# Patient Record
Sex: Female | Born: 1955
Health system: Southern US, Community
[De-identification: ages and names within clinical notes are randomized; demographics above are authoritative.]

## PROBLEM LIST (undated history)

## (undated) DIAGNOSIS — G4700 Insomnia, unspecified: Secondary | ICD-10-CM

## (undated) DIAGNOSIS — I10 Essential (primary) hypertension: Secondary | ICD-10-CM

## (undated) DIAGNOSIS — K219 Gastro-esophageal reflux disease without esophagitis: Secondary | ICD-10-CM

## (undated) DIAGNOSIS — R51 Headache: Secondary | ICD-10-CM

## (undated) DIAGNOSIS — T8859XA Other complications of anesthesia, initial encounter: Secondary | ICD-10-CM

## (undated) DIAGNOSIS — T4145XA Adverse effect of unspecified anesthetic, initial encounter: Secondary | ICD-10-CM

## (undated) DIAGNOSIS — E039 Hypothyroidism, unspecified: Secondary | ICD-10-CM

## (undated) DIAGNOSIS — M1990 Unspecified osteoarthritis, unspecified site: Secondary | ICD-10-CM

## (undated) DIAGNOSIS — F32A Depression, unspecified: Secondary | ICD-10-CM

## (undated) DIAGNOSIS — R112 Nausea with vomiting, unspecified: Secondary | ICD-10-CM

## (undated) DIAGNOSIS — R350 Frequency of micturition: Secondary | ICD-10-CM

## (undated) DIAGNOSIS — F329 Major depressive disorder, single episode, unspecified: Secondary | ICD-10-CM

## (undated) DIAGNOSIS — I341 Nonrheumatic mitral (valve) prolapse: Secondary | ICD-10-CM

## (undated) DIAGNOSIS — Z9889 Other specified postprocedural states: Secondary | ICD-10-CM

## (undated) HISTORY — PX: OTHER SURGICAL HISTORY: SHX169

## (undated) HISTORY — DX: Gastro-esophageal reflux disease without esophagitis: K21.9

## (undated) HISTORY — DX: Frequency of micturition: R35.0

## (undated) HISTORY — DX: Headache: R51

## (undated) HISTORY — PX: TUBAL LIGATION: SHX77

## (undated) HISTORY — DX: Insomnia, unspecified: G47.00

## (undated) HISTORY — PX: POLYPECTOMY: SHX149

## (undated) HISTORY — DX: Nonrheumatic mitral (valve) prolapse: I34.1

---

## 1999-10-17 HISTORY — PX: BREAST BIOPSY: SHX20

## 1999-10-17 HISTORY — PX: BREAST SURGERY: SHX581

## 2000-10-16 DIAGNOSIS — R112 Nausea with vomiting, unspecified: Secondary | ICD-10-CM

## 2000-10-16 DIAGNOSIS — Z9889 Other specified postprocedural states: Secondary | ICD-10-CM

## 2000-10-16 HISTORY — DX: Other specified postprocedural states: Z98.890

## 2000-10-16 HISTORY — DX: Other specified postprocedural states: R11.2

## 2001-10-16 DIAGNOSIS — I341 Nonrheumatic mitral (valve) prolapse: Secondary | ICD-10-CM

## 2001-10-16 HISTORY — DX: Nonrheumatic mitral (valve) prolapse: I34.1

## 2005-08-01 ENCOUNTER — Ambulatory Visit: Payer: Self-pay | Admitting: General Practice

## 2005-08-10 ENCOUNTER — Ambulatory Visit: Payer: Self-pay | Admitting: General Practice

## 2005-10-16 HISTORY — PX: KNEE ARTHROPLASTY: SHX992

## 2006-01-05 ENCOUNTER — Inpatient Hospital Stay: Payer: Self-pay | Admitting: General Practice

## 2006-10-16 HISTORY — PX: JOINT REPLACEMENT: SHX530

## 2006-10-16 HISTORY — PX: KNEE ARTHROPLASTY: SHX992

## 2007-04-09 ENCOUNTER — Ambulatory Visit: Payer: Self-pay | Admitting: General Practice

## 2007-04-22 ENCOUNTER — Inpatient Hospital Stay: Payer: Self-pay | Admitting: General Practice

## 2009-03-22 ENCOUNTER — Ambulatory Visit: Payer: Self-pay | Admitting: Unknown Physician Specialty

## 2012-09-23 ENCOUNTER — Encounter: Payer: Self-pay | Admitting: Adult Health

## 2012-09-23 ENCOUNTER — Telehealth: Payer: Self-pay | Admitting: *Deleted

## 2012-09-23 ENCOUNTER — Telehealth: Payer: Self-pay | Admitting: Adult Health

## 2012-09-23 ENCOUNTER — Ambulatory Visit (INDEPENDENT_AMBULATORY_CARE_PROVIDER_SITE_OTHER): Payer: BC Managed Care – PPO | Admitting: Adult Health

## 2012-09-23 VITALS — BP 132/91 | HR 64 | Temp 97.8°F | Ht 64.0 in | Wt 212.0 lb

## 2012-09-23 DIAGNOSIS — Z23 Encounter for immunization: Secondary | ICD-10-CM

## 2012-09-23 DIAGNOSIS — R071 Chest pain on breathing: Secondary | ICD-10-CM

## 2012-09-23 DIAGNOSIS — R52 Pain, unspecified: Secondary | ICD-10-CM

## 2012-09-23 DIAGNOSIS — G4700 Insomnia, unspecified: Secondary | ICD-10-CM | POA: Insufficient documentation

## 2012-09-23 DIAGNOSIS — K219 Gastro-esophageal reflux disease without esophagitis: Secondary | ICD-10-CM

## 2012-09-23 DIAGNOSIS — R1011 Right upper quadrant pain: Secondary | ICD-10-CM

## 2012-09-23 DIAGNOSIS — M546 Pain in thoracic spine: Secondary | ICD-10-CM | POA: Insufficient documentation

## 2012-09-23 LAB — LIPID PANEL
Cholesterol: 200 mg/dL (ref 0–200)
Total CHOL/HDL Ratio: 4
Triglycerides: 101 mg/dL (ref 0.0–149.0)

## 2012-09-23 LAB — COMPREHENSIVE METABOLIC PANEL
ALT: 14 U/L (ref 0–35)
CO2: 28 mEq/L (ref 19–32)
Calcium: 9.2 mg/dL (ref 8.4–10.5)
Chloride: 101 mEq/L (ref 96–112)
Creatinine, Ser: 0.9 mg/dL (ref 0.4–1.2)
GFR: 66.9 mL/min (ref >60.00)
Glucose, Bld: 89 mg/dL (ref 70–99)

## 2012-09-23 LAB — CBC WITH DIFFERENTIAL/PLATELET
Basophils Absolute: 0 10*3/uL (ref 0.0–0.1)
Eosinophils Absolute: 0.2 10*3/uL (ref 0.0–0.7)
Lymphocytes Relative: 20.7 % (ref 12.0–46.0)
MCHC: 33.2 g/dL (ref 30.0–36.0)
MCV: 89.5 fl (ref 78.0–100.0)
Monocytes Absolute: 0.4 10*3/uL (ref 0.1–1.0)
Neutrophils Relative %: 70.7 % (ref 43.0–77.0)
Platelets: 251 10*3/uL (ref 150.0–400.0)
RDW: 13.2 % (ref 11.5–14.6)

## 2012-09-23 MED ORDER — TETANUS-DIPHTH-ACELL PERTUSSIS 5-2.5-18.5 LF-MCG/0.5 IM SUSP
0.5000 mL | Freq: Once | INTRAMUSCULAR | Status: AC
  Administered 2012-09-23: 0.5 mL via INTRAMUSCULAR

## 2012-09-23 MED ORDER — ZOLPIDEM TARTRATE 5 MG PO TABS: 5.0000 mg | ORAL_TABLET | Freq: Every evening | ORAL | Status: DC | PRN

## 2012-09-23 NOTE — Progress Notes (Signed)
  Subjective:    Patient ID: Denise Glass, female    DOB: [birth year], 56 y.o.   MRN: [id number]  HPI  Patient is a very pleasant 56 y/o female who presents today to establish care. She does not take any medications. She voices a few symptoms (chest pain with inspiration, back pain also with inspiration, abdominal pain, insomnia) which she wishes to address during this visit. Patient is followed by Dr. Luella Cook for her yearly pelvic/breast exam.   No medications taken. Occassionally takes Vit D    Review of Systems  Constitutional: Negative for fever, chills, activity change, appetite change and fatigue.  HENT: Negative.   Eyes: Negative.   Respiratory: Negative for cough and shortness of breath.        Chest pain with inspiration  Cardiovascular:       Chest pain with inspiration radiating to back  Gastrointestinal: Positive for abdominal pain. Negative for nausea, diarrhea and constipation.  Genitourinary: Negative for dysuria and pelvic pain.  Musculoskeletal: Positive for back pain.  Skin: Negative for rash.  Neurological: Negative for dizziness, syncope, light-headedness and headaches.  Psychiatric/Behavioral: Negative.     BP 132/91  Pulse 64  Temp 97.8 F (36.6 C) (Oral)  Ht 5\' 4"  (1.626 m)  Wt 212 lb (96.163 kg)  BMI 36.39 kg/m2  SpO2 99%     Objective:   Physical Exam  Constitutional: She is oriented to person, place, and time. She appears well-developed and well-nourished. No distress.  Cardiovascular: Regular rhythm and normal heart sounds.        Bradycardia  Pulmonary/Chest: Breath sounds normal.  Abdominal: There is tenderness. There is no rebound.  Lymphadenopathy:    She has no cervical adenopathy.  Neurological: She is alert and oriented to person, place, and time.  Skin: Skin is warm and dry. No rash noted.  Psychiatric: She has a normal mood and affect. Her behavior is normal. Judgment and thought content normal.            Assessment & Plan:

## 2012-09-23 NOTE — Assessment & Plan Note (Signed)
History of GERD. Symptoms have improved since losing 65 lbs. Not currently taking any medications. Consider H2 blocker as some of her symptoms could be from GERD.

## 2012-09-23 NOTE — Assessment & Plan Note (Signed)
Suspect musculoskeletal in nature. She has tenderness on palpation to area below scapula in thoracic spine area. 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

## 2012-09-23 NOTE — Assessment & Plan Note (Signed)
Uncertain why she is having chest pain on inspiration. Possibly musculoskeletal although the patient does not remember any activity to precipitate this. She does care for her grandson 2 days per week and she may have some costochondritis which is producing these symptoms. EKG ordered to r/o cardiac abnormality. Also ordered chest xray.

## 2012-09-23 NOTE — Patient Instructions (Addendum)
Congratulations on your weight loss! Keep up the good work.   Please have labs drawn before you leave the office today.  We will schedule your chest xray and the abdominal ultrasound at [facility].  I have [facility] [place]. Take this medication as needed for sleep. Take it right before you are ready for sleep.  Results can be viewed through MyChart. Instructions on how to activate are listed below.  Follow up in 2-3 weeks or sooner if needed.

## 2012-09-23 NOTE — Telephone Encounter (Signed)
Already spoke to [place] regarding not adding CEA to patient's labs.

## 2012-09-23 NOTE — Assessment & Plan Note (Addendum)
Difficulty staying asleep. Patient has tried [place] in the past with good results. We will try this again. Prescription faxed to pharmacy.

## 2012-09-23 NOTE — Telephone Encounter (Signed)
Pt is needing refill on Ambien [facility] [place].

## 2012-09-23 NOTE — Assessment & Plan Note (Addendum)
Diffuse abdominal pain. Epigastric with some radiation to right upper quadrant. Ultrasound ordered.

## 2012-09-23 NOTE — Telephone Encounter (Signed)
Pt wants to know if she can get a CEA added to her labs

## 2012-09-25 ENCOUNTER — Ambulatory Visit: Payer: Self-pay

## 2012-09-26 ENCOUNTER — Telehealth: Payer: Self-pay | Admitting: Adult Health

## 2012-09-26 NOTE — Telephone Encounter (Signed)
Pt was calling and wanting to know if we have recived teh results from her Chest x-ray and CT scan. I looked and did not see them but I wanted to make sure you didn't see any results either.

## 2012-09-27 ENCOUNTER — Ambulatory Visit (INDEPENDENT_AMBULATORY_CARE_PROVIDER_SITE_OTHER)
Admission: RE | Admit: 2012-09-27 | Discharge: 2012-09-27 | Disposition: A | Discharge status: Home / Self Care | Payer: BC Managed Care – PPO | Source: Ambulatory Visit | Attending: Adult Health | Admitting: Adult Health

## 2012-09-27 ENCOUNTER — Other Ambulatory Visit: Payer: Self-pay | Admitting: Adult Health

## 2012-09-27 DIAGNOSIS — R911 Solitary pulmonary nodule: Secondary | ICD-10-CM

## 2012-10-02 NOTE — Telephone Encounter (Signed)
Copies of report mailed to patient

## 2012-10-11 ENCOUNTER — Encounter: Payer: Self-pay | Admitting: Adult Health

## 2013-06-02 ENCOUNTER — Other Ambulatory Visit: Payer: Self-pay | Admitting: *Deleted

## 2013-06-02 DIAGNOSIS — G4700 Insomnia, unspecified: Secondary | ICD-10-CM

## 2013-06-02 MED ORDER — ZOLPIDEM TARTRATE 5 MG PO TABS: 5.0000 mg | ORAL_TABLET | Freq: Every evening | ORAL | Status: DC | PRN

## 2013-06-02 NOTE — Telephone Encounter (Signed)
Rx faxed to pharmacy  

## 2014-01-22 ENCOUNTER — Other Ambulatory Visit: Payer: Self-pay | Admitting: Adult Health

## 2014-01-22 NOTE — Telephone Encounter (Signed)
Ok to fill 

## 2014-01-23 ENCOUNTER — Telehealth: Payer: Self-pay | Admitting: Adult Health

## 2014-01-23 NOTE — Telephone Encounter (Signed)
Faxed Rx to pharmacy, notified pt

## 2014-01-23 NOTE — Telephone Encounter (Signed)
Ambien refill sent for 1 month. She needs an appointment. She hasn't been to the office since 09/2012.

## 2014-04-06 ENCOUNTER — Ambulatory Visit (INDEPENDENT_AMBULATORY_CARE_PROVIDER_SITE_OTHER): Payer: BC Managed Care – PPO | Admitting: Adult Health

## 2014-04-06 ENCOUNTER — Encounter: Payer: Self-pay | Admitting: Adult Health

## 2014-04-06 VITALS — BP 129/91 | HR 75 | Temp 98.1°F | Resp 14 | Wt 234.5 lb

## 2014-04-06 DIAGNOSIS — Z0000 Encounter for general adult medical examination without abnormal findings: Secondary | ICD-10-CM

## 2014-04-06 DIAGNOSIS — G479 Sleep disorder, unspecified: Secondary | ICD-10-CM | POA: Insufficient documentation

## 2014-04-06 DIAGNOSIS — R5381 Other malaise: Secondary | ICD-10-CM

## 2014-04-06 DIAGNOSIS — R5383 Other fatigue: Secondary | ICD-10-CM | POA: Insufficient documentation

## 2014-04-06 LAB — CBC
HEMATOCRIT: 42.7 % (ref 36.0–46.0)
HEMOGLOBIN: 14.5 g/dL (ref 12.0–15.0)
MCHC: 33.9 g/dL (ref 30.0–36.0)
MCV: 88.3 fl (ref 78.0–100.0)
PLATELETS: 291 10*3/uL (ref 150.0–400.0)
RBC: 4.84 Mil/uL (ref 3.87–5.11)
RDW: 13.9 % (ref 11.5–15.5)
WBC: 7.2 10*3/uL (ref 4.0–10.5)

## 2014-04-06 LAB — LIPID PANEL
CHOL/HDL RATIO: 3
Cholesterol: 209 mg/dL — ABNORMAL HIGH (ref 0–200)
HDL: 61.1 mg/dL (ref >39.00)
LDL Cholesterol: 124 mg/dL — ABNORMAL HIGH (ref 0–99)
NONHDL: 147.9
TRIGLYCERIDES: 120 mg/dL (ref 0.0–149.0)
VLDL: 24 mg/dL (ref 0.0–40.0)

## 2014-04-06 LAB — COMPREHENSIVE METABOLIC PANEL
ALT: 15 U/L (ref 0–35)
AST: 21 U/L (ref 0–37)
Albumin: 4.2 g/dL (ref 3.5–5.2)
Alkaline Phosphatase: 95 U/L (ref 39–117)
BILIRUBIN TOTAL: 0.6 mg/dL (ref 0.2–1.2)
BUN: 9 mg/dL (ref 6–23)
CO2: 29 mEq/L (ref 19–32)
CREATININE: 1 mg/dL (ref 0.4–1.2)
Calcium: 10.1 mg/dL (ref 8.4–10.5)
Chloride: 103 mEq/L (ref 96–112)
GFR: 62.6 mL/min (ref >60.00)
Glucose, Bld: 94 mg/dL (ref 70–99)
Potassium: 3.9 mEq/L (ref 3.5–5.1)
Sodium: 140 mEq/L (ref 135–145)
Total Protein: 7.3 g/dL (ref 6.0–8.3)

## 2014-04-06 LAB — TSH: TSH: 2.99 u[IU]/mL (ref 0.35–4.50)

## 2014-04-06 MED ORDER — ZOLPIDEM TARTRATE 5 MG PO TABS: 5.0000 mg | ORAL_TABLET | Freq: Every evening | ORAL | Status: DC | PRN

## 2014-04-06 NOTE — Patient Instructions (Signed)
   Please have your labs drawn prior to leaving the office.  The results will be available through MyChart for your convenience. Please remember to activate this. The activation code is located at the end of this form.  I have provided you with a prescription refill on Ambien.  Have a wonderful time in [place].

## 2014-04-06 NOTE — Progress Notes (Signed)
Subjective:    Patient ID: Denise Glass, female    DOB: 1956/08/08, 58 y.o.   MRN: [id number]  HPI  Pleasant 58 yo caucasian female with history of mitral valve prolapse presents today for feeling fatigue and check-up before a trip to [place] on Labor Day. She is going for 10 days and has been there previously.  Indicates about a week and half ago was walking across the street to a neighbors house when she became fatigued. She has been exercising and was concerned. It took about 2 days to get over the fatigue. Continues to feel a small amount of fatigue. Started taking Vitamin B complex supplement which has helped. Denies chest pain or shortness of breath.    Past Medical History  Diagnosis Date  . Mitral valve prolapse 2003    Followed by Dr. Clayborn Bigness (prn)     Past Surgical History  Procedure Laterality Date  . Breast surgery  2001    biopsy  . Joint replacement  2008    left and right knee replaced  . Breast biopsy  2001    Calcification - bx done by Dr. Raylene Everts  . Knee arthroplasty  2007    Right Knee  . Knee arthroplasty  2008    Left Knee     Family History  Problem Relation Age of Onset  . Heart disease Father   . COPD Father   . Cancer Father   . COPD Mother   . Cancer Cousin      History   Social History  . Marital Status: Single    Spouse Name: N/A    Number of Children: N/A  . Years of Education: N/A   Occupational History  . Not on file.   Social History Main Topics  . Smoking status: Never Smoker   . Smokeless tobacco: Not on file  . Alcohol Use: No  . Drug Use: No  . Sexual Activity: Not on file   Other Topics Concern  . Not on file   Social History Narrative   Patient is a 58 y/o widow (2011) who lives independently at home. She has a Retail buyer several properties. She has one son, a daughter-in-law and a grandson that live locally and they share a very good relationship.     Review of Systems    Constitutional: Positive for fatigue.  HENT: Negative.   Eyes: Negative.   Respiratory: Negative.  Negative for chest tightness and shortness of breath.   Cardiovascular: Negative.  Negative for chest pain.  Gastrointestinal: Negative.   Endocrine: Negative.   Genitourinary: Negative.   Musculoskeletal: Negative.   Allergic/Immunologic: Negative.   Neurological: Negative.   Hematological: Negative.   Psychiatric/Behavioral: Negative.        Objective:  BP 129/91  Pulse 75  Temp(Src) 98.1 F (36.7 C) (Oral)  Resp 14  Wt 234 lb 8 oz (106.369 kg)  SpO2 97%   Physical Exam  Constitutional: She is oriented to person, place, and time. No distress.  Overweight, pleasant female  HENT:  Head: Normocephalic and atraumatic.  Right Ear: External ear normal.  Left Ear: External ear normal.  Nose: Nose normal.  Mouth/Throat: Oropharynx is clear and moist.  Eyes: Conjunctivae and EOM are normal. Pupils are equal, round, and reactive to light.  Neck: Normal range of motion. Neck supple.  Cardiovascular: Normal rate, regular rhythm, normal heart sounds and intact distal pulses.   Pulmonary/Chest: Effort normal and breath sounds normal.  Abdominal: Soft. Bowel sounds are normal.  Musculoskeletal: Normal range of motion.  Neurological: She is alert and oriented to person, place, and time. She has normal reflexes.  Skin: Skin is warm and dry.  Psychiatric: She has a normal mood and affect. Her behavior is normal. Judgment and thought content normal.      Assessment & Plan:   1. Routine general medical examination at a health care facility Normal physical exam. Screenings addressed with pt. Labs ordered. She will have mammogram done through [facility]. Report to be sent to my office.  2. Other fatigue Check labs. She has started taking vitamins and reports feeling better. Follow - CBC - Comp Met (CMET) - Lipid Profile - TSH  3. Sleep disturbance [employer] as needed. Does not take  every night. Refill ambien #30, Refill 1

## 2014-04-06 NOTE — Progress Notes (Signed)
Pre visit review using our clinic review tool, if applicable. No additional management support is needed unless otherwise documented below in the visit note. 

## 2014-04-07 ENCOUNTER — Encounter: Payer: Self-pay | Admitting: *Deleted

## 2014-04-28 LAB — HM MAMMOGRAPHY

## 2014-06-01 ENCOUNTER — Ambulatory Visit (INDEPENDENT_AMBULATORY_CARE_PROVIDER_SITE_OTHER): Payer: BC Managed Care – PPO | Admitting: Internal Medicine

## 2014-06-01 ENCOUNTER — Encounter: Payer: Self-pay | Admitting: Internal Medicine

## 2014-06-01 ENCOUNTER — Other Ambulatory Visit (HOSPITAL_COMMUNITY)
Admission: RE | Admit: 2014-06-01 | Discharge: 2014-06-01 | Disposition: A | Discharge status: Home / Self Care | Payer: BC Managed Care – PPO | Source: Ambulatory Visit | Attending: Internal Medicine | Admitting: Internal Medicine

## 2014-06-01 VITALS — BP 138/100 | HR 77 | Temp 98.0°F | Resp 16 | Ht 62.0 in | Wt 229.0 lb

## 2014-06-01 DIAGNOSIS — Z01419 Encounter for gynecological examination (general) (routine) without abnormal findings: Secondary | ICD-10-CM | POA: Insufficient documentation

## 2014-06-01 DIAGNOSIS — N952 Postmenopausal atrophic vaginitis: Secondary | ICD-10-CM

## 2014-06-01 DIAGNOSIS — R300 Dysuria: Secondary | ICD-10-CM

## 2014-06-01 DIAGNOSIS — Z124 Encounter for screening for malignant neoplasm of cervix: Secondary | ICD-10-CM

## 2014-06-01 LAB — POCT URINALYSIS DIPSTICK
Bilirubin, UA: NEGATIVE
GLUCOSE UA: NEGATIVE
Ketones, UA: NEGATIVE
Leukocytes, UA: NEGATIVE
NITRITE UA: NEGATIVE
PROTEIN UA: NEGATIVE
Spec Grav, UA: >=1.03
Urobilinogen, UA: 0.2
pH, UA: 5

## 2014-06-01 MED ORDER — ESTROGENS, CONJUGATED 0.625 MG/GM VA CREA: 1.0000 | TOPICAL_CREAM | Freq: Every day | VAGINAL | Status: DC

## 2014-06-01 MED ORDER — CIPROFLOXACIN HCL 250 MG PO TABS: 250.0000 mg | ORAL_TABLET | Freq: Two times a day (BID) | ORAL | Status: DC

## 2014-06-01 MED ORDER — OXYBUTYNIN CHLORIDE ER 10 MG PO TB24: 10.0000 mg | ORAL_TABLET | Freq: Every day | ORAL | Status: DC

## 2014-06-01 NOTE — Patient Instructions (Addendum)
Your symptoms do not appear to be due to an infection but may be due to either atrophic vaginitis or due to overactive bladder   We will send your urine off for analysis because of the "blood" and will notify you of the details  I am prescribing vaginal estrogen to try for a few weeks  You can also try the generic overactive bladder medication (oxybutynin) , but it has side effects and may aggravate the vaginal symptoms  Atrophic Vaginitis Atrophic vaginitis is a problem of low levels of estrogen in women. This problem can happen at any age. It is most common in women who have gone through menopause ("the change").  HOW WILL I KNOW IF I HAVE THIS PROBLEM? You may have:  Trouble with peeing (urinating), such as:  Going to the bathroom often.  A hard time holding your pee until you reach a bathroom.  Leaking pee.  Having pain when you pee.  Itching or a burning feeling.  Vaginal bleeding and spotting.  Pain during sex.  Dryness of the vagina.  A yellow, bad-smelling fluid (discharge) coming from the vagina. HOW WILL MY DOCTOR CHECK FOR THIS PROBLEM?  During your exam, your doctor will likely find the problem.  If there is a vaginal fluid, it may be checked for infection. HOW WILL THIS PROBLEM BE TREATED? Keep the vulvar skin as clean as possible. Moisturizers and lubricants can help with some of the symptoms. Estrogen replacement can help. There are 2 ways to take estrogen:  Systemic estrogen gets estrogen to your whole body. It takes many weeks or months before the symptoms get better.  You take an estrogen pill.  You use a skin patch. This is a patch that you put on your skin.  If you still have your uterus, your doctor may ask you to take a hormone. Talk to your doctor about the right medicine for you.  Estrogen cream.  This puts estrogen only at the part of your body where you apply it. The cream is put into the vagina or put on the vulvar skin. For some women,  estrogen cream works faster than pills or the patch. CAN ALL WOMEN WITH THIS PROBLEM USE ESTROGEN? No. Women with certain types of cancer, liver problems, or problems with blood clots should not take estrogen. Your doctor can help you decide the best treatment for your symptoms. Document Released: 03/20/2008 Document Revised: 10/07/2013 Document Reviewed: 03/20/2008 [facility] Patient Information 2015 [place], [place]. This information is not intended to replace advice given to you by your health care provider. Make sure you discuss any questions you have with your health care provider.

## 2014-06-01 NOTE — Progress Notes (Addendum)
Patient ID: Denise Glass, female   DOB: 07/08/1956, 58 y.o.   MRN: [id number]   Patient Active Problem List   Diagnosis Date Noted  . Postmenopausal atrophic vaginitis 06/02/2014  . Routine general medical examination at a health care facility 04/06/2014  . Other fatigue 04/06/2014  . Sleep disturbance 04/06/2014  . Chest pain on breathing 09/23/2012  . GERD (gastroesophageal reflux disease) 09/23/2012  . Insomnia 09/23/2012  . Back pain, thoracic 09/23/2012  . Abdominal pain, acute, right upper quadrant 09/23/2012  . Need for Tdap vaccination 09/23/2012    Subjective:  CC:   Chief Complaint  Patient presents with  . Urinary Tract Infection    pressure in bladder. urgency    HPI:   Denise Glass is a 58 y.o. female who presents for  Bladder pressure  For the past 3 weeks.  Notes that she empties  bladder, then has urgency just 30 minutes later. Feels that her urethra is recurrently irritated. She is Post menopausal with No prior use of vaginal estrogen.  Notes dyspareunia due to dryness.,    Past Medical History  Diagnosis Date  . Mitral valve prolapse 2003    Followed by Dr. Clayborn Bigness (prn)    Past Surgical History  Procedure Laterality Date  . Breast surgery  2001    biopsy  . Joint replacement  2008    left and right knee replaced  . Breast biopsy  2001    Calcification - bx done by Dr. Raylene Everts  . Knee arthroplasty  2007    Right Knee  . Knee arthroplasty  2008    Left Knee       The following portions of the patient's history were reviewed and updated as appropriate: Allergies, current medications, and problem list.    Review of Systems:   Patient denies headache, fevers, malaise, unintentional weight loss, skin rash, eye pain, sinus congestion and sinus pain, sore throat, dysphagia,  hemoptysis , cough, dyspnea, wheezing, chest pain, palpitations, orthopnea, edema, abdominal pain, nausea, melena, diarrhea, constipation, flank pain, dysuria,  hematuria, urinary  Frequency, nocturia, numbness, tingling, seizures,  Focal weakness, Loss of consciousness,  Tremor, insomnia, depression, anxiety, and suicidal ideation.     History   Social History  . Marital Status: Single    Spouse Name: N/A    Number of Children: N/A  . Years of Education: N/A   Occupational History  . Not on file.   Social History Main Topics  . Smoking status: Never Smoker   . Smokeless tobacco: Not on file  . Alcohol Use: No  . Drug Use: No  . Sexual Activity: Not on file   Other Topics Concern  . Not on file   Social History Narrative   Patient is a 58 y/o widow (2011) who lives independently at home. She has a Retail buyer several properties. She has one son, a daughter-in-law and a grandson that live locally and they share a very good relationship.    Objective:  Filed Vitals:   06/01/14 1826  BP: 138/100  Pulse: 77  Temp: 98 F (36.7 C)  Resp: 16     General appearance: alert, cooperative and appears stated age Ears: normal TM's and external ear canals both ears Throat: lips, mucosa, and tongue normal; teeth and gums normal Neck: no adenopathy, no carotid bruit, supple, symmetrical, trachea midline and thyroid not enlarged, symmetric, no tenderness/mass/nodules Back: symmetric, no curvature. ROM normal. No CVA tenderness. Lungs: clear to  auscultation bilaterally Heart: regular rate and rhythm, S1, S2 normal, no murmur, click, rub or gallop Abdomen: soft, non-tender; bowel sounds normal; no masses,  no organomegaly Pulses: 2+ and symmetric GYM: vaginal walls pale, no discharge. PAP smear done  Skin: Skin color, texture, turgor normal. No rashes or lesions Lymph nodes: Cervical, supraclavicular, and axillary nodes normal.  Assessment and Plan:  Postmenopausal atrophic vaginitis Suggested by history , exam and normal UA.  Trial of estrace.    Updated Medication List Outpatient Encounter Prescriptions as of  06/01/2014  Medication Sig  . zolpidem (AMBIEN) 5 MG tablet Take 1 tablet (5 mg total) by mouth at bedtime as needed for sleep.  . B Complex-C (SUPER B COMPLEX PO) Take 1 tablet by mouth daily.  . ciprofloxacin (CIPRO) 250 MG tablet Take 1 tablet (250 mg total) by mouth 2 (two) times daily.  [place] conjugated estrogens (PREMARIN) vaginal cream Place 1 Applicatorful vaginally daily. For two weeks,  Then reduce use to twice weekly  . oxybutynin (DITROPAN-XL) 10 MG 24 hr tablet Take 1 tablet (10 mg total) by mouth at bedtime.     Orders Placed This Encounter  Procedures  . Urine Culture  . Urinalysis, Routine w reflex microscopic  . POCT Urinalysis Dipstick    No Follow-up on file.

## 2014-06-01 NOTE — Progress Notes (Signed)
Pre-visit discussion using our clinic review tool. No additional management support is needed unless otherwise documented below in the visit note.  

## 2014-06-02 ENCOUNTER — Encounter: Payer: Self-pay | Admitting: Internal Medicine

## 2014-06-02 DIAGNOSIS — N952 Postmenopausal atrophic vaginitis: Secondary | ICD-10-CM | POA: Insufficient documentation

## 2014-06-02 NOTE — Assessment & Plan Note (Signed)
Suggested by history , exam and normal UA.  Trial of estrace.

## 2014-06-04 LAB — CYTOLOGY - PAP

## 2014-06-08 ENCOUNTER — Encounter: Payer: Self-pay | Admitting: *Deleted

## 2014-09-03 ENCOUNTER — Other Ambulatory Visit: Payer: Self-pay | Admitting: *Deleted

## 2014-09-03 MED ORDER — OXYBUTYNIN CHLORIDE ER 10 MG PO TB24: 10.0000 mg | ORAL_TABLET | Freq: Every day | ORAL | Status: DC

## 2014-10-14 ENCOUNTER — Other Ambulatory Visit: Payer: Self-pay | Admitting: Internal Medicine

## 2014-10-14 NOTE — Telephone Encounter (Addendum)
Last OV 8.17.15.  Please advise refill

## 2014-10-14 NOTE — Telephone Encounter (Signed)
Please set her up with [facility] going forward.

## 2014-10-16 DIAGNOSIS — R519 Headache, unspecified: Secondary | ICD-10-CM

## 2014-10-16 HISTORY — DX: Headache, unspecified: R51.9

## 2015-01-17 ENCOUNTER — Emergency Department: Admit: 2015-01-17 | Discharge status: Against Medical Advice | Payer: Self-pay | Admitting: Internal Medicine

## 2015-01-17 LAB — COMPREHENSIVE METABOLIC PANEL
ANION GAP: 7 (ref 7–16)
AST: 20 U/L
Albumin: 4.1 g/dL
Alkaline Phosphatase: 108 U/L
BUN: 13 mg/dL
Bilirubin,Total: 0.5 mg/dL
CALCIUM: 10.2 mg/dL
CO2: 29 mmol/L
Chloride: 104 mmol/L
Creatinine: 0.86 mg/dL
GLUCOSE: 107 mg/dL — AB
Potassium: 4.5 mmol/L
SGPT (ALT): 13 U/L — ABNORMAL LOW
SODIUM: 140 mmol/L
Total Protein: 7.5 g/dL

## 2015-01-17 LAB — CBC
HCT: 41.9 % (ref 35.0–47.0)
HGB: 14.2 g/dL (ref 12.0–16.0)
MCH: 29.9 pg (ref 26.0–34.0)
MCHC: 33.8 g/dL (ref 32.0–36.0)
MCV: 89 fL (ref 80–100)
Platelet: 265 10*3/uL (ref 150–440)
RBC: 4.74 10*6/uL (ref 3.80–5.20)
RDW: 13.5 % (ref 11.5–14.5)
WBC: 7.5 10*3/uL (ref 3.6–11.0)

## 2015-01-17 LAB — TROPONIN I

## 2015-02-01 ENCOUNTER — Encounter: Payer: Self-pay | Admitting: Nurse Practitioner

## 2015-02-01 ENCOUNTER — Ambulatory Visit (INDEPENDENT_AMBULATORY_CARE_PROVIDER_SITE_OTHER): Payer: BLUE CROSS/BLUE SHIELD | Admitting: Nurse Practitioner

## 2015-02-01 DIAGNOSIS — R9431 Abnormal electrocardiogram [ECG] [EKG]: Secondary | ICD-10-CM

## 2015-02-01 DIAGNOSIS — R0602 Shortness of breath: Secondary | ICD-10-CM | POA: Insufficient documentation

## 2015-02-01 LAB — BRAIN NATRIURETIC PEPTIDE: Pro B Natriuretic peptide (BNP): 22 pg/mL (ref 0.0–100.0)

## 2015-02-01 NOTE — Patient Instructions (Signed)
We will call you about your referral to Dr. Clayborn Bigness   Please seek emergency care if having symptoms of chest pain and SOB that do not relieve.

## 2015-02-01 NOTE — Assessment & Plan Note (Signed)
[place] 01/17/15-  Bradycardia 42 bpm  Possible left atria enlargement.   Pt left AMA from ER on 01/17/15

## 2015-02-01 NOTE — Progress Notes (Signed)
Subjective:    Patient ID: Denise Glass, female    DOB: [birth year], 59 y.o.   MRN: [id number]  HPI  Denise Glass is a 59 yo female with a CC of ER follow up.   1) Went to ER on 01/17/15  EKG shows bradycardia 42 bpm and possible left atrial enlargement    Labs- CMET 107 glu, ALT 13 low, normal troponin, normal CBC w/ diff   Chest x-ray normal  Feels like someone is blowing up a balloon and is SOB when it happens. Was at [facility] and EMS was called. 1-1.5 min and then goes away, 2 episodes that day in church. Usually happens at night at 11 pm she reports.  Today she reports:   Taking the Nexium. This has been going on since March. Fatigue for 1 month. Feels she is breathing shallow.   Review of Systems  Constitutional: Positive for fatigue. Negative for fever, chills and diaphoresis.  HENT: Positive for sinus pressure. Negative for trouble swallowing.        Resolves with advil  Respiratory: Positive for chest tightness and shortness of breath. Negative for cough and wheezing.   Cardiovascular: Negative for chest pain, palpitations and leg swelling.  Gastrointestinal: Negative for nausea, vomiting, diarrhea and constipation.  Skin: Negative for rash.  Neurological: Negative for dizziness, weakness, numbness and headaches.  Psychiatric/Behavioral: The patient is nervous/anxious.    Past Medical History  Diagnosis Date  . Mitral valve prolapse 2003    Followed by Dr. Clayborn Bigness (prn)    History   Social History  . Marital Status: Widowed    Spouse Name: N/A  . Number of Children: N/A  . Years of Education: N/A   Occupational History  . Not on file.   Social History Main Topics  . Smoking status: Never Smoker   . Smokeless tobacco: Not on file  . Alcohol Use: No  . Drug Use: No  . Sexual Activity: Not on file   Other Topics Concern  . Not on file   Social History Narrative   Patient is a 59 y/o widow (2011) who lives independently at home. She has a Physicist, medical several properties. She has one son, a daughter-in-law and a grandson that live locally and they share a very good relationship.    Past Surgical History  Procedure Laterality Date  . Breast surgery  2001    biopsy  . Joint replacement  2008    left and right knee replaced  . Breast biopsy  2001    Calcification - bx done by Dr. Raylene Everts  . Knee arthroplasty  2007    Right Knee  . Knee arthroplasty  2008    Left Knee    Family History  Problem Relation Age of Onset  . Heart disease Father   . COPD Father   . Cancer Father   . COPD Mother   . Cancer Cousin     Allergies  Allergen Reactions  . Sulfa Antibiotics     Current Outpatient Prescriptions on File Prior to Visit  Medication Sig Dispense Refill  . ciprofloxacin (CIPRO) 250 MG tablet Take 1 tablet (250 mg total) by mouth 2 (two) times daily. 6 tablet 0  . zolpidem (AMBIEN) 5 MG tablet TAKE 1 TABLET (5 MG TOTAL) BY MOUTH AT BEDTIME AS NEEDED FOR SLEEP 30 tablet 3   No current facility-administered medications on file prior to visit.        Objective:  Physical Exam  Constitutional: She is oriented to person, place, and time. She appears well-developed and well-nourished. No distress.  BP 118/80 mmHg  Pulse 94  Temp(Src) 97.6 F (36.4 C) (Oral)  Resp 14  Ht 5\' 2"  (1.575 m)  Wt 239 lb 1.9 oz (108.464 kg)  BMI 43.72 kg/m2  SpO2 97%   HENT:  Head: Normocephalic and atraumatic.  Right Ear: External ear normal.  Left Ear: External ear normal.  Cardiovascular: Normal rate, regular rhythm and normal heart sounds.  Exam reveals no gallop and no friction rub.   No murmur heard. Pulmonary/Chest: Effort normal and breath sounds normal. No respiratory distress. She has no wheezes. She has no rales. She exhibits no tenderness.  Neurological: She is alert and oriented to person, place, and time.  Skin: Skin is warm and dry. No rash noted. She is not diaphoretic.  Psychiatric: Her behavior is normal.  Judgment and thought content normal.  Pt is very tearful       Assessment & Plan:

## 2015-02-01 NOTE — Progress Notes (Signed)
Pre visit review using our clinic review tool, if applicable. No additional management support is needed unless otherwise documented below in the visit note. 

## 2015-02-01 NOTE — Assessment & Plan Note (Addendum)
Pt feels she is shallow breathing. O2 after walking around office went from 96% at start to 94% at finish. She came back up to 97% at rest. Pulse went to 88 and went to 105 with exertion. She was hyperreactive when palpating ribs and would flinch before touching or with very light pressure. She would like a referral back to her past cardiology group with Dr. Clayborn Bigness. Will refer. FU prn worsening/failure to improve.   Discussed seeking emergency care if symptoms re-appear.   Will obtain BNP

## 2015-04-07 ENCOUNTER — Encounter: Payer: Self-pay | Admitting: *Deleted

## 2015-05-18 ENCOUNTER — Telehealth: Payer: Self-pay

## 2015-05-18 NOTE — Telephone Encounter (Signed)
I received a refill request for Oxybutynin 10mg  tablet. Not on current medication list. Please advise

## 2015-05-19 ENCOUNTER — Other Ambulatory Visit: Payer: Self-pay | Admitting: Nurse Practitioner

## 2015-05-19 MED ORDER — OXYBUTYNIN CHLORIDE ER 10 MG PO TB24: 10.0000 mg | ORAL_TABLET | Freq: Every day | ORAL | Status: DC

## 2015-05-19 MED ORDER — DITROPAN XL 10 MG PO TB24: 10.0000 mg | ORAL_TABLET | Freq: Every day | ORAL | Status: DC

## 2015-05-19 NOTE — Telephone Encounter (Signed)
I saw it was d/c'd on her last note. I called it in to the pharmacy if she wants to continue taking. Thanks!

## 2015-05-19 NOTE — Telephone Encounter (Signed)
Unable to leave message for patient

## 2015-06-29 ENCOUNTER — Other Ambulatory Visit: Payer: Self-pay | Admitting: *Deleted

## 2015-06-29 LAB — HM MAMMOGRAPHY

## 2015-06-29 MED ORDER — OXYBUTYNIN CHLORIDE ER 10 MG PO TB24: 10.0000 mg | ORAL_TABLET | Freq: Every day | ORAL | Status: DC

## 2015-07-05 LAB — HM MAMMOGRAPHY: HM MAMMO: NEGATIVE

## 2015-07-08 ENCOUNTER — Other Ambulatory Visit: Payer: Self-pay | Admitting: *Deleted

## 2015-07-08 ENCOUNTER — Telehealth: Payer: Self-pay | Admitting: *Deleted

## 2015-07-08 ENCOUNTER — Telehealth: Payer: Self-pay

## 2015-07-08 MED ORDER — ZOLPIDEM TARTRATE 5 MG PO TABS: ORAL_TABLET | ORAL | Status: DC

## 2015-07-08 NOTE — Telephone Encounter (Signed)
Okay to fill. Ambien 5 mg take 1 tablet at night as needed for sleep #30 with 1 refill.

## 2015-07-08 NOTE — Telephone Encounter (Signed)
Fax from pharmacy requesting Ambien 5 mg.  Last OV 4.18.16.  Please advise refill

## 2015-07-08 NOTE — Telephone Encounter (Signed)
-----   Message from Rubbie Battiest, NP sent at 07/08/2015 10:59 AM EDT ----- Please let pt know that we received her mammogram results and they were normal. Recommended screening every 1-2 years. Thanks!

## 2015-07-08 NOTE — Telephone Encounter (Signed)
rx faxed

## 2015-07-08 NOTE — Telephone Encounter (Signed)
LMTCB about mammogram results

## 2015-07-09 ENCOUNTER — Telehealth: Payer: Self-pay

## 2015-07-09 NOTE — Telephone Encounter (Signed)
Informed pt of mammogram results, pt verbalized understanding

## 2015-07-09 NOTE — Telephone Encounter (Signed)
-----   Message from Rubbie Battiest, NP sent at 07/08/2015 10:59 AM EDT ----- Please let pt know that we received her mammogram results and they were normal. Recommended screening every 1-2 years. Thanks!

## 2015-07-20 DIAGNOSIS — M65332 Trigger finger, left middle finger: Secondary | ICD-10-CM | POA: Insufficient documentation

## 2015-11-25 ENCOUNTER — Ambulatory Visit (INDEPENDENT_AMBULATORY_CARE_PROVIDER_SITE_OTHER): Payer: BLUE CROSS/BLUE SHIELD | Admitting: Nurse Practitioner

## 2015-11-25 ENCOUNTER — Encounter: Payer: Self-pay | Admitting: Nurse Practitioner

## 2015-11-25 VITALS — BP 116/68 | HR 67 | Temp 97.8°F | Resp 14 | Ht 62.0 in | Wt 244.0 lb

## 2015-11-25 DIAGNOSIS — J069 Acute upper respiratory infection, unspecified: Secondary | ICD-10-CM | POA: Diagnosis not present

## 2015-11-25 DIAGNOSIS — M19079 Primary osteoarthritis, unspecified ankle and foot: Secondary | ICD-10-CM

## 2015-11-25 DIAGNOSIS — M129 Arthropathy, unspecified: Secondary | ICD-10-CM

## 2015-11-25 DIAGNOSIS — B9789 Other viral agents as the cause of diseases classified elsewhere: Principal | ICD-10-CM

## 2015-11-25 MED ORDER — INDOMETHACIN 50 MG PO CAPS: 50.0000 mg | ORAL_CAPSULE | Freq: Two times a day (BID) | ORAL | Status: DC

## 2015-11-25 NOTE — Progress Notes (Signed)
Patient ID: Denise Glass, female    DOB: [birth year]  Age: 60 y.o. MRN: [id number]  CC: Follow-up   HPI Denise Glass presents for follow up after UC visit and CC of uri symptoms.   1) Left foot- tender in the instep  Went to [facility] clinic Uric acid level was 5.5  X-ray shows arthritis   Prednisone and tramadol - helpful somewhat  2) Cold symptoms  Last week, walk in on 28th  Bronchitis and cold  Coughing up green Mucinex and minocycline- helpful   History Denise Glass has a past medical history of Mitral valve prolapse (2003).   She has past surgical history that includes Breast surgery (2001); Joint replacement (2008); Breast biopsy (2001); Knee Arthroplasty (2007); and Knee Arthroplasty (2008).   Her family history includes COPD in her father and mother; Cancer in her cousin and father; Heart disease in her father.She reports that she has never smoked. She does not have any smokeless tobacco history on file. She reports that she does not drink alcohol or use illicit drugs.  Outpatient Prescriptions Prior to Visit  Medication Sig Dispense Refill  . esomeprazole (NEXIUM) 10 MG packet Take 10 mg by mouth at bedtime.    Marland Kitchen oxybutynin (DITROPAN-XL) 10 MG 24 hr tablet Take 1 tablet (10 mg total) by mouth at bedtime. 30 tablet 12  . zolpidem (AMBIEN) 5 MG tablet TAKE 1 TABLET (5 MG TOTAL) BY MOUTH AT BEDTIME AS NEEDED FOR SLEEP 30 tablet 1  . ciprofloxacin (CIPRO) 250 MG tablet Take 1 tablet (250 mg total) by mouth 2 (two) times daily. 6 tablet 0   No facility-administered medications prior to visit.    ROS Review of Systems  Constitutional: Positive for fatigue. Negative for fever, chills and diaphoresis.  HENT: Positive for congestion, postnasal drip and rhinorrhea. Negative for sinus pressure, sneezing, sore throat, trouble swallowing and voice change.   Respiratory: Positive for cough. Negative for chest tightness, shortness of breath and wheezing.   Cardiovascular: Negative for  chest pain, palpitations and leg swelling.  Gastrointestinal: Negative for nausea, vomiting and diarrhea.  Musculoskeletal: Positive for myalgias and arthralgias.       Left foot  Skin: Negative for rash.  Neurological: Negative for dizziness and headaches.    Objective:  BP 116/68 mmHg  Pulse 67  Temp(Src) 97.8 F (36.6 C) (Oral)  Resp 14  Ht 5\' 2"  (1.575 m)  Wt 244 lb (110.678 kg)  BMI 44.62 kg/m2  SpO2 98%  Physical Exam  Constitutional: She is oriented to person, place, and time. She appears well-developed and well-nourished. No distress.  HENT:  Head: Normocephalic and atraumatic.  Right Ear: External ear normal.  Left Ear: External ear normal.  Mouth/Throat: No oropharyngeal exudate.  Eyes: EOM are normal. Pupils are equal, round, and reactive to light. Right eye exhibits no discharge. Left eye exhibits no discharge. No scleral icterus.  Neck: Normal range of motion. Neck supple.  Cardiovascular: Normal rate, regular rhythm, normal heart sounds and intact distal pulses.  Exam reveals no gallop and no friction rub.   No murmur heard. Dorsalis pedis 2+ bilaterally  Pulmonary/Chest: Effort normal and breath sounds normal. No respiratory distress. She has no wheezes. She has no rales. She exhibits no tenderness.  Musculoskeletal: Normal range of motion. She exhibits tenderness. She exhibits no edema.  Left foot instep to heel tender to palpation  Lymphadenopathy:    She has no cervical adenopathy.  Neurological: She is alert and oriented to person, place,  and time. No cranial nerve deficit. She exhibits normal muscle tone. Coordination normal.  Skin: Skin is warm and dry. No rash noted. She is not diaphoretic.  Psychiatric: She has a normal mood and affect. Her behavior is normal. Judgment and thought content normal.   Assessment & Plan:   January was seen today for follow-up.  Diagnoses and all orders for this visit:  Viral URI with cough  Arthritis of foot  Other  orders -     indomethacin (INDOCIN) 50 MG capsule; Take 1 capsule (50 mg total) by mouth 2 (two) times daily with a meal. Use as needed for pain  I have discontinued Ms. Veith's ciprofloxacin. I am also having her start on indomethacin. Additionally, I am having her maintain her esomeprazole, oxybutynin, and zolpidem.  Meds ordered this encounter  Medications  . indomethacin (INDOCIN) 50 MG capsule    Sig: Take 1 capsule (50 mg total) by mouth 2 (two) times daily with a meal. Use as needed for pain    Dispense:  30 capsule    Refill:  0    Order Specific Question:  Supervising Provider    Answer:  Crecencio Mc [[id number]]     Follow-up: Return if symptoms worsen or fail to improve.

## 2015-11-25 NOTE — Patient Instructions (Addendum)
Indomethacin 50 mg capsules twice (can use up to 3 x a day) with food when you get the foot pain and redness. Use for shortest period of time.   Start back on mucinex and drink water to help clear mucous.

## 2015-11-25 NOTE — Progress Notes (Signed)
Pre visit review using our clinic review tool, if applicable. No additional management support is needed unless otherwise documented below in the visit note. 

## 2015-11-28 DIAGNOSIS — M19079 Primary osteoarthritis, unspecified ankle and foot: Secondary | ICD-10-CM | POA: Insufficient documentation

## 2015-11-28 NOTE — Assessment & Plan Note (Signed)
New onset Will treat conservatively due to probable viral nature Mucinex plain and benadryl at night encouraged  FU prn worsening/failure to improve.

## 2015-11-28 NOTE — Assessment & Plan Note (Addendum)
New onset Probable pseudo-gout or arthritis  Will try Indocin prn (sent to pharmacy) (foot still hurting) asked her to take with food and without other NSAIDs.  FU prn worsening/failure to improve.

## 2016-01-04 DIAGNOSIS — M7061 Trochanteric bursitis, right hip: Secondary | ICD-10-CM | POA: Insufficient documentation

## 2016-03-15 ENCOUNTER — Ambulatory Visit (INDEPENDENT_AMBULATORY_CARE_PROVIDER_SITE_OTHER): Payer: BLUE CROSS/BLUE SHIELD | Admitting: Family Medicine

## 2016-03-15 ENCOUNTER — Encounter: Payer: Self-pay | Admitting: Family Medicine

## 2016-03-15 VITALS — BP 124/82 | HR 72 | Temp 97.9°F | Ht 62.0 in | Wt 247.0 lb

## 2016-03-15 DIAGNOSIS — R51 Headache: Secondary | ICD-10-CM | POA: Diagnosis not present

## 2016-03-15 DIAGNOSIS — R519 Headache, unspecified: Secondary | ICD-10-CM

## 2016-03-15 MED ORDER — ZOLPIDEM TARTRATE 5 MG PO TABS: ORAL_TABLET | ORAL | Status: DC

## 2016-03-15 MED ORDER — INDOMETHACIN 50 MG PO CAPS: 50.0000 mg | ORAL_CAPSULE | Freq: Two times a day (BID) | ORAL | Status: DC

## 2016-03-15 NOTE — Progress Notes (Signed)
Subjective:  Patient ID: Denise Glass, female    DOB: Feb 15, 1956  Age: 60 y.o. MRN: [id number]  CC: Headache  HPI:  60 year old female presents with complaints of new onset headache.  Patient states that she's had a headache for the past 3 weeks. She states it occurs daily. It is located in the frontal region bilaterally and extends to the scalp. She reports the pain is moderate in severity. She states it is constant. Headache lasts all day. She describes it as achy. She also reports some numbness/sensitivity to her forehead. She states that she has some photophobia yesterday. Improves with indomethacin and Excedrin Migraine. No known exacerbating factors. No new stressors per patient report. She is concerned about her symptoms and is requesting an MRI.   Social Hx   Social History   Social History  . Marital Status: Widowed    Spouse Name: N/A  . Number of Children: N/A  . Years of Education: N/A   Social History Main Topics  . Smoking status: Never Smoker   . Smokeless tobacco: None  . Alcohol Use: No  . Drug Use: No  . Sexual Activity: Not Asked   Other Topics Concern  . None   Social History Narrative   Patient is a 60 y/o widow (2011) who lives independently at home. She has a Retail buyer several properties. She has one son, a daughter-in-law and a grandson that live locally and they share a very good relationship.   Review of Systems  Constitutional: Negative.   Neurological: Positive for headaches.   Objective:  BP 124/82 mmHg  Pulse 72  Temp(Src) 97.9 F (36.6 C) (Oral)  Ht 5\' 2"  (1.575 m)  Wt 247 lb (112.038 kg)  BMI 45.17 kg/m2  SpO2 97%  BP/Weight 03/15/2016 11/25/2015 0000000  Systolic BP [id number]  Diastolic BP 82 68 80  Wt. (Lbs) 247 244 239.12  BMI 45.17 44.62 43.72   Physical Exam  Constitutional: She is oriented to person, place, and time. She appears well-developed. No distress.  Cardiovascular: Normal rate and regular  rhythm.   Pulmonary/Chest: Effort normal and breath sounds normal.  Neurological: She is alert and oriented to person, place, and time.  CN's intact. Normal biceps/triceps/brachioradialis reflexes (2+). Normal muscle strength. No focal deficits.  Psychiatric: She has a normal mood and affect.  Vitals reviewed.  Lab Results  Component Value Date   WBC 7.5 01/17/2015   HGB 14.2 01/17/2015   HCT 41.9 01/17/2015   PLT 265 01/17/2015   GLUCOSE 107* 01/17/2015   CHOL 209* 04/06/2014   TRIG 120.0 04/06/2014   HDL 61.10 04/06/2014   LDLCALC 124* 04/06/2014   ALT 13* 01/17/2015   AST 20 01/17/2015   NA 140 01/17/2015   K 4.5 01/17/2015   CL 104 01/17/2015   CREATININE 0.86 01/17/2015   BUN 13 01/17/2015   CO2 29 01/17/2015   TSH 2.99 04/06/2014   Assessment & Plan:   Problem List Items Addressed This Visit    New onset of headaches after age 34 - Primary    This is a new problem for the patient. Unclear etiology/prognosis at this time. DDX - tension headache, trigeminal neuralgia, atypical migraine. Given new onset after 50, sending for MRI. Referring to neuro for further eval/managment. Advised to continue PRN Indomethacin. Refilled today.       Relevant Medications   indomethacin (INDOCIN) 50 MG capsule   Other Relevant Orders   Ambulatory referral to Neurology  MR Brain Wo Contrast      Meds ordered this encounter  Medications  . zolpidem (AMBIEN) 5 MG tablet    Sig: TAKE 1 TABLET (5 MG TOTAL) BY MOUTH AT BEDTIME AS NEEDED FOR SLEEP    Dispense:  30 tablet    Refill:  1  . indomethacin (INDOCIN) 50 MG capsule    Sig: Take 1 capsule (50 mg total) by mouth 2 (two) times daily with a meal. Use as needed for pain    Dispense:  30 capsule    Refill:  0    Follow-up: PRN  [place]

## 2016-03-15 NOTE — Patient Instructions (Signed)
Continue the indomethacin as needed.  We will call with your Neuro appt as well as with the MRI.  Take care  Dr. Lacinda Axon

## 2016-03-15 NOTE — Assessment & Plan Note (Signed)
This is a new problem for the patient. Unclear etiology/prognosis at this time. DDX - tension headache, trigeminal neuralgia, atypical migraine. Given new onset after 50, sending for MRI. Referring to neuro for further eval/managment. Advised to continue PRN Indomethacin. Refilled today.

## 2016-03-15 NOTE — Progress Notes (Signed)
Pre visit review using our clinic review tool, if applicable. No additional management support is needed unless otherwise documented below in the visit note. 

## 2016-03-24 ENCOUNTER — Ambulatory Visit (INDEPENDENT_AMBULATORY_CARE_PROVIDER_SITE_OTHER): Payer: BLUE CROSS/BLUE SHIELD | Admitting: Neurology

## 2016-03-24 ENCOUNTER — Encounter: Payer: Self-pay | Admitting: Neurology

## 2016-03-24 VITALS — BP 120/75 | HR 65 | Ht 62.0 in | Wt 245.8 lb

## 2016-03-24 DIAGNOSIS — G4489 Other headache syndrome: Secondary | ICD-10-CM | POA: Insufficient documentation

## 2016-03-24 DIAGNOSIS — G4452 New daily persistent headache (NDPH): Secondary | ICD-10-CM | POA: Diagnosis not present

## 2016-03-24 MED ORDER — TOPIRAMATE 50 MG PO TABS: 50.0000 mg | ORAL_TABLET | Freq: Two times a day (BID) | ORAL | Status: DC

## 2016-03-24 MED ORDER — TRAMADOL HCL 50 MG PO TABS: 100.0000 mg | ORAL_TABLET | Freq: Four times a day (QID) | ORAL | Status: DC | PRN

## 2016-03-24 NOTE — Patient Instructions (Signed)
I had a long discussion with the patient regarding her new onset daily persistent headache which seems like muscle tension headache. I agree with checking an MRI scan of the brain to look for structural or vascular lesions. She also has a component of analgesic rebound headache. I recommend she discontinue atenolol and indomethacin. I have given her a prescription of Topamax 50 mg daily for 1 week to increase to twice daily if tolerated. I have discussed possible side effects with her and advised her to call me if needed. Take tramadol 1 or 2 tablets as needed for symptomatic relief maximum 3 times a day not more than 2 days per week. She was also encouraged to do regular neck stretching exercises and increase participate in any activities for stress relaxation-like regular exercise, swimming, medication and yoga. She was advised to return for follow-up in 2 months Analgesic Rebound Headaches An analgesic rebound headache is a headache that returns after pain medicine (analgesic) that was taken to treat the initial headache wears off. People who suffer from tension, migraine, or cluster headaches are at risk for developing rebound headaches. Any type of primary headache can return as a rebound headache if you regularly take analgesics more than three times a week. If the cycle of rebound headaches continues, they become chronic daily headaches.  CAUSES Analgesics frequently associated with this problem include common over-the-counter medicines like aspirin, ibuprofen, acetaminophen, sinus relief medicines, and other medicines that contain caffeine. Narcotic pain medicines are also a common cause of rebound headaches.  SIGNS AND SYMPTOMS The symptoms of rebound headaches are the same as the symptoms of your initial headache. Symptoms of specific types of headaches include: Tension headache  Pressure around the head.  Dull, aching head pain.  Pain felt over the front and sides of the head.  Tenderness  in the muscles of the head, neck and shoulders. Migraine Headache  Pulsing or throbbing pain on one or both sides of the head.  Severe pain that interferes with daily activities.  Pain that is worsened by physical activity.  Nausea, vomiting, or both.  Pain with exposure to bright light, loud noises, or strong smells.  General sensitivity to bright light, loud noises, or strong smells.  Visual changes.  Numbness of one or both arms. Cluster Headaches  Severe pain that begins in or around one eye or temple.  Redness in the eye on the same side as the pain.  Droopy or swollen eyelid.  One-sided head pain.  Nausea.  Runny nose.  Sweaty, pale facial skin.  Restlessness. DIAGNOSIS  Analgesic rebound headaches are diagnosed by reviewing your medical history. This includes the nature of your initial headaches, as well as the type of pain medicines you have been using to treat your headaches and how often you take them. TREATMENT Discontinuing frequent use of the analgesic medicine will typically reduce the frequency of the rebound episodes. This may initially worsen your headaches but eventually the pain should become more manageable, less frequent, and less severe.  Seeing a headache specialists may helpful. He or she may be able to help you manage your headaches and to make sure there is not another cause of the headaches. Alternative methods of stress relief such as acupuncture, counseling, biofeedback, and massage may also be helpful. Talk with your health care provider about which alternative treatments might be good for you. HOME CARE INSTRUCTIONS Stopping the regular use of pain medicine can be difficult. Follow your health care provider's instructions carefully. Keep all  of your appointments. Avoid triggers that are known to cause your primary headaches. SEEK MEDICAL CARE IF: You continue to experience headaches after following your health care provider's recommended  treatments. SEEK IMMEDIATE MEDICAL CARE IF:  You develop new headache pain.  You develop headache pain that is different than what you have experienced in the past.  You develop numbness or tingling in your arms or legs.  You develop changes in your speech or vision. MAKE SURE YOU:  Understand these instructions.  Will watch your child's condition.  Will get help right away if your child is not doing well or gets worse.   This information is not intended to replace advice given to you by your health care provider. Make sure you discuss any questions you have with your health care provider.   Document Released: 12/23/2003 Document Revised: 10/23/2014 Document Reviewed: 04/17/2013 Elsevier Interactive Patient Education [employer].

## 2016-03-24 NOTE — Progress Notes (Signed)
[facility] [address] [place]. [place] [address] [phone]       OFFICE CONSULT NOTE  Denise Glass Date of Birth:  03-01-1956 Medical Record Number:  [id number]   Referring MD: Thersa Salt Reason for Referral:  Headache  HPI: 57 year Caucasian lady who has a one-month history of new onset persistent daily headaches. She describes the headache starting in the frontal region in the midline. She describes this as a constant pressure-like sensation. Moderate 7/10 intensity. It is mainly in the midline but may spread to either side. There is no accompanying nausea, vomiting, light or sound sensitivity. The headache makes her tired and she has to rest and not finish her work. She has been taking 2 tablets of extra strength Tylenol 3-4 times a day as well as indomethacin 2 tablets twice daily for the last 1 week with only partial relief. She denies any vision loss any focal extremity weakness numbness. She has no known prior history of migraine headaches. She denies any significant fall or head injury or loss of consciousness. She has an outpatient MRI scan of the brain scheduled on June 14. There are no visible brain imaging studies in the electronic medical records. The patient admits to having gradually gained about 40 pounds weight in the last 2 years. She has not tried any daily medication for headache prophylaxis. She does admit to being under increased stress recently. She was involved in a lawsuit with her tenants which has recently been settled.  ROS:   14 system review of systems is positive for headache, eye pain, numbness and all other systems negative  PMH:  Past Medical History  Diagnosis Date  . Mitral valve prolapse 2003    Followed by Dr. Clayborn Bigness (prn)  . Urine frequency   . GERD (gastroesophageal reflux disease)     Social History:  Social History   Social History  . Marital Status: Widowed    Spouse Name: N/A  . Number of Children: N/A  .  Years of Education: N/A   Occupational History  . Not on file.   Social History Main Topics  . Smoking status: Never Smoker   . Smokeless tobacco: Not on file  . Alcohol Use: No  . Drug Use: No  . Sexual Activity: Not on file   Other Topics Concern  . Not on file   Social History Narrative   Patient is a 60 y/o widow (2011) who lives independently at home. She has a Retail buyer several properties. She has one son, a daughter-in-law and a grandson that live locally and they share a very good relationship.    Medications:   Current Outpatient Prescriptions on File Prior to Visit  Medication Sig Dispense Refill  . oxybutynin (DITROPAN-XL) 10 MG 24 hr tablet Take 1 tablet (10 mg total) by mouth at bedtime. 30 tablet 12  . zolpidem (AMBIEN) 5 MG tablet TAKE 1 TABLET (5 MG TOTAL) BY MOUTH AT BEDTIME AS NEEDED FOR SLEEP 30 tablet 1   No current facility-administered medications on file prior to visit.    Allergies:   Allergies  Allergen Reactions  . Sulfa Antibiotics     Physical Exam General: Mildly obese middle-age Caucasian lady, seated, in no evident distress Head: head normocephalic and atraumatic.   Neck: supple with no carotid or supraclavicular bruits Cardiovascular: regular rate and rhythm, no murmurs Musculoskeletal: no deformity. Mild spasm posterior neck and upper scapular muscles. Skin:  no rash/petichiae Vascular:  Normal pulses all extremities  Neurologic Exam Mental Status: Awake and fully alert. Oriented to place and time. Recent and remote memory intact. Attention span, concentration and fund of knowledge appropriate. Mood and affect appropriate.  Cranial Nerves: Fundoscopic exam reveals sharp disc margins. Pupils equal, briskly reactive to light. Extraocular movements full without nystagmus. Visual fields full to confrontation. Hearing intact. Facial sensation intact. Face, tongue, palate moves normally and symmetrically.  Motor: Normal  bulk and tone. Normal strength in all tested extremity muscles. Sensory.: intact to touch , pinprick , position and vibratory sensation.  Coordination: Rapid alternating movements normal in all extremities. Finger-to-nose and heel-to-shin performed accurately bilaterally. Gait and Station: Arises from chair without difficulty. Stance is normal. Gait demonstrates normal stride length and balance . Able to heel, toe and tandem walk without difficulty.  Reflexes: 1+ and symmetric. Toes downgoing.       ASSESSMENT: 28 year Caucasian lady who has had one month history of new onset persistent daily headache likely tension headache.    PLAN: I had a long discussion with the patient regarding her new onset daily persistent headache which seems like muscle tension headache. I agree with checking an MRI scan of the brain to look for structural or vascular lesions. She also has a component of analgesic rebound headache. I recommend she discontinue atenolol and indomethacin. I have given her a prescription of Topamax 50 mg daily for 1 week to increase to twice daily if tolerated. I have discussed possible side effects with her and advised her to call me if needed. Take tramadol 1 or 2 tablets as needed for symptomatic relief maximum 3 times a day not more than 2 days per week. She was also encouraged to do regular neck stretching exercises and increase participate in any activities for stress relaxation-like regular exercise, swimming, medication and yoga. Greater than 50% time during this 45 minute consultation visit was spent on counseling and coordination of care about headache prevention and treatment She was advised to return for follow-up in 2 months Antony Contras, MD  [facility] [address] [place], [place] [address]  Phone [phone] Fax [phone] Note: This document was prepared with digital dictation and possible smart phrase technology. Any  transcriptional errors that result from this process are unintentional.

## 2016-03-29 ENCOUNTER — Telehealth: Payer: Self-pay | Admitting: Neurology

## 2016-03-29 NOTE — Telephone Encounter (Signed)
Rn call patient back about bringing a disc from [facility] for a disc of her MRI. Rn explain Dr.Sethi is covering the hospital this week,and he will be on vacation next week. Denise Glass stated she will bring the disc by when her headache gets better. Rn stated if she feels nausea try eating some crackers and water when she takes the ultram and topamax for her headaches. Pt stated she has not thrown up. Pt stated the combination of both meds are helping a little. Rn stated a message will be sent to Dr. Leonie Man.

## 2016-03-29 NOTE — Telephone Encounter (Signed)
Patient called to advise she saw Dr. Leonie Man Friday and Dr. Leonie Man wanted disk from MRI today, patient has disk from today's MRI that was done at [facility] and "will try to bring tomorrow, was going to bring today, very sick from cluster headache. States she is taking medications that Dr. Leonie Man prescribed, doesn't know what Dr. Clydene Fake plan is, if there is anything else he can do".

## 2016-03-29 NOTE — Telephone Encounter (Signed)
Agree with plan 

## 2016-03-29 NOTE — Telephone Encounter (Signed)
Rn call patient to state she should eat some crackers when taking her meds to subside the nausea. Pt verbalized understanding. Pt stated the report from [facility] was schedule to be fax today. Rn stated it was not receive as of yet. PT verbalized understanding.

## 2016-03-30 ENCOUNTER — Telehealth: Payer: Self-pay | Admitting: *Deleted

## 2016-03-30 ENCOUNTER — Other Ambulatory Visit: Payer: Self-pay | Admitting: Family Medicine

## 2016-03-30 MED ORDER — ONDANSETRON HCL 4 MG PO TABS: 4.0000 mg | ORAL_TABLET | Freq: Three times a day (TID) | ORAL | Status: DC | PRN

## 2016-03-30 NOTE — Telephone Encounter (Signed)
I will send in something for nausea. I have not received results of MRI.

## 2016-03-30 NOTE — Telephone Encounter (Signed)
MRI sent from [facility]. Report put in Dr.Sethi in basket.

## 2016-03-30 NOTE — Telephone Encounter (Signed)
Please advise. Thanks.  

## 2016-03-30 NOTE — Telephone Encounter (Signed)
Patient currently has nausea with cluster headaches,she has a medication for her headaches. She requested to have something called in for her nausea. She was seen in the office on 05/31.  Patient also questioned if Dr.Cook received results from [facility], for her MRI. Patient requested a copy, if results are in.  Pt contact [phone]

## 2016-03-30 NOTE — Telephone Encounter (Signed)
Spoke with the patient, she will pick up the zofran. thanks

## 2016-04-03 NOTE — Telephone Encounter (Signed)
Patient was informed of results.  Patient wants to discuss the results with Dr. Lacinda Axon, to ask if that is what was causing the symptoms and what will the next step be.  Patient wants Dr. Lacinda Axon to call her at home. Please advise.

## 2016-04-03 NOTE — Telephone Encounter (Signed)
Please advise if results have been received.

## 2016-04-03 NOTE — Telephone Encounter (Signed)
Chronic microvascular changes. No acute abnormalities.

## 2016-04-03 NOTE — Telephone Encounter (Signed)
Left a VM on cell Phone, tried House phone was a busy signal only. thanks

## 2016-04-03 NOTE — Telephone Encounter (Signed)
I do not know the etiology of her symptoms. It appears that neurology thought this was tension headache.

## 2016-04-03 NOTE — Telephone Encounter (Signed)
Pt call for her MRI results. Please call her after 1 pm at home number.

## 2016-04-03 NOTE — Telephone Encounter (Signed)
Pt called back wanting to know if Dr Lacinda Axon received the MRI results form 06/14. And pt wants to know does she need to come back in for follow up?  Call pt @ [phone]

## 2016-04-03 NOTE — Telephone Encounter (Signed)
Patient discussed what she will be doing next due to her sx.  Patient has an neurology appointment in august. Patient had no comments, concerns or questions at this time.

## 2016-05-12 ENCOUNTER — Ambulatory Visit: Payer: BLUE CROSS/BLUE SHIELD | Admitting: Neurology

## 2016-06-01 ENCOUNTER — Ambulatory Visit (INDEPENDENT_AMBULATORY_CARE_PROVIDER_SITE_OTHER): Payer: BLUE CROSS/BLUE SHIELD | Admitting: Neurology

## 2016-06-01 ENCOUNTER — Encounter: Payer: Self-pay | Admitting: Neurology

## 2016-06-01 VITALS — BP 115/60 | HR 65 | Ht 62.0 in | Wt 242.2 lb

## 2016-06-01 DIAGNOSIS — R200 Anesthesia of skin: Secondary | ICD-10-CM | POA: Diagnosis not present

## 2016-06-01 NOTE — Patient Instructions (Signed)
I had a long discussion with the patient with regards to her headache which seems to have resolved. Agree with discontinuing Topamax. I encouraged her to continue participation in activities for stress relaxation-like regular meditation, yoga, exercise, swimming and going to the gym. She may return for follow-up in the future only if necessary and no routine  Follow up appointment was made

## 2016-06-01 NOTE — Progress Notes (Signed)
[facility] [address] [place]. [place] [address] [phone]       OFFICE FOLLOW UP VISIT NOTE  [name] Date of Birth:  June 20, 1956 Medical Record Number:  [id number]   Referring MD: Thersa Salt Reason for Referral:  Headache  HPI:  Initial Consult 03/24/2016 ;  60 year Caucasian lady who has a one-month history of new onset persistent daily headaches. She describes the headache starting in the frontal region in the midline. She describes this as a constant pressure-like sensation. Moderate 7/10 intensity. It is mainly in the midline but may spread to either side. There is no accompanying nausea, vomiting, light or sound sensitivity. The headache makes her tired and she has to rest and not finish her work. She has been taking 2 tablets of extra strength Tylenol 3-4 times a day as well as indomethacin 2 tablets twice daily for the last 1 week with only partial relief. She denies any vision loss any focal extremity weakness numbness. She has no known prior history of migraine headaches. She denies any significant fall or head injury or loss of consciousness. She has an outpatient MRI scan of the brain scheduled on June 14. There are no visible brain imaging studies in the electronic medical records. The patient admits to having gradually gained about 40 pounds weight in the last 2 years. She has not tried any daily medication for headache prophylaxis. She does admit to being under increased stress recently. She was involved in a lawsuit with her tenants which has recently been settled. Update 06/01/2016 :  She returns for follow-up after last visit 2 months ago. She states that Topamax did help her headaches significantly. She did have a constipation as a side effect. But since the headaches went away she discontinued Topamax about 6 weeks ago. Her constipation took 2 weeks to clear. She has had no recurrent headaches. She also admits that she was under stress with a  lawsuit against her was dismissed and she is feeling better. She had an MRI scan of the brain done on 03/29/16 and agree with the report from [facility] which showed only nonspecific white matter hyperintensities in the subcortical white matter and no other abnormality. Patient is doing well and has no complaints today. ROS:   14 system review of systems is positive for constipation, numbness only and all other systems negative  PMH:  Past Medical History:  Diagnosis Date  . GERD (gastroesophageal reflux disease)   . Headache   . Mitral valve prolapse 2003   Followed by Dr. Clayborn Bigness (prn)  . Urine frequency     Social History:  Social History   Social History  . Marital status: Widowed    Spouse name: N/A  . Number of children: N/A  . Years of education: N/A   Occupational History  . Not on file.   Social History Main Topics  . Smoking status: Never Smoker  . Smokeless tobacco: Never Used  . Alcohol use No  . Drug use: No  . Sexual activity: Not on file   Other Topics Concern  . Not on file   Social History Narrative   Patient is a 60 y/o widow (2011) (2011) who lives independently at home. She has a Retail buyer several properties. She has one son, a daughter-in-law and a grandson that live locally and they share a very good relationship.    Medications:   Current Outpatient Prescriptions on File Prior to Visit  Medication Sig Dispense Refill  . omeprazole (PRILOSEC) 20 MG capsule Take 20 mg by mouth daily.    . ondansetron (ZOFRAN) 4 MG tablet Take 1 tablet (4 mg total) by mouth every 8 (eight) hours as needed for nausea or vomiting. 20 tablet 0  . oxybutynin (DITROPAN-XL) 10 MG 24 hr tablet Take 1 tablet (10 mg total) by mouth at bedtime. 30 tablet 12  . topiramate (TOPAMAX) 50 MG tablet Take 1 tablet (50 mg total) by mouth 2 (two) times daily. Start one tablet daily x 1 week then twice daily 60 tablet 1  . traMADol (ULTRAM) 50 MG tablet Take 2  tablets (100 mg total) by mouth every 6 (six) hours as needed. 30 tablet 1  . zolpidem (AMBIEN) 5 MG tablet TAKE 1 TABLET (5 MG TOTAL) BY MOUTH AT BEDTIME AS NEEDED FOR SLEEP 30 tablet 1   No current facility-administered medications on file prior to visit.     Allergies:   Allergies  Allergen Reactions  . Sulfa Antibiotics     Physical Exam General: Mildly obese 60 year Caucasian lady, seated, in no evident distress Head: head normocephalic and atraumatic.   Neck: supple with no carotid or supraclavicular bruits Cardiovascular: regular rate and rhythm, no murmurs Musculoskeletal: no deformity. Mild spasm posterior neck and upper scapular muscles. Skin:  no rash/petichiae Vascular:  Normal pulses all extremities  Neurologic Exam Mental Status: Awake and fully alert. Oriented to place and time. Recent and remote memory intact. Attention span, concentration and fund of knowledge appropriate. Mood and affect appropriate.  Cranial Nerves: Fundoscopic exam reveals sharp disc margins. Pupils equal, briskly reactive to light. Extraocular movements full without nystagmus. Visual fields full to confrontation. Hearing intact. Facial sensation intact. Face, tongue, palate moves normally and symmetrically.  Motor: Normal bulk and tone. Normal strength in all tested extremity muscles. Sensory.: intact to touch , pinprick , position and vibratory sensation.  Coordination: Rapid alternating movements normal in all extremities. Finger-to-nose and heel-to-shin performed accurately bilaterally. Gait and Station: Arises from chair without difficulty. Stance is normal. Gait demonstrates normal stride length and balance . Able to heel, toe and tandem walk without difficulty.  Reflexes: 1+ and symmetric. Toes downgoing.       ASSESSMENT: 60 year Caucasian lady who has had one month history of new onset persistent daily headache likely tension headache.    PLAN: I had a long discussion with the  patient regarding her new onset daily persistent headache which seems like muscle tension headache. I agree with checking an MRI scan of the brain to look for structural or vascular lesions. She also has a component of analgesic rebound headache. I recommend she discontinue atenolol and indomethacin. I have given her a prescription of Topamax 50 mg daily for 1 week to increase to twice daily if tolerated. I have discussed possible side effects with her and advised her to call me if needed. Take tramadol 1 or 2 tablets as needed for symptomatic relief maximum 3 times a day not more than 2 days per week. She was also encouraged to do regular neck stretching exercises and increase participate in any activities for stress relaxation-like regular exercise, swimming, medication and yoga. Greater than 50% time during this 45 minute consultation visit was spent on counseling and coordination of care about headache prevention and treatment She was advised to return for follow-up in 2 months Antony Contras, MD  [facility] [address] [place], [place] [address]  Phone [phone] Fax  [phone] Note: This document was prepared with digital dictation and possible smart phrase technology. Any transcriptional errors that result from this process are unintentional.

## 2016-08-31 ENCOUNTER — Other Ambulatory Visit: Payer: Self-pay | Admitting: Nurse Practitioner

## 2016-08-31 NOTE — Telephone Encounter (Signed)
Last office 03/15/16 acute No office visit schedule

## 2016-09-26 ENCOUNTER — Other Ambulatory Visit: Payer: Self-pay | Admitting: Family Medicine

## 2016-09-26 MED ORDER — OXYBUTYNIN CHLORIDE ER 10 MG PO TB24: 10.0000 mg | ORAL_TABLET | Freq: Every day | ORAL | 1 refills | Status: AC

## 2016-09-26 NOTE — Telephone Encounter (Signed)
Refilled 08/31/16. Pt last seen 03/15/16. Please advise?

## 2016-09-26 NOTE — Telephone Encounter (Signed)
Pt states that she needs a refill on oxybutynin (DITROPAN-XL) 10 MG 24 hr tablet sent to [facility] in [place].. Please advise

## 2016-10-26 ENCOUNTER — Other Ambulatory Visit: Payer: Self-pay | Admitting: Family Medicine

## 2016-10-26 NOTE — Telephone Encounter (Signed)
Refilled 03/15/16. Pt last seen 03/15/16. Please advise?

## 2016-10-26 NOTE — Telephone Encounter (Signed)
faxed

## 2016-11-10 ENCOUNTER — Encounter: Payer: Self-pay | Admitting: *Deleted

## 2016-11-13 ENCOUNTER — Ambulatory Visit: Payer: BLUE CROSS/BLUE SHIELD | Admitting: Anesthesiology

## 2016-11-13 ENCOUNTER — Encounter
Admission: RE | Disposition: A | Discharge status: Home / Self Care | Payer: Self-pay | Source: Ambulatory Visit | Attending: Unknown Physician Specialty

## 2016-11-13 ENCOUNTER — Ambulatory Visit
Admission: RE | Admit: 2016-11-13 | Discharge: 2016-11-13 | Disposition: A | Discharge status: Home / Self Care | Payer: BLUE CROSS/BLUE SHIELD | Source: Ambulatory Visit | Attending: Unknown Physician Specialty | Admitting: Unknown Physician Specialty

## 2016-11-13 ENCOUNTER — Encounter: Payer: Self-pay | Admitting: Anesthesiology

## 2016-11-13 DIAGNOSIS — D122 Benign neoplasm of ascending colon: Secondary | ICD-10-CM | POA: Diagnosis not present

## 2016-11-13 DIAGNOSIS — K640 First degree hemorrhoids: Secondary | ICD-10-CM | POA: Diagnosis not present

## 2016-11-13 DIAGNOSIS — K219 Gastro-esophageal reflux disease without esophagitis: Secondary | ICD-10-CM | POA: Insufficient documentation

## 2016-11-13 DIAGNOSIS — Z79899 Other long term (current) drug therapy: Secondary | ICD-10-CM | POA: Insufficient documentation

## 2016-11-13 DIAGNOSIS — K921 Melena: Secondary | ICD-10-CM | POA: Diagnosis not present

## 2016-11-13 DIAGNOSIS — M1990 Unspecified osteoarthritis, unspecified site: Secondary | ICD-10-CM | POA: Insufficient documentation

## 2016-11-13 DIAGNOSIS — I341 Nonrheumatic mitral (valve) prolapse: Secondary | ICD-10-CM | POA: Insufficient documentation

## 2016-11-13 HISTORY — PX: COLONOSCOPY WITH PROPOFOL: SHX5780

## 2016-11-13 HISTORY — DX: Unspecified osteoarthritis, unspecified site: M19.90

## 2016-11-13 SURGERY — COLONOSCOPY WITH PROPOFOL
Anesthesia: General

## 2016-11-13 MED ORDER — PROPOFOL 500 MG/50ML IV EMUL
INTRAVENOUS | Status: AC
  Filled 2016-11-13: qty 50

## 2016-11-13 MED ORDER — PIPERACILLIN-TAZOBACTAM 3.375 G IVPB: 3.3750 g | Freq: Once | INTRAVENOUS | Status: DC

## 2016-11-13 MED ORDER — PROPOFOL 10 MG/ML IV BOLUS
INTRAVENOUS | Status: DC | PRN
  Administered 2016-11-13: 100 mg via INTRAVENOUS
  Administered 2016-11-13 (×2): 30 mg via INTRAVENOUS
  Administered 2016-11-13: 60 mg via INTRAVENOUS
  Administered 2016-11-13: 30 mg via INTRAVENOUS

## 2016-11-13 MED ORDER — SODIUM CHLORIDE 0.9 % IV SOLN
INTRAVENOUS | Status: DC
  Administered 2016-11-13: 15:00:00 via INTRAVENOUS
  Administered 2016-11-13: 1000 mL via INTRAVENOUS

## 2016-11-13 MED ORDER — SODIUM CHLORIDE 0.9 % IV SOLN: INTRAVENOUS | Status: DC

## 2016-11-13 MED ORDER — PROPOFOL 500 MG/50ML IV EMUL
INTRAVENOUS | Status: DC | PRN
  Administered 2016-11-13: 150 ug/kg/min via INTRAVENOUS

## 2016-11-13 NOTE — Anesthesia Postprocedure Evaluation (Signed)
Anesthesia Post Note  Patient: Denise Glass  Procedure(s) Performed: Procedure(s) (LRB): COLONOSCOPY WITH PROPOFOL (N/A)  Patient location during evaluation: Endoscopy Anesthesia Type: General Level of consciousness: awake and alert and oriented Pain management: pain level controlled Vital Signs Assessment: post-procedure vital signs reviewed and stable Respiratory status: spontaneous breathing, nonlabored ventilation and respiratory function stable Cardiovascular status: blood pressure returned to baseline and stable Postop Assessment: no signs of nausea or vomiting Anesthetic complications: no     Last Vitals:  Vitals:   11/13/16 1554 11/13/16 1624  BP: (!) 95/55 118/66  Pulse: 72   Resp: 18   Temp: 36.3 C     Last Pain:  Vitals:   11/13/16 1554  TempSrc: Tympanic                 Kham Zuckerman

## 2016-11-13 NOTE — Anesthesia Preprocedure Evaluation (Signed)
Anesthesia Evaluation  Patient identified by MRN, date of birth, ID band Patient awake    Reviewed: Allergy & Precautions, NPO status , Patient's Chart, lab work & pertinent test results, reviewed documented beta blocker date and time   Airway Mallampati: II  TM Distance: >3 FB     Dental  (+) Chipped   Pulmonary           Cardiovascular      Neuro/Psych    GI/Hepatic   Endo/Other    Renal/GU      Musculoskeletal   Abdominal   Peds  Hematology   Anesthesia Other Findings MVP.  Reproductive/Obstetrics                             Anesthesia Physical Anesthesia Plan  ASA: III  Anesthesia Plan: General   Post-op Pain Management:    Induction: Intravenous  Airway Management Planned: Nasal Cannula  Additional Equipment:   Intra-op Plan:   Post-operative Plan:   Informed Consent: I have reviewed the patients History and Physical, chart, labs and discussed the procedure including the risks, benefits and alternatives for the proposed anesthesia with the patient or authorized representative who has indicated his/her understanding and acceptance.     Plan Discussed with: CRNA  Anesthesia Plan Comments:         Anesthesia Quick Evaluation

## 2016-11-13 NOTE — Anesthesia Post-op Follow-up Note (Cosign Needed)
Anesthesia QCDR form completed.        

## 2016-11-13 NOTE — Transfer of Care (Signed)
Immediate Anesthesia Transfer of Care Note  Patient: Denise Glass  Procedure(s) Performed: Procedure(s): COLONOSCOPY WITH PROPOFOL (N/A)  Patient Location: PACU  Anesthesia Type:General  Level of Consciousness: awake, alert  and oriented  Airway & Oxygen Therapy: Patient Spontanous Breathing and Patient connected to nasal cannula oxygen  Post-op Assessment: Report given to RN and Post -op Vital signs reviewed and stable  Post vital signs: Reviewed and stable  Last Vitals:  Vitals:   11/13/16 1415 11/13/16 1554  BP: (!) 159/90 (!) 95/55  Pulse: 80 72  Resp: 20 18  Temp: 36.9 C 36.3 C    Last Pain:  Vitals:   11/13/16 1554  TempSrc: Tympanic         Complications: No apparent anesthesia complications

## 2016-11-13 NOTE — H&P (Signed)
Primary Care Physician:  Coral Spikes, DO Primary Gastroenterologist:  Dr. Vira Agar  Pre-Procedure History & Physical: HPI:  Denise Glass is a 61 y.o. female is here for an colonoscopy.   Past Medical History:  Diagnosis Date  . DJD (degenerative joint disease)   . GERD (gastroesophageal reflux disease)   . Headache   . Mitral valve prolapse 2003   Followed by Dr. Clayborn Bigness (prn)  . Urine frequency     Past Surgical History:  Procedure Laterality Date  . BREAST BIOPSY  2001   Calcification - bx done by Dr. Raylene Everts  . BREAST SURGERY  2001   biopsy  . JOINT REPLACEMENT  2008   left and right knee replaced  . KNEE ARTHROPLASTY  2007   Right Knee  . KNEE ARTHROPLASTY  2008   Left Knee  . left total knee replacement    . right total knee    . TUBAL LIGATION      Prior to Admission medications   Medication Sig Start Date End Date Taking? Authorizing Provider  indomethacin (INDOCIN) 50 MG capsule Take 50 mg by mouth 2 (two) times daily with a meal.   Yes Historical Provider, MD  omeprazole (PRILOSEC) 20 MG capsule Take 20 mg by mouth daily.   Yes Historical Provider, MD  ondansetron (ZOFRAN) 4 MG tablet Take 1 tablet (4 mg total) by mouth every 8 (eight) hours as needed for nausea or vomiting. 03/30/16  Yes Coral Spikes, DO  topiramate (TOPAMAX) 50 MG tablet Take 1 tablet (50 mg total) by mouth 2 (two) times daily. Start one tablet daily x 1 week then twice daily 03/24/16  Yes Garvin Fila, MD  traMADol (ULTRAM) 50 MG tablet Take 2 tablets (100 mg total) by mouth every 6 (six) hours as needed. 03/24/16  Yes Garvin Fila, MD  zolpidem (AMBIEN) 5 MG tablet TAKE 1 TABLET AT BEDTIME AS NEEDED FOR SLEEP 10/26/16  Yes Coral Spikes, DO    Allergies as of 11/10/2016 - Review Complete 11/10/2016  Allergen Reaction Noted  . Sulfa antibiotics  09/23/2012    Family History  Problem Relation Age of Onset  . Heart disease Father   . COPD Father   . Cancer Father   . COPD Mother    . Cancer Cousin     Social History   Social History  . Marital status: Widowed    Spouse name: N/A  . Number of children: N/A  . Years of education: N/A   Occupational History  . Not on file.   Social History Main Topics  . Smoking status: Never Smoker  . Smokeless tobacco: Never Used  . Alcohol use No  . Drug use: No  . Sexual activity: Not on file   Other Topics Concern  . Not on file   Social History Narrative   Patient is a 61 y/o widow (2011) who lives independently at home. She has a Retail buyer several properties. She has one son, a daughter-in-law and a grandson that live locally and they share a very good relationship.    Review of Systems: See HPI, otherwise negative ROS  Physical Exam: BP (!) 159/90   Pulse 80   Temp 98.5 F (36.9 C) (Tympanic)   Resp 20   Ht 5\' 2"  (1.575 m)   Wt 113.4 kg (250 lb)   SpO2 100%   BMI 45.73 kg/m  General:   Alert,  pleasant and cooperative  in NAD Head:  Normocephalic and atraumatic. Neck:  Supple; no masses or thyromegaly. Lungs:  Clear throughout to auscultation.    Heart:  Regular rate and rhythm. Abdomen:  Soft, nontender and nondistended. Normal bowel sounds, without guarding, and without rebound.   Neurologic:  Alert and  oriented x4;  grossly normal neurologically.  Impression/Plan: Denise Glass is here for an colonoscopy to be performed for rectal bleeding and heme positive stool  Risks, benefits, limitations, and alternatives regarding  colonoscopy have been reviewed with the patient.  Questions have been answered.  All parties agreeable.   Gaylyn Cheers, MD  11/13/2016, 3:16 PM

## 2016-11-13 NOTE — Op Note (Signed)
[facility] Patient Name: Denise Glass Procedure Date: 11/13/2016 3:15 PM MRN: [id number] Account #: [id number] Date of Birth: 23-May-1956 Admit Type: Outpatient Age: 61 Room: [facility] ENDO ROOM 1 Gender: Female Note Status: Finalized Procedure:            Colonoscopy Indications:          Heme positive stool Providers:            Manya Silvas, MD Referring MD:         No Local Md, MD (Referring MD) Medicines:            Propofol per Anesthesia Complications:        No immediate complications. Procedure:            Pre-Anesthesia Assessment:                       - After reviewing the risks and benefits, the patient                        was deemed in satisfactory condition to undergo the                        procedure.                       After obtaining informed consent, the colonoscope was                        passed under direct vision. Throughout the procedure,                        the patient's blood pressure, pulse, and oxygen                        saturations were monitored continuously. The                        Colonoscope was introduced through the anus and                        advanced to the the cecum, identified by appendiceal                        orifice and ileocecal valve. The colonoscopy was                        performed without difficulty. The patient tolerated the                        procedure well. The quality of the bowel preparation                        was good. Findings:      A small polyp was found in the ascending colon. The polyp was sessile.       The polyp was removed with a jumbo cold forceps. Resection and retrieval       were complete.      Internal hemorrhoids were found during endoscopy. The hemorrhoids were       small and Grade I (internal hemorrhoids that do not prolapse).      The exam was otherwise  without abnormality. Impression:           - One small polyp in the ascending colon,  removed with                        a jumbo cold forceps. Resected and retrieved.                       - Internal hemorrhoids.                       - The examination was otherwise normal. Recommendation:       - Repeat colonoscopy in 5 years for surveillance. Manya Silvas, MD 11/13/2016 3:51:23 PM This report has been signed electronically. Number of Addenda: 0 Note Initiated On: 11/13/2016 3:15 PM Scope Withdrawal Time: 0 hours 12 minutes 47 seconds  Total Procedure Duration: 0 hours 18 minutes 47 seconds       [facility]

## 2016-11-14 ENCOUNTER — Encounter: Payer: Self-pay | Admitting: Unknown Physician Specialty

## 2016-11-15 LAB — SURGICAL PATHOLOGY

## 2016-11-21 ENCOUNTER — Telehealth: Payer: Self-pay | Admitting: Neurology

## 2016-11-21 NOTE — Telephone Encounter (Signed)
Patient calling stating since this past Sunday has a knot on her left side of neck and a knot on the right side of neck going into her hairline. The knots hurt to the touch and are very sore. The patient says she does not have a PCP. I advised patient per Katrina, Dr. Clydene Fake nurse she needs to go to urgent care with this problem. The patient said she would go to urgent care.

## 2017-01-26 ENCOUNTER — Telehealth: Payer: Self-pay | Admitting: Neurology

## 2017-01-26 ENCOUNTER — Other Ambulatory Visit: Payer: Self-pay | Admitting: Neurology

## 2017-01-26 DIAGNOSIS — G4452 New daily persistent headache (NDPH): Secondary | ICD-10-CM

## 2017-01-26 NOTE — Telephone Encounter (Signed)
thanks

## 2017-01-26 NOTE — Telephone Encounter (Signed)
Patient called after hours call center. I talked to her on the phone; she had seen Dr. Leonie Man for HAs and had been on tramadol prn and topamax, which helped and since HA subsided she stopped the meds. Reports recent onset of similar HA (recent stress what with mom's stroke and transfer to NH) and was wondering if she should take meds again. She has some tramadol left and was advised she can take those prn. She had not come back for FU and is advised to call on Monday to make a FU with Dr. Leonie Man. She verbalized understanding and agreement.

## 2017-01-30 NOTE — Telephone Encounter (Signed)
Yes can see NP

## 2017-01-30 NOTE — Telephone Encounter (Signed)
IF patient calls back she can see NP for headache issues per Dr. Leonie Man note below thanks.

## 2017-01-30 NOTE — Telephone Encounter (Signed)
Dr.Sethi pt was told to follow up as needed. Can pt see NP for headaches. There are no more refills on the tramadol. Pt is on topamax. Advise me thanks.

## 2017-02-13 ENCOUNTER — Ambulatory Visit: Payer: Self-pay | Admitting: Diagnostic Neuroimaging

## 2017-03-13 ENCOUNTER — Encounter: Payer: Self-pay | Admitting: Nurse Practitioner

## 2017-03-13 ENCOUNTER — Ambulatory Visit (INDEPENDENT_AMBULATORY_CARE_PROVIDER_SITE_OTHER): Payer: Self-pay | Admitting: Nurse Practitioner

## 2017-03-13 VITALS — BP 130/86 | HR 84 | Temp 98.3°F | Resp 16 | Ht 61.0 in | Wt 252.0 lb

## 2017-03-13 DIAGNOSIS — F3341 Major depressive disorder, recurrent, in partial remission: Secondary | ICD-10-CM | POA: Insufficient documentation

## 2017-03-13 DIAGNOSIS — F5104 Psychophysiologic insomnia: Secondary | ICD-10-CM

## 2017-03-13 DIAGNOSIS — F331 Major depressive disorder, recurrent, moderate: Secondary | ICD-10-CM | POA: Diagnosis not present

## 2017-03-13 DIAGNOSIS — F419 Anxiety disorder, unspecified: Secondary | ICD-10-CM | POA: Diagnosis not present

## 2017-03-13 MED ORDER — SERTRALINE HCL 50 MG PO TABS: 50.0000 mg | ORAL_TABLET | Freq: Every day | ORAL | 3 refills | Status: DC

## 2017-03-13 NOTE — Patient Instructions (Signed)
Denise Glass, Thank you for coming in to clinic today.  1. For your nerves: - START sertraline 50 mg once daily.   - This will probably also help with your sleep.  If not, we can re-evaluate this in 4 weeks and add another medication. - Try setting alarms and finding something that you schedule to occur in the morning.  This can be your regular gymnasium time. - You can try melatonin 5 mg once at bedtime when you are preparing for sleep. - I recommend you see a psychologist/counselor for a few months at minimum. Psych Counseling ONLY  Self Referral: 1. [facility] [place]   Address: [place], [place], [place] [address] Hours: Open today  9AM-7PM Phone: [phone]  2. [place], [place] Address: [address] [place], [place], [place] [address] Phone: [phone]    [place] Self Referral [facility]) [place] [address], [place], [place] [address] Phone: [phone]  [employer], available walk-in 9am-4pm M-F [place], [place] [address] Hours: [place] (M-F, walk in available) [phone]   2. For your headaches: - Continue Excedrin Migraine as needed, but this can cause rebound headaches. - Try an abortive therapy 600-800 mg ibuprofen (Motrin/Advil) for the next 7-14 days.  IF needed daily beyond 2 weeks, let me know.  Please schedule a follow-up appointment with [name], AGNP to Return in about 4 weeks (around 04/10/2017) for new medication anxiety/depression and Headaches; and August for Annual Physical.  If you have any other questions or concerns, please feel free to call the clinic or send a message through [place]. You may also schedule an earlier appointment if necessary.  [name], DNP, AGNP-BC Adult Gerontology Nurse Practitioner [facility] [place], [place] With Depression Everyone experiences  occasional disappointment, sadness, and loss in their lives. When you are feeling down, blue, or sad for at least 2 weeks in a row, it may mean that you have depression. Depression can affect your thoughts and feelings, relationships, daily activities, and physical health. It is caused by changes in the way your brain functions. If you receive a diagnosis of depression, your health care provider will tell you which type of depression you have and what treatment options are available to you. If you are living with depression, there are ways to help you recover from it and also ways to prevent it from coming back. How to cope with lifestyle changes Coping with stress  Stress is your body's reaction to life changes and events, both good and bad. Stressful situations may include:  Getting married.  The death of a spouse.  Losing a job.  Retiring.  Having a baby. Stress can last just a few hours or it can be ongoing. Stress can play a major role in depression, so it is important to learn both how to cope with stress and how to think about it differently. Talk with your health care provider or a counselor if you would like to learn more about stress reduction. He or she may suggest some stress reduction techniques, such as:  Music therapy. This can include creating music or listening to music. Choose music that you enjoy and that inspires you.  Mindfulness-based meditation. This kind of meditation can be done while sitting or walking. It involves being aware of your normal breaths, rather than trying to control your breathing.  Centering  prayer. This is a kind of meditation that involves focusing on a spiritual word or phrase. Choose a word, phrase, or sacred image that is meaningful to you and that brings you peace.  Deep breathing. To do this, expand your stomach and inhale slowly through your nose. Hold your breath for 3-5 seconds, then exhale slowly, allowing your stomach muscles to  relax.  Muscle relaxation. This involves intentionally tensing muscles then relaxing them. Choose a stress reduction technique that fits your lifestyle and personality. Stress reduction techniques take time and practice to develop. Set aside 5-15 minutes a day to do them. Therapists can offer training in these techniques. The training may be covered by some insurance plans. Other things you can do to manage stress include:  Keeping a stress diary. This can help you learn what triggers your stress and ways to control your response.  Understanding what your limits are and saying no to requests or events that lead to a schedule that is too full.  Thinking about how you respond to certain situations. You may not be able to control everything, but you can control how you react.  Adding humor to your life by watching funny films or TV shows.  Making time for activities that help you relax and not feeling guilty about spending your time this way. Medicines  Your health care provider may suggest certain medicines if he or she feels that they will help improve your condition. Avoid using alcohol and other substances that may prevent your medicines from working properly (may interact). It is also important to:  Talk with your pharmacist or health care provider about all the medicines that you take, their possible side effects, and what medicines are safe to take together.  Make it your goal to take part in all treatment decisions (shared decision-making). This includes giving input on the side effects of medicines. It is best if shared decision-making with your health care provider is part of your total treatment plan. If your health care provider prescribes a medicine, you may not notice the full benefits of it for 4-8 weeks. Most people who are treated for depression need to be on medicine for at least 6-12 months after they feel better. If you are taking medicines as part of your treatment, do not stop  taking medicines without first talking to your health care provider. You may need to have the medicine slowly decreased (tapered) over time to decrease the risk of harmful side effects. Relationships  Your health care provider may suggest family therapy along with individual therapy and drug therapy. While there may not be family problems that are causing you to feel depressed, it is still important to make sure your family learns as much as they can about your mental health. Having your family's support can help make your treatment successful. How to recognize changes in your condition Everyone has a different response to treatment for depression. Recovery from major depression happens when you have not had signs of major depression for two months. This may mean that you will start to:  Have more interest in doing activities.  Feel less hopeless than you did 2 months ago.  Have more energy.  Overeat less often, or have better or improving appetite.  Have better concentration. Your health care provider will work with you to decide the next steps in your recovery. It is also important to recognize when your condition is getting worse. Watch for these signs:  Having fatigue or low energy.  Eating too much or too little.  Sleeping too much or too little.  Feeling restless, agitated, or hopeless.  Having trouble concentrating or making decisions.  Having unexplained physical complaints.  Feeling irritable, angry, or aggressive. Get help as soon as you or your family members notice these symptoms coming back. How to get support and help from others How to talk with friends and family members about your condition  Talking to friends and family members about your condition can provide you with one way to get support and guidance. Reach out to trusted friends or family members, explain your symptoms to them, and let them know that you are working with a health care provider to treat your  depression. Financial resources  Not all insurance plans cover mental health care, so it is important to check with your insurance carrier. If paying for co-pays or counseling services is a problem, search for a local or county mental health care center. They may be able to offer public mental health care services at low or no cost when you are not able to see a private health care provider. If you are taking medicine for depression, you may be able to get the generic form, which may be less expensive. Some makers of prescription medicines also offer help to patients who cannot afford the medicines they need. Follow these instructions at home:  Get the right amount and quality of sleep.  Cut down on using caffeine, tobacco, alcohol, and other potentially harmful substances.  Try to exercise, such as walking or lifting small weights.  Take over-the-counter and prescription medicines only as told by your health care provider.  Eat a healthy diet that includes plenty of vegetables, fruits, whole grains, low-fat dairy products, and lean protein. Do not eat a lot of foods that are high in solid fats, added sugars, or salt.  Keep all follow-up visits as told by your health care provider. This is important. Contact a health care provider if:  You stop taking your antidepressant medicines, and you have any of these symptoms:  Nausea.  Headache.  Feeling lightheaded.  Chills and body aches.  Not being able to sleep (insomnia).  You or your friends and family think your depression is getting worse. Get help right away if:  You have thoughts of hurting yourself or others. If you ever feel like you may hurt yourself or others, or have thoughts about taking your own life, get help right away. You can go to your nearest emergency department or call:  Your local emergency services (911 in the [place]  A suicide crisis helpline, such as the [facility] at  302-276-3104. This is open 24-hours a day. Summary  If you are living with depression, there are ways to help you recover from it and also ways to prevent it from coming back.  Work with your health care team to create a management plan that includes counseling, stress management techniques, and healthy lifestyle habits. This information is not intended to replace advice given to you by your health care provider. Make sure you discuss any questions you have with your health care provider. Document Released: 09/04/2016 Document Revised: 09/04/2016 Document Reviewed: 09/04/2016 Elsevier Interactive Patient Education  2017 [employer].

## 2017-03-13 NOTE — Progress Notes (Signed)
Subjective:    Patient ID: Denise Glass, female    DOB: 1955/12/02, 61 y.o.   MRN: [id number]  Denise Glass is a 61 y.o. female presenting on 03/13/2017 for New Patient (Initial Visit) (Patient here today to establish care, patient is transferring care from [facility]. ) and Insomnia (Patient C/O worsening insomnia for several weeks. Patient does have Ambien and reports fair complaince with medication. Patient reports taking Ambien only once a week only, pt concerned that if she takes every night the medication will stop working for her.)   HPI  Establish Care Previous PCP was at [facility] where her previous providers had left the clinic.   Depression Poor support system: Has been estranged from family - Mother and Brother.  In August, she will be 7 years widowed.      Sometimes crying is uncontrollable r/t family drama/estragement which has been worse in April - May since she saw her Mother.  Had flu on March 20th - Sick for 3 weeks and things started "going downhill."   Pt states, "I don't have motivation to do anything -shop, work in yard" She also states she feels jittery all the time.  Spends days doing "lots of nothing."  Watches TV most of the day.  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  Denies financial stress r/t continued income.  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     Had kept grandson on Mondays in past before he started school.  That provided purpose to her days.    She is exercising 2x at the gym last week and uses the treadmill 20-30 minutes 4 days per week.  Insomnia Trouble w/ sleep after retirement with no schedule.  Sleeping worse x last 6 mos and even worse after flu.  She has trouble falling asleep and staying asleep.  She does not take ambien every night, but does take two 5 mg tablets and 2 benadryl on nights that she does take it.  She finds herself desperate to sleep and feels she needs to take this much.  Pt states she also has  OCD and scratches head and has sore spots - Does it without even thinking.  Woke up last night scratching head with spots bleeding.  Depression screen PHQ 2/9 03/13/2017  Decreased Interest 3  Down, Depressed, Hopeless 3  PHQ - 2 Score 6  Altered sleeping 3  Tired, decreased energy 3  Change in appetite 1  Feeling bad or failure about yourself  3  Trouble concentrating 3  Moving slowly or fidgety/restless 0  Suicidal thoughts 0  PHQ-9 Score 19  Difficult doing work/chores Very difficult    GAD 7 : Generalized Anxiety Score 03/13/2017  Nervous, Anxious, on Edge 3  Control/stop worrying 3  Worry too much - different things 0  Trouble relaxing 1  Restless 0  Easily annoyed or irritable 0  Afraid - awful might happen 1  Total GAD 7 Score 8  Anxiety Difficulty Not difficult at all     Past Medical History:  Diagnosis Date  . DJD (degenerative joint disease)   . GERD (gastroesophageal reflux disease)   . Headache   . Insomnia   . Mitral valve prolapse 2003   Followed by Dr. Clayborn Bigness (prn)  . Urine frequency    Past Surgical History:  Procedure Laterality Date  . BREAST BIOPSY  2001   Calcification - bx done by Dr. Raylene Everts  . BREAST SURGERY  2001   biopsy  . COLONOSCOPY WITH PROPOFOL N/A 11/13/2016   Procedure: COLONOSCOPY WITH PROPOFOL;  Surgeon: Manya Silvas, MD;  Location: [facility] ENDOSCOPY;  Service: Endoscopy;  Laterality: N/A;  . JOINT REPLACEMENT  2008   left and right knee replaced  . KNEE ARTHROPLASTY  2007   Right Knee  . KNEE ARTHROPLASTY  2008   Left Knee  . left total knee replacement    . right total knee    . TUBAL LIGATION     Social History   Social History  . Marital status: Widowed    Spouse name: N/A  . Number of children: N/A  . Years of education: N/A   Occupational History  . retired    Social History Main Topics  . Smoking status: Never Smoker  . Smokeless tobacco: Never Used  . Alcohol use No  . Drug use: No  . Sexual  activity: Not Currently   Other Topics Concern  . Not on file   Social History Narrative   Patient is a 61 y/o widow (2011) who lives independently at home. 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 She has one son, a daughter-in-law and a grandson that live locally.  She struggles with her relationships with mother and brother.   Family History  Problem Relation Age of Onset  . Heart disease Father   . COPD Father   . Cancer Father        throat  . COPD Mother   . Heart disease Mother   . Stroke Mother   . Healthy Brother   . Healthy Son   . Drug abuse Brother    Current Outpatient Prescriptions on File Prior to Visit  Medication Sig  . omeprazole (PRILOSEC) 20 MG capsule Take 20 mg by mouth daily.  Marland Kitchen zolpidem (AMBIEN) 5 MG tablet TAKE 1 TABLET AT BEDTIME AS NEEDED FOR SLEEP  . topiramate (TOPAMAX) 50 MG tablet Take 1 tablet (50 mg total) by mouth 2 (two) times daily. Start one tablet daily x 1 week then twice daily (Patient not taking: Reported on 03/13/2017)   No current facility-administered medications on file prior to visit.     Review of Systems  Constitutional: Negative.   HENT: Negative.   Eyes: Negative.   Respiratory: Negative.   Cardiovascular: Negative.   Gastrointestinal:       Heartburn  Endocrine: Negative.   Genitourinary: Positive for urgency.       Urge incontinence  Musculoskeletal: Negative.   Skin: Positive for wound.       Scalp lesions  Allergic/Immunologic: Negative.   Neurological: Positive for headaches.       Daily headaches  Psychiatric/Behavioral: Positive for sleep disturbance. Negative for agitation, behavioral problems, confusion, decreased concentration, dysphoric mood, hallucinations, self-injury and suicidal ideas. The patient is nervous/anxious. The patient is not hyperactive.    Per HPI unless specifically indicated above      Objective:    BP 130/86   Pulse 84   Temp 98.3 F (36.8 C)  (Oral)   Resp 16   Ht 5\' 1"  (1.549 m)   Wt 252 lb (114.3 kg)   SpO2 99%   BMI 47.61 kg/m   Wt Readings from Last 3 Encounters:  03/13/17 252 lb (114.3 kg)  11/13/16 250 lb (113.4 kg)  06/01/16 242 lb 3.2 oz (109.9 kg)    Physical Exam  Constitutional: She is oriented to person, place, and time. She appears well-developed  and well-nourished. No distress.  HENT:  Head: Normocephalic and atraumatic.  Cardiovascular: Normal rate, regular rhythm, normal heart sounds and intact distal pulses.   Pulmonary/Chest: Effort normal and breath sounds normal.  Abdominal: Soft. Bowel sounds are normal. She exhibits no distension and no mass. There is no tenderness.  Neurological: She is alert and oriented to person, place, and time. She has normal strength. She displays no tremor.  Skin: Skin is warm and dry.  Scalp w/ multiple lesions in various stages healing.  most are scabbed over.  Mostly at midline near part in hair.  One near occiput r side.  Psychiatric: Her speech is normal. Her mood appears anxious. She expresses no homicidal and no suicidal ideation. She expresses no suicidal plans and no homicidal plans.  Easily tearful with discussion of stresses, family dynamics and loss of husband. She is attentive.  Vitals reviewed.       Assessment & Plan:   Problem List Items Addressed This Visit      Other   Insomnia Difficulty with onset of sleep, staying asleep.  Pt now without motivator to get out of bed r/t nothing scheduled outside home.  Plan: 1. Encouraged pt to set alarm and schedule something in the mornings. 2. Practice good sleep hygiene. 3. Only take single ambien if needed. 4. Treat depression/anxiety - conditions that can complicate sleep patterns.   Relevant Medications   sertraline (ZOLOFT) 50 MG tablet   Anxiety   Moderate episode of recurrent major depressive disorder ([place]) - Primary    Pt with depression today characterized by anhedonia, tearfulness, and insomnia.   Pt expresses desire that she needs to do something different to treat her down moods.  Plan: 1. Start sertraline 50 mg once daily. 2. Encouraged pt to begin more regular exercise program that is scheduled to encourage her to get up and out of her home. 3. May find other ways to connect with other people like volunteering. 4. Follow up 4 weeks.      Relevant Medications   sertraline (ZOLOFT) 50 MG tablet      Meds ordered this encounter  Medications  . oxybutynin (DITROPAN-XL) 10 MG 24 hr tablet    Sig: Take 10 mg by mouth at bedtime.  . sertraline (ZOLOFT) 50 MG tablet    Sig: Take 1 tablet (50 mg total) by mouth daily.    Dispense:  30 tablet    Refill:  3    Order Specific Question:   Supervising Provider    Answer:   Olin Hauser [[id number]]      Follow up plan: Return in about 4 weeks (around 04/10/2017) for new medication anxiety/depression and Headaches; and August for Annual Physical.  [name], DNP, AGPCNP-BC Adult Gerontology Primary Care Nurse Practitioner [facility] 03/15/2017, 7:51 PM

## 2017-03-15 ENCOUNTER — Encounter: Payer: Self-pay | Admitting: Nurse Practitioner

## 2017-03-15 NOTE — Assessment & Plan Note (Signed)
Pt with depression today characterized by anhedonia, tearfulness, and insomnia.  Pt expresses desire that she needs to do something different to treat her down moods.  Plan: 1. Start sertraline 50 mg once daily. 2. Encouraged pt to begin more regular exercise program that is scheduled to encourage her to get up and out of her home. 3. May find other ways to connect with other people like volunteering. 4. Follow up 4 weeks.

## 2017-03-15 NOTE — Progress Notes (Signed)
I have reviewed this encounter including the documentation in this note and/or discussed this patient with the provider, [name], AGPCNP-BC. I am certifying that I agree with the content of this note as supervising physician.  [name], DO [facility] 03/15/2017, 8:52 PM

## 2017-03-22 ENCOUNTER — Ambulatory Visit: Payer: Self-pay | Admitting: Family Medicine

## 2017-03-30 ENCOUNTER — Telehealth: Payer: Self-pay

## 2017-03-30 NOTE — Telephone Encounter (Signed)
Patient called reporting that Dr. Clayborn Bigness called in Metoprolol 25mg  BID the day she last seen you.  She did not pick up RX and would like for you to call her back to discuss if she needs the BP med.

## 2017-03-30 NOTE — Telephone Encounter (Signed)
When she picked up zoloft, she had a prescription for metoprolol from Dr. Clayborn Bigness. Pt was unaware she needed to be taking it.  Currently having shortness of breath. Explained that metoprolol is not only for BP control, but also for HR and rhythm.  Reviewed side effects of lightheadedness and dizziness. If occur, to call Dr. Clayborn Bigness for advice about that medication. Encouraged her she should take twice daily and not skip doses even if she feels it may be too much medication.  Pt verbalizes understanding and states she will start it in the next 2-3 days.

## 2017-04-05 ENCOUNTER — Ambulatory Visit (INDEPENDENT_AMBULATORY_CARE_PROVIDER_SITE_OTHER): Payer: BLUE CROSS/BLUE SHIELD | Admitting: Nurse Practitioner

## 2017-04-05 ENCOUNTER — Encounter: Payer: Self-pay | Admitting: Nurse Practitioner

## 2017-04-05 VITALS — BP 124/86 | HR 58 | Temp 98.3°F | Resp 16 | Ht 61.0 in | Wt 246.0 lb

## 2017-04-05 DIAGNOSIS — F331 Major depressive disorder, recurrent, moderate: Secondary | ICD-10-CM

## 2017-04-05 DIAGNOSIS — Z1231 Encounter for screening mammogram for malignant neoplasm of breast: Secondary | ICD-10-CM

## 2017-04-05 DIAGNOSIS — F5104 Psychophysiologic insomnia: Secondary | ICD-10-CM

## 2017-04-05 DIAGNOSIS — F419 Anxiety disorder, unspecified: Secondary | ICD-10-CM

## 2017-04-05 DIAGNOSIS — Z1239 Encounter for other screening for malignant neoplasm of breast: Secondary | ICD-10-CM

## 2017-04-05 MED ORDER — SERTRALINE HCL 100 MG PO TABS: 50.0000 mg | ORAL_TABLET | Freq: Every day | ORAL | 0 refills | Status: DC

## 2017-04-05 MED ORDER — ZOLPIDEM TARTRATE 5 MG PO TABS: 5.0000 mg | ORAL_TABLET | Freq: Every evening | ORAL | 0 refills | Status: DC | PRN

## 2017-04-05 MED ORDER — OXYBUTYNIN CHLORIDE ER 10 MG PO TB24: 10.0000 mg | ORAL_TABLET | Freq: Every day | ORAL | 1 refills | Status: DC

## 2017-04-05 NOTE — Assessment & Plan Note (Addendum)
Pt with anxiety and depression today improved with less anhedonia, almost no tearfulness, and improvement w/ less frequent insomnia.  W/ improved moods, has also lost 4 lbs in 3.5 weeks.  Pt with partial response w/ minimal improvement in PHQ9 and GAD 7 scores.  No experienced side effects, so desires to continue sertraline at increased dose for more improvement.  Pt discouraged that she feels it isn't helping at this current dose.    Plan: 1. Increase sertraline to 100 mg once daily.  Encouraged to give 6 weeks for full response to this dose. Can increase to max dose 200 mg daily if needed.  Declined buspar for now.  May need two agents or transition to SNRI in future if no complete response at higher sertraline doses. 2. Encouraged pt to begin more regular exercise program that is scheduled to encourage her to get up and out of her home. 3. Follow up 4 weeks.

## 2017-04-05 NOTE — Assessment & Plan Note (Signed)
Pt w/ improving insomnia.  NO longer requiring ambien nightly.  Takes 5 mg if difficulty with falling asleep.  Plan: 1. Continue ambien 5 mg hs prn onset of sleep. 2. Follow up 3 months.

## 2017-04-05 NOTE — Patient Instructions (Addendum)
Jasilyn, Thank you for coming in to clinic today.  1. For your anxiety and depression: - INCREASE sertraline to 100 mg once daily.  New refill will only need 1 tablet daily.  Until then, take 2 tablets.  2. For your blood pressure:  - CONTINUE metoprolol 25 mg twice daily.  3. For your insomnia: - continue ambien 5 mg nightly as needed for onset of sleep.  4. For overactive bladder: - Continue oxybutynin (24 hr tablet) 10 mg once nightly.  5. You will be due for FASTING BLOOD WORK (no food or drink after midnight before, only water or coffee without cream/sugar on the morning of)  - You have a "Lab Only" visit in the morning at the clinic for lab draw on AUGUST 16th before your next Annual Physical  For Lab Results, once available within 2-3 days of blood draw, you can can log in to MyChart online to view your results and a brief explanation. Also, we can discuss results at next follow-up visit.    Please schedule a follow-up appointment with [name], AGNP to Return for at your annual physical for anxiety and depression.  If you have any other questions or concerns, please feel free to call the clinic or send a message through [place]. You may also schedule an earlier appointment if necessary.  [name], DNP, AGNP-BC Adult Gerontology Nurse Practitioner [place]

## 2017-04-05 NOTE — Progress Notes (Signed)
Subjective:    Patient ID: Denise Glass, female    DOB: 1956/06/20, 61 y.o.   MRN: [id number]  RALEY Glass is a 61 y.o. female presenting on 04/05/2017 for Depression (not improved with meds ) and Anxiety (little improved and can sleep now)   HPI Anxiety and Depression  Notes decrease in crying.  Still only 3.5 weeks since starting sertraline 50 mg once daily.  No significant improvement in PHQ.  Has not started exercising yet.  Thought she was getting worse, b/c would get coffee sit on couch and not watch tv.  Now feeling better and noticing fewer racing thoughts.  Can keep focus.  No tearfulness.   Insomnia Pt is noting improvement with sleep.  Also desires refill of ambien.  Notes sleeping is better for 3-4 days periods w/o assistance.    Hypertension Pt is feeling well.   Current medications: metoprolol 25 mg bid, tolerating well without side effects. Pt had previously been having palpitations and racing heart.  None since starting metoprolol. Pt denies headache, lightheadedness, dizziness, changes in vision, chest tightness/pressure, palpitations, leg swelling, sudden loss of speech or loss of consciousness.   Overactive Bladder Repeated urges w/o actually needing to void.  No urge to go unless needing to void on oxybutin 10 mg 24-hr.  Denies incontinence.   Depression screen [facility] 2/9 04/05/2017 03/13/2017  Decreased Interest 3 3  Down, Depressed, Hopeless 3 3  PHQ - 2 Score 6 6  Altered sleeping 3 3  Tired, decreased energy 3 3  Change in appetite 3 1  Feeling bad or failure about yourself  3 3  Trouble concentrating 2 3  Moving slowly or fidgety/restless 1 0  Suicidal thoughts 1 0  PHQ-9 Score 22 19  Difficult doing work/chores Not difficult at all Very difficult   GAD 7 : Generalized Anxiety Score 04/05/2017 03/13/2017  Nervous, Anxious, on Edge 1 3  Control/stop worrying 3 3  Worry too much - different things 3 0  Trouble relaxing 0 1  Restless 0 0  Easily  annoyed or irritable 0 0  Afraid - awful might happen 0 1  Total GAD 7 Score 7 8  Anxiety Difficulty Not difficult at all Not difficult at all   Social History  Substance Use Topics  . Smoking status: Never Smoker  . Smokeless tobacco: Never Used  . Alcohol use No    Review of Systems Per HPI unless specifically indicated above    Objective:    BP 124/86   Pulse (!) 58   Temp 98.3 F (36.8 C) (Oral)   Resp 16   Ht 5\' 1"  (1.549 m)   Wt 246 lb (111.6 kg)   SpO2 98%   BMI 46.48 kg/m    Wt Readings from Last 3 Encounters:  04/05/17 246 lb (111.6 kg)  03/13/17 252 lb (114.3 kg)  11/13/16 250 lb (113.4 kg)    Physical Exam  Constitutional: She is oriented to person, place, and time. She appears well-developed and well-nourished. No distress.  HENT:  Head: Normocephalic and atraumatic.  Mouth/Throat: Oropharynx is clear and moist.  Eyes: Conjunctivae are normal. Pupils are equal, round, and reactive to light.  Neck: Normal range of motion. Neck supple.  Cardiovascular: Normal rate, regular rhythm and normal heart sounds.   Pulmonary/Chest: Effort normal and breath sounds normal.  Abdominal: Soft. Bowel sounds are normal. She exhibits no distension and no mass. There is no tenderness.  Neurological: She is alert and oriented  to person, place, and time. No cranial nerve deficit.  Skin: Skin is warm and dry.  Psychiatric: She has a normal mood and affect. Her behavior is normal. Judgment and thought content normal.  Vitals reviewed.      Assessment & Plan:   Problem List Items Addressed This Visit      Other   Insomnia    Pt w/ improving insomnia.  NO longer requiring ambien nightly.  Takes 5 mg if difficulty with falling asleep.  Plan: 1. Continue ambien 5 mg hs prn onset of sleep. 2. Follow up 3 months.      Relevant Medications   sertraline (ZOLOFT) 100 MG tablet   Anxiety   Moderate episode of recurrent major depressive disorder (HCC) - Primary    Pt with  anxiety and depression today improved with less anhedonia, almost no tearfulness, and improvement w/ less frequent insomnia.  W/ improved moods, has also lost 4 lbs in 3.5 weeks.  Pt with partial response w/ minimal improvement in PHQ9 and GAD 7 scores.  No experienced side effects, so desires to continue sertraline at increased dose for more improvement.  Pt discouraged that she feels it isn't helping at this current dose.    Plan: 1. Increase sertraline to 100 mg once daily.  Encouraged to give 6 weeks for full response to this dose. Can increase to max dose 200 mg daily if needed.  Declined buspar for now.  May need two agents or transition to SNRI in future if no complete response at higher sertraline doses. 2. Encouraged pt to begin more regular exercise program that is scheduled to encourage her to get up and out of her home. 3. Follow up 4 weeks.      Relevant Medications   sertraline (ZOLOFT) 100 MG tablet    Other Visit Diagnoses    Breast cancer screening       Pt gets mammograms annually.  Needs 3D for breast calcifications as recommended at last mammogram.  Plan: 1. 3D mammo order placed.   Relevant Orders   MM DIGITAL SCREENING BILATERAL      Meds ordered this encounter  Medications  . metoprolol tartrate (LOPRESSOR) 25 MG tablet    Sig: Take 25 mg by mouth 2 (two) times daily.    Refill:  11  . DISCONTD: oxybutynin (DITROPAN-XL) 10 MG 24 hr tablet    Sig: Take by mouth.  . sertraline (ZOLOFT) 100 MG tablet    Sig: Take 0.5 tablets (50 mg total) by mouth daily.    Dispense:  90 tablet    Refill:  0  . DISCONTD: zolpidem (AMBIEN) 5 MG tablet    Sig: Take 1 tablet (5 mg total) by mouth at bedtime as needed. for sleep    Dispense:  90 tablet    Refill:  0  . oxybutynin (DITROPAN-XL) 10 MG 24 hr tablet    Sig: Take 1 tablet (10 mg total) by mouth at bedtime.    Dispense:  90 tablet    Refill:  1    NO FILL now.  Refill when pt requests.  Marland Kitchen zolpidem (AMBIEN) 5 MG  tablet    Sig: Take 1 tablet (5 mg total) by mouth at bedtime as needed. for sleep    Dispense:  90 tablet    Refill:  0      Follow up plan: Return for at your annual physical for anxiety and depression.  Pt verbalizes understanding that it will be a copay visit.  [name], DNP, AGPCNP-BC Adult Gerontology Primary Care Nurse Practitioner [facility] 04/05/2017, 9:11 AM

## 2017-04-09 NOTE — Progress Notes (Signed)
I have reviewed this encounter including the documentation in this note and/or discussed this patient with the provider, [name], AGPCNP-BC. I am certifying that I agree with the content of this note as supervising physician.  [name], [facility] 04/09/2017, 1:56 PM

## 2017-04-12 ENCOUNTER — Telehealth: Payer: Self-pay | Admitting: Nurse Practitioner

## 2017-04-12 ENCOUNTER — Other Ambulatory Visit: Payer: Self-pay

## 2017-04-12 DIAGNOSIS — F5104 Psychophysiologic insomnia: Secondary | ICD-10-CM

## 2017-04-12 DIAGNOSIS — F331 Major depressive disorder, recurrent, moderate: Secondary | ICD-10-CM

## 2017-04-12 DIAGNOSIS — F419 Anxiety disorder, unspecified: Secondary | ICD-10-CM

## 2017-04-12 MED ORDER — SERTRALINE HCL 100 MG PO TABS: 100.0000 mg | ORAL_TABLET | Freq: Every day | ORAL | 0 refills | Status: DC

## 2017-04-12 NOTE — Telephone Encounter (Signed)
Pt. Called states she a question about her Zoloft. Pt call back # is [phone]

## 2017-04-12 NOTE — Telephone Encounter (Signed)
Pt called with concerns about her Zoloft prescription. She picked up the new script and it 100MG  w/ the directions to take half tablet daily. In the office note it states Increase sertraline to 100 mg once daily.  Encouraged to give 6 weeks for full response to this dose. Can increase to max dose 200 mg daily if needed. Prescription corrected.

## 2017-05-04 ENCOUNTER — Telehealth: Payer: Self-pay | Admitting: Nurse Practitioner

## 2017-05-04 DIAGNOSIS — F331 Major depressive disorder, recurrent, moderate: Secondary | ICD-10-CM

## 2017-05-04 DIAGNOSIS — F419 Anxiety disorder, unspecified: Secondary | ICD-10-CM

## 2017-05-04 DIAGNOSIS — F5104 Psychophysiologic insomnia: Secondary | ICD-10-CM

## 2017-05-04 MED ORDER — SERTRALINE HCL 50 MG PO TABS: 50.0000 mg | ORAL_TABLET | Freq: Every day | ORAL | 2 refills | Status: DC

## 2017-05-04 MED ORDER — BUSPIRONE HCL 5 MG PO TABS: 5.0000 mg | ORAL_TABLET | Freq: Two times a day (BID) | ORAL | 2 refills | Status: DC

## 2017-05-04 NOTE — Telephone Encounter (Signed)
Pt attributes sweats to her metoprolol, but is not new medication for her.  Increased sertraline from 50 to 100 mg once daily and is likely cause.  Reduce sertraline back to 50 mg once daily START buspirone 5 mg bid.  Pt notes improved tearfulness, but persistent episodes of panic that occur suddenly even when doing simple housework tasks. Pt verbalizes understanding for medication changes and will continue taking metoprolol 25 mg bid.

## 2017-05-04 NOTE — Telephone Encounter (Signed)
Pt. Called states that the med that was given to her was making her sweat. {t call back  # is [phone]

## 2017-05-10 ENCOUNTER — Other Ambulatory Visit: Payer: Self-pay

## 2017-05-10 DIAGNOSIS — F419 Anxiety disorder, unspecified: Secondary | ICD-10-CM

## 2017-05-10 DIAGNOSIS — F5104 Psychophysiologic insomnia: Secondary | ICD-10-CM

## 2017-05-10 DIAGNOSIS — F331 Major depressive disorder, recurrent, moderate: Secondary | ICD-10-CM

## 2017-05-10 MED ORDER — SERTRALINE HCL 50 MG PO TABS: 50.0000 mg | ORAL_TABLET | Freq: Every day | ORAL | 1 refills | Status: DC

## 2017-05-10 NOTE — Telephone Encounter (Signed)
The pt  is requesting a 90 day script. Please advise

## 2017-05-21 ENCOUNTER — Telehealth: Payer: Self-pay

## 2017-05-21 NOTE — Telephone Encounter (Signed)
The pt called complaining of insomnia x 2 days. She also stress she is feeling hot and sweaty with chest pains that started today. I informed the pt that she need to contact EMS right away. The pt informed me that she was home alone and would contact them. We disconnected the call and I called the pt daughter-n-law Rosa and informed her of the  phone call.

## 2017-05-21 NOTE — Telephone Encounter (Signed)
Pt should have low threshold for calling 9-1-1 again for ED visit and full cardiac workup. If persistent problem, should be seen in clinic and by cardiology.  Encourage adequate sleep hygiene.  May need to change ambien.

## 2017-05-21 NOTE — Telephone Encounter (Signed)
The pt called back and to inform me that EMS just left and everything checked out okay. She said they did a EKG which was negative, bp 150/90 pulse 90. The chest pain have subsided and they feel like she just got over heated while she was out running errands. She said she is feeling much better now and think she just need to get some rest. She said she haven't been able to sleep good the last couple of days.

## 2017-05-30 ENCOUNTER — Other Ambulatory Visit: Payer: Self-pay | Admitting: Nurse Practitioner

## 2017-05-30 DIAGNOSIS — Z0000 Encounter for general adult medical examination without abnormal findings: Secondary | ICD-10-CM

## 2017-05-31 ENCOUNTER — Other Ambulatory Visit: Payer: BLUE CROSS/BLUE SHIELD

## 2017-05-31 DIAGNOSIS — Z0000 Encounter for general adult medical examination without abnormal findings: Secondary | ICD-10-CM

## 2017-05-31 LAB — CBC WITH DIFFERENTIAL/PLATELET
Basophils Absolute: 66 cells/uL (ref 0–200)
Basophils Relative: 1 %
Eosinophils Absolute: 198 cells/uL (ref 15–500)
Eosinophils Relative: 3 %
HCT: 41.2 % (ref 35.0–45.0)
Hemoglobin: 13.5 g/dL (ref 11.7–15.5)
Lymphocytes Relative: 25 %
Lymphs Abs: 1650 cells/uL (ref 850–3900)
MCH: 29.8 pg (ref 27.0–33.0)
MCHC: 32.8 g/dL (ref 32.0–36.0)
MCV: 90.9 fL (ref 80.0–100.0)
MPV: 10.4 fL (ref 7.5–12.5)
Monocytes Absolute: 528 cells/uL (ref 200–950)
Monocytes Relative: 8 %
Neutro Abs: 4158 cells/uL (ref 1500–7800)
Neutrophils Relative %: 63 %
Platelets: 280 10*3/uL (ref 140–400)
RBC: 4.53 MIL/uL (ref 3.80–5.10)
RDW: 14.6 % (ref 11.0–15.0)
WBC: 6.6 10*3/uL (ref 3.8–10.8)

## 2017-05-31 LAB — TSH: TSH: 4.86 mIU/L — ABNORMAL HIGH

## 2017-06-01 LAB — LIPID PANEL
Cholesterol: 238 mg/dL — ABNORMAL HIGH (ref <200)
HDL: 56 mg/dL (ref >50)
LDL Cholesterol: 151 mg/dL — ABNORMAL HIGH (ref <100)
Total CHOL/HDL Ratio: 4.3 Ratio (ref <5.0)
Triglycerides: 156 mg/dL — ABNORMAL HIGH (ref <150)
VLDL: 31 mg/dL — ABNORMAL HIGH (ref <30)

## 2017-06-01 LAB — COMPREHENSIVE METABOLIC PANEL
ALT: 11 U/L (ref 6–29)
AST: 14 U/L (ref 10–35)
Albumin: 3.9 g/dL (ref 3.6–5.1)
Alkaline Phosphatase: 82 U/L (ref 33–130)
BUN: 12 mg/dL (ref 7–25)
CO2: 22 mmol/L (ref 20–32)
Calcium: 9.2 mg/dL (ref 8.6–10.4)
Chloride: 105 mmol/L (ref 98–110)
Creat: 0.82 mg/dL (ref 0.50–0.99)
Glucose, Bld: 96 mg/dL (ref 65–99)
Potassium: 3.8 mmol/L (ref 3.5–5.3)
Sodium: 141 mmol/L (ref 135–146)
Total Bilirubin: 0.4 mg/dL (ref 0.2–1.2)
Total Protein: 6 g/dL — ABNORMAL LOW (ref 6.1–8.1)

## 2017-06-01 LAB — HEMOGLOBIN A1C
Hgb A1c MFr Bld: 5.7 % — ABNORMAL HIGH (ref <5.7)
Mean Plasma Glucose: 117 mg/dL

## 2017-06-05 ENCOUNTER — Encounter: Payer: Self-pay | Admitting: Nurse Practitioner

## 2017-06-05 ENCOUNTER — Telehealth: Payer: Self-pay

## 2017-06-05 ENCOUNTER — Ambulatory Visit (INDEPENDENT_AMBULATORY_CARE_PROVIDER_SITE_OTHER): Payer: BLUE CROSS/BLUE SHIELD | Admitting: Nurse Practitioner

## 2017-06-05 ENCOUNTER — Other Ambulatory Visit: Payer: Self-pay | Admitting: Nurse Practitioner

## 2017-06-05 VITALS — BP 131/87 | HR 72 | Temp 97.5°F | Ht 61.0 in | Wt 243.8 lb

## 2017-06-05 DIAGNOSIS — Z0000 Encounter for general adult medical examination without abnormal findings: Secondary | ICD-10-CM | POA: Diagnosis not present

## 2017-06-05 DIAGNOSIS — Z780 Asymptomatic menopausal state: Secondary | ICD-10-CM

## 2017-06-05 DIAGNOSIS — E039 Hypothyroidism, unspecified: Secondary | ICD-10-CM

## 2017-06-05 DIAGNOSIS — Z124 Encounter for screening for malignant neoplasm of cervix: Secondary | ICD-10-CM | POA: Diagnosis not present

## 2017-06-05 DIAGNOSIS — Z13820 Encounter for screening for osteoporosis: Secondary | ICD-10-CM

## 2017-06-05 DIAGNOSIS — D229 Melanocytic nevi, unspecified: Secondary | ICD-10-CM

## 2017-06-05 DIAGNOSIS — Z1231 Encounter for screening mammogram for malignant neoplasm of breast: Secondary | ICD-10-CM | POA: Diagnosis not present

## 2017-06-05 DIAGNOSIS — Z1239 Encounter for other screening for malignant neoplasm of breast: Secondary | ICD-10-CM

## 2017-06-05 MED ORDER — ZOLPIDEM TARTRATE 10 MG PO TABS: 10.0000 mg | ORAL_TABLET | Freq: Every evening | ORAL | 5 refills | Status: DC | PRN

## 2017-06-05 MED ORDER — BUSPIRONE HCL 5 MG PO TABS: 10.0000 mg | ORAL_TABLET | Freq: Two times a day (BID) | ORAL | 2 refills | Status: DC

## 2017-06-05 NOTE — Progress Notes (Signed)
Subjective:    Patient ID: Denise Glass, female    DOB: 1956-05-27, 61 y.o.   MRN: [id number]  TAINA Glass is a 61 y.o. female presenting on 06/05/2017 for Annual Exam   HPI  Annual Physical Exam Patient has been feeling well and healthy.  Still has low energy some days and feels like she is improving.  Will be having a hip replacement for right hip.  They have no other acute concerns today. Still not sleeping every night, but gets 8+ hours per night when she sleeps interrupted once.  Takes two [place] when she needs it for sleep and needs to take [place] most nights.  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  Only taking meds most once daily.   HEALTH MAINTENANCE: Weight/BMI: down 10 lbs r/t poor appetite Physical activity: none - r/t hip pain and one fall - uses cane and walker Diet: eats about one meal per day and a small snack Seatbelt: always Sunscreen: none PAP: August 2015 - NO HPV check - normal at that time. Mammogram: due for 3D mammogram this year DEXA: last more than 5 years ago   Colonoscopy: Nov 13, 2016  VACCINES: Tetanus: up to date Influenza: declines today   Past Medical History:  Diagnosis Date  . DJD (degenerative joint disease)   . GERD (gastroesophageal reflux disease)   . Headache   . Insomnia   . Mitral valve prolapse 2003   Followed by Dr. Clayborn Bigness (prn)  . Urine frequency    Past Surgical History:  Procedure Laterality Date  . BREAST BIOPSY  2001   Calcification - bx done by Dr. Raylene Everts  . BREAST SURGERY  2001   biopsy  . COLONOSCOPY WITH PROPOFOL N/A 11/13/2016   Procedure: COLONOSCOPY WITH PROPOFOL;  Surgeon: Manya Silvas, MD;  Location: [facility] ENDOSCOPY;  Service: Endoscopy;  Laterality: N/A;  . JOINT REPLACEMENT  2008   left and right knee replaced  . KNEE ARTHROPLASTY  2007   Right Knee  . KNEE ARTHROPLASTY  2008   Left Knee  . left total knee replacement    . right total knee    . TUBAL LIGATION     Social History    Social History  . Marital status: Widowed    Spouse name: N/A  . Number of children: N/A  . Years of education: N/A   Occupational History  . retired    Social History Main Topics  . Smoking status: Never Smoker  . Smokeless tobacco: Never Used  . Alcohol use No  . Drug use: No  . Sexual activity: Not Currently   Other Topics Concern  . Not on file   Social History Narrative   Patient is a 61 y/o widow (2011) who lives independently at home. 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 She has one son, a daughter-in-law and a grandson that live locally.  She struggles with her relationships with mother and brother.   Family History  Problem Relation Age of Onset  . Heart disease Father   . COPD Father   . Cancer Father        throat  . COPD Mother   . Heart disease Mother   . Stroke Mother   . Healthy Brother   . Healthy Son   . Drug abuse Brother    Current Outpatient Prescriptions on File Prior to Visit  Medication Sig  . metoprolol tartrate (LOPRESSOR) 25 MG tablet  Take 25 mg by mouth 2 (two) times daily.  Marland Kitchen omeprazole (PRILOSEC) 20 MG capsule Take 20 mg by mouth daily.  Marland Kitchen oxybutynin (DITROPAN-XL) 10 MG 24 hr tablet Take 1 tablet (10 mg total) by mouth at bedtime.  . sertraline (ZOLOFT) 50 MG tablet Take 1 tablet (50 mg total) by mouth daily.  Marland Kitchen topiramate (TOPAMAX) 50 MG tablet Take 1 tablet (50 mg total) by mouth 2 (two) times daily. Start one tablet daily x 1 week then twice daily (Patient not taking: Reported on 03/13/2017)   No current facility-administered medications on file prior to visit.     Review of Systems Per HPI unless specifically indicated above     Objective:    BP 131/87 (BP Location: Right Arm, Patient Position: Sitting, Cuff Size: Large)   Pulse 72   Temp (!) 97.5 F (36.4 C) (Oral)   Ht '5\' 1"'  (1.549 m)   Wt 243 lb 12.8 oz (110.6 kg)   SpO2 98%   BMI 46.07 kg/m   Wt Readings from Last 3 Encounters:   06/05/17 243 lb 12.8 oz (110.6 kg)  04/05/17 246 lb (111.6 kg)  03/13/17 252 lb (114.3 kg)    Physical Exam  General - obese, well-appearing, NAD HEENT - Normocephalic, atraumatic, PERRL, EOMI, patent nares w/o congestion, oropharynx clear, MMM Neck - supple, non-tender, no LAD, no thyromegaly, no carotid bruit Heart - RRR, no murmurs heard Lungs - Clear throughout all lobes, no wheezing, crackles, or rhonchi. Normal work of breathing. Breast - Normal exam w/ symmetric breasts, no mass, no nipple discharge, no skin changes or tenderness.  Fibrocystic densities noted. Abdomen - soft, NTND, no masses, no hepatosplenomegaly, active bowel sounds GU - Normal external female genitalia without lesions or fusion. Vaginal canal without lesions, atrophy noted.  Normal appearing cervix without lesions or friability. Physiologic discharge on exam. Bimanual exam without adnexal masses, enlarged uterus, or cervical motion tenderness. Extremeties - non-tender, no edema, cap refill < 2 seconds, peripheral pulses intact +2 bilaterally Skin - warm, dry, no rashes Neuro - awake, alert, oriented x3, CN II-X intact, intact muscle strength 5/5 bilaterally, intact distal sensation to light touch, normal coordination, normal gait Psych - Normal mood and affect, normal behavior    Results for orders placed or performed in visit on 05/31/17  HgB A1c  Result Value Ref Range   Hgb A1c MFr Bld 5.7 (H) <5.7 %   Mean Plasma Glucose 117 mg/dL  CBC with Differential/Platelet  Result Value Ref Range   WBC 6.6 3.8 - 10.8 K/uL   RBC 4.53 3.80 - 5.10 MIL/uL   Hemoglobin 13.5 11.7 - 15.5 g/dL   HCT 41.2 35.0 - 45.0 %   MCV 90.9 80.0 - 100.0 fL   MCH 29.8 27.0 - 33.0 pg   MCHC 32.8 32.0 - 36.0 g/dL   RDW 14.6 11.0 - 15.0 %   Platelets 280 140 - 400 K/uL   MPV 10.4 7.5 - 12.5 fL   Neutro Abs 4,158 1,500 - 7,800 cells/uL   Lymphs Abs 1,650 850 - 3,900 cells/uL   Monocytes Absolute 528 200 - 950 cells/uL    Eosinophils Absolute 198 15 - 500 cells/uL   Basophils Absolute 66 0 - 200 cells/uL   Neutrophils Relative % 63 %   Lymphocytes Relative 25 %   Monocytes Relative 8 %   Eosinophils Relative 3 %   Basophils Relative 1 %   Smear Review Criteria for review not met   Comprehensive metabolic  panel  Result Value Ref Range   Sodium 141 135 - 146 mmol/L   Potassium 3.8 3.5 - 5.3 mmol/L   Chloride 105 98 - 110 mmol/L   CO2 22 20 - 32 mmol/L   Glucose, Bld 96 65 - 99 mg/dL   BUN 12 7 - 25 mg/dL   Creat 0.82 0.50 - 0.99 mg/dL   Total Bilirubin 0.4 0.2 - 1.2 mg/dL   Alkaline Phosphatase 82 33 - 130 U/L   AST 14 10 - 35 U/L   ALT 11 6 - 29 U/L   Total Protein 6.0 (L) 6.1 - 8.1 g/dL   Albumin 3.9 3.6 - 5.1 g/dL   Calcium 9.2 8.6 - 10.4 mg/dL  TSH  Result Value Ref Range   TSH 4.86 (H) mIU/L  Lipid panel  Result Value Ref Range   Cholesterol 238 (H) <200 mg/dL   Triglycerides 156 (H) <150 mg/dL   HDL 56 >50 mg/dL   Total CHOL/HDL Ratio 4.3 <5.0 Ratio   VLDL 31 (H) <30 mg/dL   LDL Cholesterol 151 (H) <100 mg/dL      Assessment & Plan:   Problem List Items Addressed This Visit    None    Visit Diagnoses    Encounter for annual physical exam    -  Primary Physical exam with no new findings.  Well adult with no acute concerns.  Reviewed labs w/ pt and answered any questions. Pt taking ambien 10 mg hs prn despite previous discussions about only taking 5 mg. Does not get response w/ 5 mg.  Pt reports no adverse events.  Plan: 1. Obtain health maintenance screenings. 2. Refilled ambien for 10 mg once at hs.  Discussed sleep hygiene and creating nighttime schedule.  Readdress ambien and reduce dose at next visit. 3. Return 1 year for annual physical.   Breast cancer screening     Pt last mammogram 1 year ago.  Pt on annual screenings.  Result very dense breasts requiring additional imaging each year for last 2 years.  Imaging center recommended 3D mammo this year.  Plan: 1. Screening  mammogram order placed for 3D mammo.  Pt will call to schedule appointment.  Information given.  External preferred, so paper order provided.   Relevant Orders   MS DIGITAL SCREENING BILATERAL   Osteoporosis screening     Asymptomatic postmenopausal estrogen deficiency  Pt postmenopausal w/ history of prior DEXA scan > 5 years ago w/ [facility].  That result showed no osteopenia per pt.  Plan: 1. Obtain DG bone density.      Relevant Orders   DG Bone Density   Cervical cancer screening     Pt requiring repeat PAP for cervical cancer screening.  Last pap 3 years ago was normal.  No HPV testing at that time.  Pt declines HPV testing today.  Plan: 1. PAP today.   Relevant Orders   PAP, ThinPrep, Imaging, Medicare      Meds ordered this encounter  Medications  . busPIRone (BUSPAR) 5 MG tablet    Sig: Take 2 tablets (10 mg total) by mouth 2 (two) times daily.    Dispense:  60 tablet    Refill:  2    Order Specific Question:   Supervising Provider    Answer:   Olin Hauser [[id number]]  . zolpidem (AMBIEN) 10 MG tablet    Sig: Take 1 tablet (10 mg total) by mouth at bedtime as needed. for sleep  Dispense:  30 tablet    Refill:  5    Order Specific Question:   Supervising Provider    Answer:   Olin Hauser [[id number]]      Follow up plan: Return in about 1 year (around 06/05/2018) for annual physical and in 2-3 months for depression.  [name], DNP, AGPCNP-BC Adult Gerontology Primary Care Nurse Practitioner [facility] 06/05/2017, 1:11 PM

## 2017-06-05 NOTE — Progress Notes (Signed)
I have reviewed this encounter including the documentation in this note and/or discussed this patient with the provider, [name], AGPCNP-BC. I am certifying that I agree with the content of this note as supervising physician.  [name], [facility] 06/05/2017, 2:55 PM

## 2017-06-05 NOTE — Telephone Encounter (Signed)
The pt called requesting lab results. Please advise.

## 2017-06-05 NOTE — Patient Instructions (Addendum)
Mairin, Thank you for coming in to clinic today.  1. Continue your current medications.  - I have changed your ambien prescription to 10 mg tablets for your next refill.  DO NOT take two. - Take 10 mg buspirone twice daily.  2. Your bone density scan (DEXA) order has been placed.  Call the Scheduling phone number at [phone] to schedule your DEXA at your convenience.  You can choose to go to either location listed below.  Let the scheduler know which location you prefer.  [facility] [address] [place], [place] [address] [place], [place] [address]   3. Call [facility] and take your order with your for your mammogram.  Please have them fax the results to [phone]   Please schedule a follow-up appointment with [name], AGNP. Return in about 1 year (around 06/05/2018) for annual physical and in 2-3 months for depression.  If you have any other questions or concerns, please feel free to call the clinic or send a message through [place]. You may also schedule an earlier appointment if necessary.  You will receive a survey after today's visit either digitally by e-mail or paper by [employer]. Your experiences and feedback matter to [place].  Please respond so we know how we are doing as we provide care for you.   [name], DNP, AGNP-BC Adult Gerontology Nurse Practitioner [facility]

## 2017-06-06 ENCOUNTER — Telehealth: Payer: Self-pay | Admitting: Nurse Practitioner

## 2017-06-06 LAB — PAP, THIN PREP, IMAGING, MEDICARE

## 2017-06-06 MED ORDER — LEVOTHYROXINE SODIUM 50 MCG PO TABS: 50.0000 ug | ORAL_TABLET | Freq: Every day | ORAL | 3 refills | Status: DC

## 2017-06-06 NOTE — Telephone Encounter (Signed)
Verbal provided to pharmacy for 15 mg tablet.  Dose continues to be 10 mg twice daily.    Pt education requested w/ pickup. To take 2/3 tablet (scored in three 5mg  sections)

## 2017-06-06 NOTE — Telephone Encounter (Signed)
Sean at [facility] is requesting a change of medication.  Buspirone is on back order.  His call back number is [phone]

## 2017-06-06 NOTE — Addendum Note (Signed)
Addended by: Cleaster Corin on: 06/06/2017 02:11 PM   Modules accepted: Orders

## 2017-06-06 NOTE — Telephone Encounter (Signed)
Reviewed labs for cholesterol: hold on statin for now.  Recheck 6 mos - 1 year.  Start statin if pt requests after hip surgery.  Reviewed TSH: start levothyroxine 50 mcg once daily  Reviewed dosing and proper administration.  Pt needs dermatology referral.

## 2017-07-03 ENCOUNTER — Other Ambulatory Visit: Payer: Self-pay

## 2017-07-03 MED ORDER — BUSPIRONE HCL 10 MG PO TABS: 10.0000 mg | ORAL_TABLET | Freq: Two times a day (BID) | ORAL | 1 refills | Status: DC

## 2017-07-03 NOTE — Telephone Encounter (Signed)
Pharmacy requesting 90 day refill

## 2017-07-05 ENCOUNTER — Other Ambulatory Visit: Payer: Self-pay

## 2017-07-06 ENCOUNTER — Other Ambulatory Visit: Payer: Self-pay | Admitting: Nurse Practitioner

## 2017-08-07 ENCOUNTER — Encounter
Admission: RE | Admit: 2017-08-07 | Discharge: 2017-08-07 | Disposition: A | Discharge status: Home / Self Care | Payer: BLUE CROSS/BLUE SHIELD | Source: Ambulatory Visit | Attending: Orthopedic Surgery | Admitting: Orthopedic Surgery

## 2017-08-07 DIAGNOSIS — Z01818 Encounter for other preprocedural examination: Secondary | ICD-10-CM | POA: Diagnosis not present

## 2017-08-07 DIAGNOSIS — I10 Essential (primary) hypertension: Secondary | ICD-10-CM | POA: Insufficient documentation

## 2017-08-07 DIAGNOSIS — R9431 Abnormal electrocardiogram [ECG] [EKG]: Secondary | ICD-10-CM | POA: Diagnosis not present

## 2017-08-07 HISTORY — DX: Other complications of anesthesia, initial encounter: T88.59XA

## 2017-08-07 HISTORY — DX: Other specified postprocedural states: Z98.890

## 2017-08-07 HISTORY — DX: Depression, unspecified: F32.A

## 2017-08-07 HISTORY — DX: Hypothyroidism, unspecified: E03.9

## 2017-08-07 HISTORY — DX: Major depressive disorder, single episode, unspecified: F32.9

## 2017-08-07 HISTORY — DX: Essential (primary) hypertension: I10

## 2017-08-07 HISTORY — DX: Adverse effect of unspecified anesthetic, initial encounter: T41.45XA

## 2017-08-07 HISTORY — DX: Nausea with vomiting, unspecified: R11.2

## 2017-08-07 LAB — URINALYSIS, ROUTINE W REFLEX MICROSCOPIC
Bacteria, UA: NONE SEEN
Bilirubin Urine: NEGATIVE
GLUCOSE, UA: NEGATIVE mg/dL
HGB URINE DIPSTICK: NEGATIVE
Ketones, ur: NEGATIVE mg/dL
NITRITE: NEGATIVE
PH: 5 (ref 5.0–8.0)
Protein, ur: NEGATIVE mg/dL
Specific Gravity, Urine: 1.023 (ref 1.005–1.030)

## 2017-08-07 LAB — APTT: APTT: 27 s (ref 24–36)

## 2017-08-07 LAB — CBC
HCT: 40.6 % (ref 35.0–47.0)
HEMOGLOBIN: 13.5 g/dL (ref 12.0–16.0)
MCH: 29.5 pg (ref 26.0–34.0)
MCHC: 33.2 g/dL (ref 32.0–36.0)
MCV: 88.7 fL (ref 80.0–100.0)
PLATELETS: 244 10*3/uL (ref 150–440)
RBC: 4.58 MIL/uL (ref 3.80–5.20)
RDW: 13.6 % (ref 11.5–14.5)
WBC: 6.6 10*3/uL (ref 3.6–11.0)

## 2017-08-07 LAB — SURGICAL PCR SCREEN
MRSA, PCR: NEGATIVE
STAPHYLOCOCCUS AUREUS: NEGATIVE

## 2017-08-07 LAB — SEDIMENTATION RATE: Sed Rate: 23 mm/hr (ref 0–30)

## 2017-08-07 LAB — COMPREHENSIVE METABOLIC PANEL
ALBUMIN: 3.8 g/dL (ref 3.5–5.0)
ALK PHOS: 76 U/L (ref 38–126)
ALT: 12 U/L — AB (ref 14–54)
AST: 21 U/L (ref 15–41)
Anion gap: 9 (ref 5–15)
BUN: 12 mg/dL (ref 6–20)
CALCIUM: 9.1 mg/dL (ref 8.9–10.3)
CHLORIDE: 106 mmol/L (ref 101–111)
CO2: 24 mmol/L (ref 22–32)
CREATININE: 0.76 mg/dL (ref 0.44–1.00)
GFR calc non Af Amer: >60 mL/min (ref >60)
GLUCOSE: 122 mg/dL — AB (ref 65–99)
Potassium: 3.6 mmol/L (ref 3.5–5.1)
SODIUM: 139 mmol/L (ref 135–145)
Total Bilirubin: 0.6 mg/dL (ref 0.3–1.2)
Total Protein: 6.9 g/dL (ref 6.5–8.1)

## 2017-08-07 LAB — PROTIME-INR
INR: 1
Prothrombin Time: 13.1 seconds (ref 11.4–15.2)

## 2017-08-07 LAB — C-REACTIVE PROTEIN: CRP: <0.8 mg/dL (ref <1.0)

## 2017-08-07 NOTE — Pre-Procedure Instructions (Signed)
T+S not drawn today, it would be expired by date of surgery.  Pt has agreed to come back on Monday Oct. 29, 2018 to draw T&S.

## 2017-08-07 NOTE — Pre-Procedure Instructions (Signed)
AS INSTRUCTED BY DR Assunta Gambles, EKG SB 37 CALLED AND FAXED TO DR CALLWOOD, LAST SAW PT 5/18. SPOKE WITH BETSY AND ALSO CALLED AND FAXED TO TIFFANY AT DR Clydell Hakim

## 2017-08-07 NOTE — Patient Instructions (Signed)
Your procedure is scheduled on: Wednesday Nov. 7, 2018. Report to Same Day Surgery. To find out your arrival time please call [phone] between 1PM - 3PM on Tuesday Nov. 6, 2018.  Remember: Instructions that are not followed completely may result in serious medical risk, up to and including death, or upon the discretion of your surgeon and anesthesiologist your surgery may need to be rescheduled.     _X__ 1. Do not eat food after midnight the night before your procedure.                 No gum chewing or hard candies. You may drink clear liquids up to 2 hours                 before you are scheduled to arrive for your surgery- DO not drink clear                 liquids within 2 hours of the start of your surgery.                 Clear Liquids include:  water, apple juice without pulp, clear carbohydrate                 drink such as Clearfast of Gartorade, Black Coffee or Tea (Do not add                 anything to coffee or tea).     ___ 2.  No Alcohol for 24 hours before or after surgery.   ___ 3.  Do Not Smoke or use e-cigarettes For 24 Hours Prior to Your Surgery.                 Do not use any chewable tobacco products for at least 6 hours prior to                 surgery.  ____  4.  Bring all medications with you on the day of surgery if instructed.   __x__  5.  Notify your doctor if there is any change in your medical condition      (cold, fever, infections).     Do not wear jewelry, make-up, hairpins, clips or nail polish. Do not wear lotions, powders, or perfumes. You may wear deodorant. Do not shave 48 hours prior to surgery. Men may shave face and neck. Do not bring valuables to the hospital.    [facility] is not responsible for any belongings or valuables.  Contacts, dentures or bridgework may not be worn into surgery. Leave your suitcase in the car. After surgery it may be brought to your room. For patients admitted to the hospital,  discharge time is determined by your treatment team.   Patients discharged the day of surgery will not be allowed to drive home.   Please read over the following fact sheets that you were given:   Preparing for Surgery.   _x___ Take these medicines the morning of surgery with A SIP OF WATER:    1. busPIRone (BUSPAR)  2. levothyroxine (SYNTHROID, LEVOTHROID)  3. metoprolol tartrate (LOPRESSOR)  4. omeprazole (PRILOSEC OTC)  5. sertraline (ZOLOFT)     ____ Fleet Enema (as directed)   _x___ Use CHG Soap as directed  ____ Use inhalers on the day of surgery  ____ Stop metformin 2 days prior to surgery    ____ Take 1/2 of usual insulin dose the night before surgery. No insulin the morning  of surgery.   ____ Stop Coumadin/Plavix/aspirin on does not apply.  _x___ Stop Anti-inflammatories on Advil, Aleve, Motrin, Ibuprofen, Naproxen, Naprosyn, Goodies Powders or Aspirin  Products. Ok to take Tylenol.  ____ Stop supplements until after surgery.    ____ Bring C-Pap to the hospital.

## 2017-08-08 LAB — URINE CULTURE: SPECIAL REQUESTS: NORMAL

## 2017-08-09 NOTE — Pre-Procedure Instructions (Signed)
FAXED TO DR HOOTEN/ [facility]

## 2017-08-13 ENCOUNTER — Encounter
Admission: RE | Admit: 2017-08-13 | Discharge: 2017-08-13 | Disposition: A | Discharge status: Home / Self Care | Payer: BLUE CROSS/BLUE SHIELD | Source: Ambulatory Visit | Attending: Orthopedic Surgery | Admitting: Orthopedic Surgery

## 2017-08-13 DIAGNOSIS — Z01818 Encounter for other preprocedural examination: Secondary | ICD-10-CM | POA: Insufficient documentation

## 2017-08-13 LAB — TYPE AND SCREEN
ABO/RH(D): A POS
Antibody Screen: NEGATIVE

## 2017-08-16 NOTE — Pre-Procedure Instructions (Signed)
[place] RISK 08/15/17

## 2017-08-21 MED ORDER — CEFAZOLIN SODIUM-DEXTROSE 2-4 GM/100ML-% IV SOLN
2.0000 g | INTRAVENOUS | Status: AC
  Administered 2017-08-22: 2 g via INTRAVENOUS

## 2017-08-21 MED ORDER — TRANEXAMIC ACID 1000 MG/10ML IV SOLN
1000.0000 mg | INTRAVENOUS | Status: AC
  Administered 2017-08-22: 1000 mg via INTRAVENOUS
  Filled 2017-08-21: qty 10

## 2017-08-22 ENCOUNTER — Inpatient Hospital Stay: Payer: BLUE CROSS/BLUE SHIELD | Admitting: Anesthesiology

## 2017-08-22 ENCOUNTER — Other Ambulatory Visit: Payer: Self-pay

## 2017-08-22 ENCOUNTER — Inpatient Hospital Stay: Payer: BLUE CROSS/BLUE SHIELD

## 2017-08-22 ENCOUNTER — Inpatient Hospital Stay
Admission: RE | Admit: 2017-08-22 | Discharge: 2017-08-24 | DRG: 470 | Disposition: A | Discharge status: Skilled Nursing Facility | Payer: BLUE CROSS/BLUE SHIELD | Source: Ambulatory Visit | Attending: Orthopedic Surgery | Admitting: Orthopedic Surgery

## 2017-08-22 ENCOUNTER — Encounter
Admission: RE | Disposition: A | Discharge status: Skilled Nursing Facility | Payer: Self-pay | Source: Ambulatory Visit | Attending: Orthopedic Surgery

## 2017-08-22 ENCOUNTER — Encounter: Payer: Self-pay | Admitting: Orthopedic Surgery

## 2017-08-22 DIAGNOSIS — G4700 Insomnia, unspecified: Secondary | ICD-10-CM | POA: Diagnosis present

## 2017-08-22 DIAGNOSIS — I341 Nonrheumatic mitral (valve) prolapse: Secondary | ICD-10-CM | POA: Diagnosis present

## 2017-08-22 DIAGNOSIS — F418 Other specified anxiety disorders: Secondary | ICD-10-CM | POA: Diagnosis present

## 2017-08-22 DIAGNOSIS — Z79899 Other long term (current) drug therapy: Secondary | ICD-10-CM

## 2017-08-22 DIAGNOSIS — Z79890 Hormone replacement therapy (postmenopausal): Secondary | ICD-10-CM | POA: Diagnosis not present

## 2017-08-22 DIAGNOSIS — Z6841 Body Mass Index (BMI) 40.0 and over, adult: Secondary | ICD-10-CM

## 2017-08-22 DIAGNOSIS — I10 Essential (primary) hypertension: Secondary | ICD-10-CM | POA: Diagnosis present

## 2017-08-22 DIAGNOSIS — Z96653 Presence of artificial knee joint, bilateral: Secondary | ICD-10-CM | POA: Diagnosis present

## 2017-08-22 DIAGNOSIS — Z96649 Presence of unspecified artificial hip joint: Secondary | ICD-10-CM

## 2017-08-22 DIAGNOSIS — M1611 Unilateral primary osteoarthritis, right hip: Secondary | ICD-10-CM | POA: Diagnosis present

## 2017-08-22 DIAGNOSIS — K219 Gastro-esophageal reflux disease without esophagitis: Secondary | ICD-10-CM | POA: Diagnosis present

## 2017-08-22 DIAGNOSIS — E039 Hypothyroidism, unspecified: Secondary | ICD-10-CM | POA: Diagnosis present

## 2017-08-22 HISTORY — PX: TOTAL HIP ARTHROPLASTY: SHX124

## 2017-08-22 LAB — ABO/RH: ABO/RH(D): A POS

## 2017-08-22 SURGERY — ARTHROPLASTY, HIP, TOTAL,POSTERIOR APPROACH
Anesthesia: Spinal | Site: Hip | Laterality: Right | Wound class: Clean

## 2017-08-22 MED ORDER — FENTANYL CITRATE (PF) 100 MCG/2ML IJ SOLN
INTRAMUSCULAR | Status: AC
  Filled 2017-08-22: qty 2

## 2017-08-22 MED ORDER — MAGNESIUM HYDROXIDE 400 MG/5ML PO SUSP
30.0000 mL | Freq: Every day | ORAL | Status: DC | PRN
  Administered 2017-08-24: 30 mL via ORAL
  Filled 2017-08-22: qty 30

## 2017-08-22 MED ORDER — BUPIVACAINE HCL (PF) 0.5 % IJ SOLN
INTRAMUSCULAR | Status: DC | PRN
  Administered 2017-08-22: 2 mL

## 2017-08-22 MED ORDER — SENNOSIDES-DOCUSATE SODIUM 8.6-50 MG PO TABS
1.0000 | ORAL_TABLET | Freq: Two times a day (BID) | ORAL | Status: DC
  Administered 2017-08-22 – 2017-08-24 (×4): 1 via ORAL
  Filled 2017-08-22 (×5): qty 1

## 2017-08-22 MED ORDER — NEOMYCIN-POLYMYXIN B GU 40-200000 IR SOLN
Status: AC
  Filled 2017-08-22: qty 20

## 2017-08-22 MED ORDER — GLYCOPYRROLATE 0.2 MG/ML IJ SOLN
INTRAMUSCULAR | Status: AC
  Filled 2017-08-22: qty 1

## 2017-08-22 MED ORDER — LACTATED RINGERS IV SOLN
INTRAVENOUS | Status: DC | PRN
  Administered 2017-08-22 (×2): via INTRAVENOUS

## 2017-08-22 MED ORDER — MORPHINE SULFATE (PF) 2 MG/ML IV SOLN: 2.0000 mg | INTRAVENOUS | Status: DC | PRN

## 2017-08-22 MED ORDER — DEXAMETHASONE SODIUM PHOSPHATE 10 MG/ML IJ SOLN
INTRAMUSCULAR | Status: AC
  Filled 2017-08-22: qty 1

## 2017-08-22 MED ORDER — ONDANSETRON HCL 4 MG/2ML IJ SOLN
4.0000 mg | Freq: Four times a day (QID) | INTRAMUSCULAR | Status: DC | PRN
  Administered 2017-08-22: 4 mg via INTRAVENOUS
  Filled 2017-08-22: qty 2

## 2017-08-22 MED ORDER — OXYBUTYNIN CHLORIDE ER 10 MG PO TB24
10.0000 mg | ORAL_TABLET | Freq: Every day | ORAL | Status: DC
  Administered 2017-08-22 – 2017-08-23 (×2): 10 mg via ORAL
  Filled 2017-08-22 (×3): qty 1

## 2017-08-22 MED ORDER — METOPROLOL TARTRATE 25 MG PO TABS
25.0000 mg | ORAL_TABLET | Freq: Two times a day (BID) | ORAL | Status: DC
  Administered 2017-08-23 – 2017-08-24 (×2): 25 mg via ORAL
  Filled 2017-08-22 (×3): qty 1

## 2017-08-22 MED ORDER — FENTANYL CITRATE (PF) 100 MCG/2ML IJ SOLN
25.0000 ug | INTRAMUSCULAR | Status: DC | PRN
  Administered 2017-08-22 (×4): 25 ug via INTRAVENOUS

## 2017-08-22 MED ORDER — ACETAMINOPHEN 650 MG RE SUPP: 650.0000 mg | RECTAL | Status: DC | PRN

## 2017-08-22 MED ORDER — LIDOCAINE HCL (CARDIAC) 20 MG/ML IV SOLN
INTRAVENOUS | Status: DC | PRN
  Administered 2017-08-22: 100 mg via INTRAVENOUS

## 2017-08-22 MED ORDER — MEPERIDINE HCL 50 MG/ML IJ SOLN: 6.2500 mg | INTRAMUSCULAR | Status: DC | PRN

## 2017-08-22 MED ORDER — SODIUM CHLORIDE 0.9 % IV SOLN
INTRAVENOUS | Status: DC | PRN
  Administered 2017-08-22: 30 ug/min via INTRAVENOUS

## 2017-08-22 MED ORDER — DIPHENHYDRAMINE HCL 12.5 MG/5ML PO ELIX: 12.5000 mg | ORAL_SOLUTION | ORAL | Status: DC | PRN

## 2017-08-22 MED ORDER — DEXTROSE 5 % IV SOLN
2.0000 g | Freq: Four times a day (QID) | INTRAVENOUS | Status: AC
  Administered 2017-08-22 – 2017-08-23 (×4): 2 g via INTRAVENOUS
  Filled 2017-08-22 (×6): qty 20

## 2017-08-22 MED ORDER — PROPOFOL 500 MG/50ML IV EMUL
INTRAVENOUS | Status: DC | PRN
  Administered 2017-08-22: 70 ug/kg/min via INTRAVENOUS

## 2017-08-22 MED ORDER — MENTHOL 3 MG MT LOZG
1.0000 | LOZENGE | OROMUCOSAL | Status: DC | PRN
  Administered 2017-08-22: 3 mg via ORAL
  Filled 2017-08-22: qty 9

## 2017-08-22 MED ORDER — FLEET ENEMA 7-19 GM/118ML RE ENEM: 1.0000 | ENEMA | Freq: Once | RECTAL | Status: DC | PRN

## 2017-08-22 MED ORDER — PROPOFOL 500 MG/50ML IV EMUL
INTRAVENOUS | Status: AC
  Filled 2017-08-22: qty 50

## 2017-08-22 MED ORDER — LACTATED RINGERS IV SOLN
INTRAVENOUS | Status: DC
  Administered 2017-08-22: 06:00:00 via INTRAVENOUS

## 2017-08-22 MED ORDER — PROPOFOL 10 MG/ML IV BOLUS
INTRAVENOUS | Status: DC | PRN
  Administered 2017-08-22: 30 mg via INTRAVENOUS
  Administered 2017-08-22 (×2): 20 mg via INTRAVENOUS

## 2017-08-22 MED ORDER — PROMETHAZINE HCL 25 MG/ML IJ SOLN: 6.2500 mg | INTRAMUSCULAR | Status: DC | PRN

## 2017-08-22 MED ORDER — GLYCOPYRROLATE 0.2 MG/ML IJ SOLN
INTRAMUSCULAR | Status: DC | PRN
  Administered 2017-08-22: 0.1 mg via INTRAVENOUS
  Administered 2017-08-22: 0.2 mg via INTRAVENOUS

## 2017-08-22 MED ORDER — FERROUS SULFATE 325 (65 FE) MG PO TABS
325.0000 mg | ORAL_TABLET | Freq: Two times a day (BID) | ORAL | Status: DC
  Administered 2017-08-22 – 2017-08-24 (×4): 325 mg via ORAL
  Filled 2017-08-22 (×4): qty 1

## 2017-08-22 MED ORDER — PROPOFOL 10 MG/ML IV BOLUS
INTRAVENOUS | Status: AC
  Filled 2017-08-22: qty 20

## 2017-08-22 MED ORDER — OXYCODONE HCL 5 MG/5ML PO SOLN: 5.0000 mg | Freq: Once | ORAL | Status: DC | PRN

## 2017-08-22 MED ORDER — PHENYLEPHRINE HCL 10 MG/ML IJ SOLN
INTRAMUSCULAR | Status: AC
  Filled 2017-08-22: qty 1

## 2017-08-22 MED ORDER — ACETAMINOPHEN 10 MG/ML IV SOLN
1000.0000 mg | Freq: Four times a day (QID) | INTRAVENOUS | Status: AC
  Administered 2017-08-22 – 2017-08-23 (×4): 1000 mg via INTRAVENOUS
  Filled 2017-08-22 (×4): qty 100

## 2017-08-22 MED ORDER — SODIUM CHLORIDE 0.9 % IV SOLN
INTRAVENOUS | Status: DC
  Administered 2017-08-22: 13:00:00 via INTRAVENOUS

## 2017-08-22 MED ORDER — CEFAZOLIN SODIUM-DEXTROSE 2-4 GM/100ML-% IV SOLN
INTRAVENOUS | Status: AC
Start: 2017-08-22 — End: 2017-08-22
  Filled 2017-08-22: qty 100

## 2017-08-22 MED ORDER — DEXAMETHASONE SODIUM PHOSPHATE 4 MG/ML IJ SOLN
INTRAMUSCULAR | Status: DC | PRN
  Administered 2017-08-22: 5 mg via INTRAVENOUS

## 2017-08-22 MED ORDER — ONDANSETRON HCL 4 MG PO TABS: 4.0000 mg | ORAL_TABLET | Freq: Four times a day (QID) | ORAL | Status: DC | PRN

## 2017-08-22 MED ORDER — SERTRALINE HCL 50 MG PO TABS
50.0000 mg | ORAL_TABLET | Freq: Every day | ORAL | Status: DC
  Administered 2017-08-23 – 2017-08-24 (×2): 50 mg via ORAL
  Filled 2017-08-22 (×2): qty 1

## 2017-08-22 MED ORDER — NEOMYCIN-POLYMYXIN B GU 40-200000 IR SOLN
Status: DC | PRN
  Administered 2017-08-22: 14 mL

## 2017-08-22 MED ORDER — CHLORHEXIDINE GLUCONATE 4 % EX LIQD: 60.0000 mL | Freq: Once | CUTANEOUS | Status: DC

## 2017-08-22 MED ORDER — ZOLPIDEM TARTRATE 5 MG PO TABS
5.0000 mg | ORAL_TABLET | Freq: Every evening | ORAL | Status: DC | PRN
  Administered 2017-08-22 – 2017-08-23 (×2): 5 mg via ORAL
  Filled 2017-08-22 (×2): qty 1

## 2017-08-22 MED ORDER — ENOXAPARIN SODIUM 40 MG/0.4ML ~~LOC~~ SOLN
40.0000 mg | Freq: Two times a day (BID) | SUBCUTANEOUS | Status: DC
  Administered 2017-08-23 – 2017-08-24 (×3): 40 mg via SUBCUTANEOUS
  Filled 2017-08-22 (×3): qty 0.4

## 2017-08-22 MED ORDER — TRAMADOL HCL 50 MG PO TABS: 50.0000 mg | ORAL_TABLET | ORAL | Status: DC | PRN

## 2017-08-22 MED ORDER — SODIUM CHLORIDE 0.9 % IV SOLN
1000.0000 mg | Freq: Once | INTRAVENOUS | Status: AC
  Administered 2017-08-22: 1000 mg via INTRAVENOUS
  Filled 2017-08-22: qty 10

## 2017-08-22 MED ORDER — OXYCODONE HCL 5 MG PO TABS
5.0000 mg | ORAL_TABLET | ORAL | Status: DC | PRN
  Administered 2017-08-22 – 2017-08-24 (×5): 5 mg via ORAL
  Filled 2017-08-22 (×5): qty 1

## 2017-08-22 MED ORDER — OXYCODONE HCL 5 MG PO TABS: 5.0000 mg | ORAL_TABLET | Freq: Once | ORAL | Status: DC | PRN

## 2017-08-22 MED ORDER — FENTANYL CITRATE (PF) 100 MCG/2ML IJ SOLN
INTRAMUSCULAR | Status: DC | PRN
  Administered 2017-08-22: 50 ug via INTRAVENOUS

## 2017-08-22 MED ORDER — PHENOL 1.4 % MT LIQD: 1.0000 | OROMUCOSAL | Status: DC | PRN

## 2017-08-22 MED ORDER — LEVOTHYROXINE SODIUM 50 MCG PO TABS
50.0000 ug | ORAL_TABLET | Freq: Every day | ORAL | Status: DC
  Administered 2017-08-23: 50 ug via ORAL
  Filled 2017-08-22: qty 1

## 2017-08-22 MED ORDER — ACETAMINOPHEN 10 MG/ML IV SOLN
INTRAVENOUS | Status: AC
  Filled 2017-08-22: qty 100

## 2017-08-22 MED ORDER — ACETAMINOPHEN 325 MG PO TABS: 650.0000 mg | ORAL_TABLET | ORAL | Status: DC | PRN

## 2017-08-22 MED ORDER — FENTANYL CITRATE (PF) 100 MCG/2ML IJ SOLN
INTRAMUSCULAR | Status: AC
  Administered 2017-08-22: 25 ug via INTRAVENOUS
  Filled 2017-08-22: qty 2

## 2017-08-22 MED ORDER — MIDAZOLAM HCL 2 MG/2ML IJ SOLN
INTRAMUSCULAR | Status: AC
  Filled 2017-08-22: qty 2

## 2017-08-22 MED ORDER — METOCLOPRAMIDE HCL 10 MG PO TABS
10.0000 mg | ORAL_TABLET | Freq: Three times a day (TID) | ORAL | Status: AC
  Administered 2017-08-22 – 2017-08-24 (×8): 10 mg via ORAL
  Filled 2017-08-22 (×7): qty 1

## 2017-08-22 MED ORDER — ACETAMINOPHEN 10 MG/ML IV SOLN
INTRAVENOUS | Status: DC | PRN
  Administered 2017-08-22: 1000 mg via INTRAVENOUS

## 2017-08-22 MED ORDER — BISACODYL 10 MG RE SUPP
10.0000 mg | Freq: Every day | RECTAL | Status: DC | PRN
  Administered 2017-08-24: 10 mg via RECTAL
  Filled 2017-08-22: qty 1

## 2017-08-22 MED ORDER — MIDAZOLAM HCL 5 MG/5ML IJ SOLN
INTRAMUSCULAR | Status: DC | PRN
  Administered 2017-08-22: 2 mg via INTRAVENOUS

## 2017-08-22 MED ORDER — LIDOCAINE HCL (PF) 2 % IJ SOLN
INTRAMUSCULAR | Status: AC
  Filled 2017-08-22: qty 10

## 2017-08-22 MED ORDER — PHENYLEPHRINE HCL 10 MG/ML IJ SOLN
INTRAMUSCULAR | Status: DC | PRN
  Administered 2017-08-22: 200 ug via INTRAVENOUS
  Administered 2017-08-22 (×3): 100 ug via INTRAVENOUS

## 2017-08-22 MED ORDER — TETRACAINE HCL 1 % IJ SOLN
INTRAMUSCULAR | Status: DC | PRN
  Administered 2017-08-22: 10 mg via INTRASPINAL

## 2017-08-22 MED ORDER — ALUM & MAG HYDROXIDE-SIMETH 200-200-20 MG/5ML PO SUSP: 30.0000 mL | ORAL | Status: DC | PRN

## 2017-08-22 MED ORDER — PANTOPRAZOLE SODIUM 40 MG PO TBEC
40.0000 mg | DELAYED_RELEASE_TABLET | Freq: Two times a day (BID) | ORAL | Status: DC
  Administered 2017-08-22 – 2017-08-24 (×4): 40 mg via ORAL
  Filled 2017-08-22 (×5): qty 1

## 2017-08-22 MED ORDER — BUSPIRONE HCL 10 MG PO TABS
10.0000 mg | ORAL_TABLET | Freq: Two times a day (BID) | ORAL | Status: DC
  Administered 2017-08-22 – 2017-08-24 (×4): 10 mg via ORAL
  Filled 2017-08-22 (×4): qty 1

## 2017-08-22 MED ORDER — OXYCODONE HCL 5 MG PO TABS
10.0000 mg | ORAL_TABLET | ORAL | Status: DC | PRN
  Administered 2017-08-23 (×4): 10 mg via ORAL
  Filled 2017-08-22 (×4): qty 2

## 2017-08-22 SURGICAL SUPPLY — 59 items
BLADE DRUM FLTD (BLADE) ×2 IMPLANT
BLADE SAW 1 (BLADE) ×2 IMPLANT
CANISTER SUCT 1200ML W/VALVE (MISCELLANEOUS) ×2 IMPLANT
CANISTER SUCT 3000ML PPV (MISCELLANEOUS) ×4 IMPLANT
CAPT HIP TOTAL 2 ×2 IMPLANT
CARTRIDGE OIL MAESTRO DRILL (MISCELLANEOUS) ×1 IMPLANT
CATH FOL LEG HOLDER (MISCELLANEOUS) ×2 IMPLANT
CATH TRAY METER 16FR LF (MISCELLANEOUS) ×2 IMPLANT
COVER LIGHT HANDLE STERIS (MISCELLANEOUS) ×2 IMPLANT
DIFFUSER MAESTRO (MISCELLANEOUS) ×2 IMPLANT
DRAPE INCISE IOBAN 66X45 STRL (DRAPES) ×2 IMPLANT
DRAPE INCISE IOBAN 66X60 STRL (DRAPES) ×2 IMPLANT
DRAPE SHEET LG 3/4 BI-LAMINATE (DRAPES) ×2 IMPLANT
DRSG DERMACEA 8X12 NADH (GAUZE/BANDAGES/DRESSINGS) ×2 IMPLANT
DRSG OPSITE POSTOP 4X12 (GAUZE/BANDAGES/DRESSINGS) ×2 IMPLANT
DRSG OPSITE POSTOP 4X14 (GAUZE/BANDAGES/DRESSINGS) IMPLANT
DRSG TEGADERM 4X4.75 (GAUZE/BANDAGES/DRESSINGS) ×2 IMPLANT
DURAPREP 26ML APPLICATOR (WOUND CARE) ×6 IMPLANT
ELECT BLADE 6.5 EXT (BLADE) ×2 IMPLANT
ELECT CAUTERY BLADE 6.4 (BLADE) ×2 IMPLANT
EVACUATOR 1/8 PVC DRAIN (DRAIN) ×2 IMPLANT
GLOVE BIOGEL M STRL SZ7.5 (GLOVE) ×4 IMPLANT
GLOVE BIOGEL PI IND STRL 7.0 (GLOVE) ×4 IMPLANT
GLOVE BIOGEL PI IND STRL 9 (GLOVE) ×1 IMPLANT
GLOVE BIOGEL PI INDICATOR 7.0 (GLOVE) ×4
GLOVE BIOGEL PI INDICATOR 9 (GLOVE) ×1
GLOVE BIOGEL PI ORTHO PRO 7.5 (GLOVE) ×4
GLOVE INDICATOR 8.0 STRL GRN (GLOVE) ×2 IMPLANT
GLOVE PI ORTHO PRO STRL 7.5 (GLOVE) ×4 IMPLANT
GLOVE SURG SYN 9.0  PF PI (GLOVE) ×1
GLOVE SURG SYN 9.0 PF PI (GLOVE) ×1 IMPLANT
GOWN STRL REUS W/ TWL LRG LVL3 (GOWN DISPOSABLE) ×3 IMPLANT
GOWN STRL REUS W/TWL 2XL LVL3 (GOWN DISPOSABLE) ×2 IMPLANT
GOWN STRL REUS W/TWL LRG LVL3 (GOWN DISPOSABLE) ×3
HANDLE YANKAUER SUCT BULB TIP (MISCELLANEOUS) ×2 IMPLANT
HOOD PEEL AWAY FLYTE STAYCOOL (MISCELLANEOUS) ×4 IMPLANT
KIT RM TURNOVER STRD PROC AR (KITS) ×2 IMPLANT
NDL SAFETY 18GX1.5 (NEEDLE) ×2 IMPLANT
NS IRRIG 500ML POUR BTL (IV SOLUTION) ×2 IMPLANT
OIL CARTRIDGE MAESTRO DRILL (MISCELLANEOUS) ×2
PACK HIP PROSTHESIS (MISCELLANEOUS) ×2 IMPLANT
PIN STEIN THRED 5/32 (Pin) ×2 IMPLANT
PULSAVAC PLUS IRRIG FAN TIP (DISPOSABLE) ×2
SOL .9 NS 3000ML IRR  AL (IV SOLUTION) ×1
SOL .9 NS 3000ML IRR UROMATIC (IV SOLUTION) ×1 IMPLANT
SOL PREP PVP 2OZ (MISCELLANEOUS) ×2
SOLUTION PREP PVP 2OZ (MISCELLANEOUS) ×1 IMPLANT
SPONGE DRAIN TRACH 4X4 STRL 2S (GAUZE/BANDAGES/DRESSINGS) ×2 IMPLANT
STAPLER SKIN PROX 35W (STAPLE) ×2 IMPLANT
SUT ETHIBOND #5 BRAIDED 30INL (SUTURE) ×2 IMPLANT
SUT VIC AB 0 CT1 36 (SUTURE) ×4 IMPLANT
SUT VIC AB 1 CT1 36 (SUTURE) ×4 IMPLANT
SUT VIC AB 2-0 CT1 27 (SUTURE) ×1
SUT VIC AB 2-0 CT1 TAPERPNT 27 (SUTURE) ×1 IMPLANT
SYR 20CC LL (SYRINGE) ×2 IMPLANT
TAPE ADH 3 LX (MISCELLANEOUS) ×2 IMPLANT
TAPE TRANSPORE STRL 2 31045 (GAUZE/BANDAGES/DRESSINGS) ×2 IMPLANT
TIP FAN IRRIG PULSAVAC PLUS (DISPOSABLE) ×1 IMPLANT
TOWEL OR 17X26 4PK STRL BLUE (TOWEL DISPOSABLE) ×2 IMPLANT

## 2017-08-22 NOTE — Progress Notes (Signed)
Medications with Automatic Dosage Limits  Changed Zolpidem dose from 10mg  to 5mg  based on [facility] policy, which states:   Zolpidem (Ambien):  For female patients and all patients age 61 years or older, dosage automatically limited to 5 mg.  Olivia Canter, [facility] 08/22/2017

## 2017-08-22 NOTE — Discharge Instructions (Signed)
Instructions after Total Hip Replacement ° ° °  Donise Woodle P. Tynisha Ogan, Jr., M.D.    ° Dept. of Orthopaedics & Sports Medicine ° [facility] ° [address] ° [place], [place]  [address] ° Phone: [phone]   Fax: [phone] ° °  °DIET: °• Drink plenty of non-alcoholic fluids. °• Resume your normal diet. Include foods high in fiber. ° °ACTIVITY:  °• You may use crutches or a walker with weight-bearing as tolerated, unless instructed otherwise. °• You may be weaned off of the walker or crutches by your Physical Therapist.  °• Do NOT reach below the level of your knees or cross your legs until allowed.    °• Continue doing gentle exercises. Exercising will reduce the pain and swelling, increase motion, and prevent muscle weakness.   °• Please continue to use the TED compression stockings for 6 weeks. You may remove the stockings at night, but should reapply them in the morning. °• Do not drive or operate any equipment until instructed. ° °WOUND CARE:  °• Continue to use ice packs periodically to reduce pain and swelling. °• Keep the incision clean and dry. °• You may bathe or shower after the staples are removed at the first office visit following surgery. ° °MEDICATIONS: °• You may resume your regular medications. °• Please take the pain medication as prescribed on the medication. °• Do not take pain medication on an empty stomach. °• You have been given a prescription for a blood thinner to prevent blood clots. Please take the medication as instructed. (NOTE: After completing a 2 week course of Lovenox, take one Enteric-coated aspirin once a day.) °• Pain medications and iron supplements can cause constipation. Use a stool softener (Senokot or Colace) on a daily basis and a laxative (dulcolax or miralax) as needed. °• Do not drive or drink alcoholic beverages when taking pain medications. ° °CALL THE OFFICE FOR: °• Temperature above 101 degrees °• Excessive bleeding or drainage on the dressing. °• Excessive  swelling, coldness, or paleness of the toes. °• Persistent nausea and vomiting. ° °FOLLOW-UP:  °• You should have an appointment to return to the office in 6 weeks after surgery. °• Arrangements have been made for continuation of Physical Therapy (either home therapy or outpatient therapy). °  °

## 2017-08-22 NOTE — Transfer of Care (Signed)
Immediate Anesthesia Transfer of Care Note  Patient: Denise Glass  Procedure(s) Performed: TOTAL HIP ARTHROPLASTY (Right Hip)  Patient Location: PACU  Anesthesia Type:Spinal  Level of Consciousness: drowsy and patient cooperative  Airway & Oxygen Therapy: Patient Spontanous Breathing and Patient connected to nasal cannula oxygen  Post-op Assessment: Report given to RN and Post -op Vital signs reviewed and stable  Post vital signs: Reviewed and stable  Last Vitals:  Vitals:   08/22/17 0606  BP: 137/73  Pulse: (!) 56  Resp: 12  Temp: (!) 36.4 C  SpO2: 99%    Last Pain:  Vitals:   08/22/17 0606  TempSrc: Tympanic  PainSc: 3          Complications: No apparent anesthesia complications

## 2017-08-22 NOTE — Op Note (Signed)
OPERATIVE NOTE  DATE OF SURGERY:  08/22/2017  PATIENT NAME:  Denise Glass   DOB: [birth year]  MRN: [id number]  PRE-OPERATIVE DIAGNOSIS: Degenerative arthrosis of the right hip, primary  POST-OPERATIVE DIAGNOSIS:  Same  PROCEDURE:  Right total hip arthroplasty  SURGEON:  Marciano Sequin. M.D.  ASSISTANT:  Vance Peper, PA (present and scrubbed throughout the case, critical for assistance with exposure, retraction, instrumentation, and closure)  ANESTHESIA: spinal  ESTIMATED BLOOD LOSS: 150 mL  FLUIDS REPLACED: 1650 mL of crystalloid  DRAINS: 2 medium drains to a Hemovac reservoir  IMPLANTS UTILIZED: DePuy 10.5 mm small stature AML femoral stem, 50 mm OD Pinnacle 100 acetabular component, +4 mm neutral Pinnacle Marathon polyethylene insert, and a 32 mm CoCr +1 mm hip ball  INDICATIONS FOR SURGERY: Denise Glass is a 61 y.o. year old female with a long history of progressive hip and groin  pain. X-rays demonstrated severe degenerative changes. The patient had not seen any significant improvement despite conservative nonsurgical intervention. After discussion of the risks and benefits of surgical intervention, the patient expressed understanding of the risks benefits and agree with plans for total hip arthroplasty.   The risks, benefits, and alternatives were discussed at length including but not limited to the risks of infection, bleeding, nerve injury, stiffness, blood clots, the need for revision surgery, limb length inequality, dislocation, cardiopulmonary complications, among others, and they were willing to proceed.  PROCEDURE IN DETAIL: The patient was brought into the operating room and, after adequate spinal anesthesia was achieved, the patient was placed in a left lateral decubitus position. Axillary roll was placed and all bony prominences were well-padded. The patient's right hip was cleaned and prepped with alcohol and DuraPrep and draped in the usual sterile fashion. A  "timeout" was performed as per usual protocol. A lateral curvilinear incision was made gently curving towards the posterior superior iliac spine. The IT band was incised in line with the skin incision and the fibers of the gluteus maximus were split in line. The piriformis tendon was identified, skeletonized, and incised at its insertion to the proximal femur and reflected posteriorly. A T type posterior capsulotomy was performed. Prior to dislocation of the femoral head, a threaded Steinmann pin was inserted through a separate stab incision into the pelvis superior to the acetabulum and bent in the form of a stylus so as to assess limb length and hip offset throughout the procedure. The femoral head was then dislocated posteriorly. Inspection of the femoral head demonstrated severe degenerative changes with full-thickness loss of articular cartilage. The femoral neck cut was performed using an oscillating saw. The anterior capsule was elevated off of the femoral neck using a periosteal elevator. Attention was then directed to the acetabulum. The remnant of the labrum was excised using electrocautery. Inspection of the acetabulum also demonstrated significant degenerative changes. The acetabulum was reamed in sequential fashion up to a 49 mm diameter. Good punctate bleeding bone was encountered. A 50 mm Pinnacle 100 acetabular component was positioned and impacted into place. Good scratch fit was appreciated. A +4 mm neutral polyethylene trial was inserted.  Attention was then directed to the proximal femur. A hole for reaming of the proximal femoral canal was created using a high-speed burr. The femoral canal was reamed in sequential fashion up to a 10 mm diameter. This allowed for approximately 7-1/2 cm of scratch fit.  It was thus elected to ream up to a 10.5 mm diameter to allow for a line to  line fit.  Serial broaches were inserted up to a 10.5 mm small stature femoral broach. Calcar region was planed and a  trial reduction was performed using a 32 mm hip ball with a +1 mm neck length. Good equalization of limb lengths and hip offset was appreciated and excellent stability was noted both anteriorly and posteriorly. Trial components were removed. The acetabular shell was irrigated with copious amounts of normal saline with antibiotic solution and suctioned dry. A +4 mm neutral Pinnacle Marathon polyethylene insert was positioned and impacted into place. Next, a 10.5 mm small stature AML femoral stem was positioned and impacted into place. Excellent scratch fit was appreciated. A trial reduction was again performed with a 32 mm hip ball with a +1 mm neck length. Again, good equalization of limb lengths was appreciated and excellent stability appreciated both anteriorly and posteriorly. The hip was then dislocated and the trial hip ball was removed. The Morse taper was cleaned and dried. A 32 mm cobalt chrome hip ball with a +1 mm neck length was placed on the trunnion and impacted into place. The hip was then reduced and placed through range of motion. Excellent stability was appreciated both anteriorly and posteriorly.  The wound was irrigated with copious amounts of normal saline with antibiotic solution and suctioned dry. Good hemostasis was appreciated. The posterior capsulotomy was repaired using #5 Ethibond. Piriformis tendon was reapproximated to the undersurface of the gluteus medius tendon using #5 Ethibond. Two medium drains were placed in the wound bed and brought out through separate stab incisions to be attached to a Hemovac reservoir. The IT band was reapproximated using interrupted sutures of #1 Vicryl. Subcutaneous tissue was approximated using first #0 Vicryl followed by #2-0 Vicryl. The skin was closed with skin staples.  The patient tolerated the procedure well and was transported to the recovery room in stable condition.   Marciano Sequin., M.D.

## 2017-08-22 NOTE — Progress Notes (Signed)
PT Cancellation Note  Patient Details Name: Denise Glass MRN: [id number] DOB: 13-Jul-1956   Cancelled Treatment:    Reason Eval/Treat Not Completed: Patient not medically ready. Chart reviewed. Attempted to see patient however she has not recovered full motor/sensory function. She is unable to resist dorsiflexion of R ankle and reports continued RLE numbness. Will attempt PT evaluation on next date. RN to dangle. RN notified.   Lyndel Safe Jhordan Mckibben PT, DPT   Zarai Orsborn 08/22/2017, 3:55 PM

## 2017-08-22 NOTE — Progress Notes (Signed)
Anticoagulation monitoring(Lovenox):  61yo  female ordered Lovenox 30 mg Q12h  Filed Weights   08/22/17 0606  Weight: 240 lb (108.9 kg)   BMI 44.7   Lab Results  Component Value Date   CREATININE 0.76 08/07/2017   CREATININE 0.82 05/31/2017   CREATININE 0.86 01/17/2015   Estimated Creatinine Clearance: 85.1 mL/min (by C-G formula based on SCr of 0.76 mg/dL). Hemoglobin & Hematocrit     Component Value Date/Time   HGB 13.5 08/07/2017 0947   HGB 14.2 01/17/2015 1431   HCT 40.6 08/07/2017 0947   HCT 41.9 01/17/2015 1431     Per Protocol for Patient with estCrcl > 30 ml/min and BMI > 40, will transition to Lovenox 40 mg Q12h.     Denise Glass, [facility] 08/22/2017

## 2017-08-22 NOTE — H&P (Signed)
The patient has been re-examined, and the chart reviewed, and there have been no interval changes to the documented history and physical.    The risks, benefits, and alternatives have been discussed at length. The patient expressed understanding of the risks benefits and agreed with plans for surgical intervention.  Denise Glass P. Treyten Monestime, Jr. M.D.    

## 2017-08-22 NOTE — Anesthesia Post-op Follow-up Note (Signed)
Anesthesia QCDR form completed.        

## 2017-08-22 NOTE — Anesthesia Preprocedure Evaluation (Signed)
Anesthesia Evaluation  Patient identified by MRN, date of birth, ID band Patient awake    Reviewed: Allergy & Precautions, NPO status , Patient's Chart, lab work & pertinent test results  History of Anesthesia Complications (+) PONV and history of anesthetic complications  Airway Mallampati: II  TM Distance: >3 FB Neck ROM: Full    Dental no notable dental hx.    Pulmonary neg pulmonary ROS, neg sleep apnea, neg COPD,    breath sounds clear to auscultation- rhonchi (-) wheezing      Cardiovascular hypertension, Pt. on medications (-) CAD, (-) Past MI and (-) Cardiac Stents  Rhythm:Regular Rate:Normal - Systolic murmurs and - Diastolic murmurs    Neuro/Psych  Headaches, PSYCHIATRIC DISORDERS Anxiety Depression    GI/Hepatic Neg liver ROS, GERD  ,  Endo/Other  neg diabetesHypothyroidism   Renal/GU negative Renal ROS     Musculoskeletal  (+) Arthritis ,   Abdominal (+) + obese,   Peds  Hematology negative hematology ROS (+)   Anesthesia Other Findings Past Medical History: No date: Complication of anesthesia     Comment:  pt reports her appetite takes 2-3 weeks to come back               after anesthesia. No date: Depression No date: DJD (degenerative joint disease) No date: GERD (gastroesophageal reflux disease) 2016: Headache     Comment:  cluster head aches, Head aches have subsided. No date: Hypertension No date: Hypothyroidism No date: Insomnia 2003: Mitral valve prolapse     Comment:  Followed by Dr. Clayborn Bigness (prn) 2002: PONV (postoperative nausea and vomiting)     Comment:  With BTL No date: Urine frequency   Reproductive/Obstetrics                             Lab Results  Component Value Date   WBC 6.6 08/07/2017   HGB 13.5 08/07/2017   HCT 40.6 08/07/2017   MCV 88.7 08/07/2017   PLT 244 08/07/2017    Anesthesia Physical Anesthesia Plan  ASA: III  Anesthesia  Plan: Spinal   Post-op Pain Management:    Induction:   PONV Risk Score and Plan: 3 and Dexamethasone, Ondansetron and Propofol infusion  Airway Management Planned: Natural Airway  Additional Equipment:   Intra-op Plan:   Post-operative Plan:   Informed Consent: I have reviewed the patients History and Physical, chart, labs and discussed the procedure including the risks, benefits and alternatives for the proposed anesthesia with the patient or authorized representative who has indicated his/her understanding and acceptance.   Dental advisory given  Plan Discussed with: CRNA and Anesthesiologist  Anesthesia Plan Comments:         Anesthesia Quick Evaluation

## 2017-08-22 NOTE — NC FL2 (Signed)
[place] LEVEL OF CARE SCREENING TOOL     IDENTIFICATION  Patient Name: Denise Glass Birthdate: August 29, 1956 Sex: female Admission Date (Current Location): 08/22/2017  [place] and [place] Number:  Engineering geologist and Address:  [facility], [address] [place], [place] [address]      Provider Number: [id number]  Attending Physician Name and Address:  Dereck Leep, MD  Relative Name and Phone Number:       Current Level of Care: Hospital Recommended Level of Care: [place] Prior Approval Number:    Date Approved/Denied:   PASRR Number: [id number])  Discharge Plan: SNF    Current Diagnoses: Patient Active Problem List   Diagnosis Date Noted  . Status post total replacement of hip 08/22/2017  . Anxiety 03/13/2017  . Moderate episode of recurrent major depressive disorder ([place]) 03/13/2017  . Headache syndrome 03/24/2016  . Trochanteric bursitis of right hip 01/04/2016  . Arthritis of foot 11/28/2015  . Trigger middle finger of left hand 07/20/2015  . SOB (shortness of breath) on exertion 02/01/2015  . Postmenopausal atrophic vaginitis 06/02/2014  . Sleep disturbance 04/06/2014  . GERD (gastroesophageal reflux disease) 09/23/2012  . Insomnia 09/23/2012    Orientation RESPIRATION BLADDER Height & Weight     Self, Time, Situation, Place  Normal Continent Weight: 240 lb (108.9 kg) Height:  5' 1.5" (156.2 cm)  BEHAVIORAL SYMPTOMS/MOOD NEUROLOGICAL BOWEL NUTRITION STATUS      Continent Diet(Diet: Clear Liquid to be Advanced. )  AMBULATORY STATUS COMMUNICATION OF NEEDS Skin   Extensive Assist Verbally Surgical wounds(Incision: Right Hip. )                       Personal Care Assistance Level of Assistance  Bathing, Feeding, Dressing Bathing Assistance: Limited assistance Feeding assistance: Independent Dressing Assistance: Limited assistance     Functional Limitations Info  Sight, Hearing,  Speech Sight Info: Adequate Hearing Info: Adequate Speech Info: Adequate    SPECIAL CARE FACTORS FREQUENCY  PT (By licensed PT), OT (By licensed OT)     PT Frequency: (5) OT Frequency: (5)            Contractures      Additional Factors Info  Code Status, Allergies Code Status Info: (Full Code. ) Allergies Info: (Sulfa Antibiotics)           Current Medications (08/22/2017):  This is the current hospital active medication list Current Facility-Administered Medications  Medication Dose Route Frequency Provider Last Rate Last Dose  . 0.9 %  sodium chloride infusion   Intravenous Continuous Hooten, Laurice Record, MD 100 mL/hr at 08/22/17 1238    . acetaminophen (OFIRMEV) IV 1,000 mg  1,000 mg Intravenous Q6H Hooten, Laurice Record, MD      . acetaminophen (TYLENOL) tablet 650 mg  650 mg Oral Q4H PRN Hooten, Laurice Record, MD       Or  . acetaminophen (TYLENOL) suppository 650 mg  650 mg Rectal Q4H PRN Hooten, Laurice Record, MD      . alum & mag hydroxide-simeth (MAALOX/MYLANTA) 200-200-20 MG/5ML suspension 30 mL  30 mL Oral Q4H PRN Hooten, Laurice Record, MD      . bisacodyl (DULCOLAX) suppository 10 mg  10 mg Rectal Daily PRN Hooten, Laurice Record, MD      . busPIRone (BUSPAR) tablet 10 mg  10 mg Oral BID Hooten, Laurice Record, MD      . ceFAZolin (ANCEF) 2 g in dextrose  5 % 100 mL IVPB  2 g Intravenous Q6H Hooten, Laurice Record, MD      . diphenhydrAMINE (BENADRYL) 12.5 MG/5ML elixir 12.5-25 mg  12.5-25 mg Oral Q4H PRN Hooten, Laurice Record, MD      . Derrill Memo ON 08/23/2017] enoxaparin (LOVENOX) injection 40 mg  40 mg Subcutaneous Q12H Hooten, Laurice Record, MD      . ferrous sulfate tablet 325 mg  325 mg Oral BID WC Hooten, Laurice Record, MD      . Derrill Memo ON 08/23/2017] levothyroxine (SYNTHROID, LEVOTHROID) tablet 50 mcg  50 mcg Oral Daily Hooten, Laurice Record, MD      . magnesium hydroxide (MILK OF MAGNESIA) suspension 30 mL  30 mL Oral Daily PRN Hooten, Laurice Record, MD      . menthol-cetylpyridinium (CEPACOL) lozenge 3 mg  1 lozenge Oral PRN Hooten,  Laurice Record, MD       Or  . phenol (CHLORASEPTIC) mouth spray 1 spray  1 spray Mouth/Throat PRN Hooten, Laurice Record, MD      . metoCLOPramide (REGLAN) tablet 10 mg  10 mg Oral TID AC & HS Hooten, Laurice Record, MD      . metoprolol tartrate (LOPRESSOR) tablet 25 mg  25 mg Oral BID Hooten, Laurice Record, MD      . morphine 2 MG/ML injection 2 mg  2 mg Intravenous Q2H PRN Hooten, Laurice Record, MD      . ondansetron (ZOFRAN) tablet 4 mg  4 mg Oral Q6H PRN Hooten, Laurice Record, MD       Or  . ondansetron (ZOFRAN) injection 4 mg  4 mg Intravenous Q6H PRN Hooten, Laurice Record, MD      . oxybutynin (DITROPAN-XL) 24 hr tablet 10 mg  10 mg Oral QHS Hooten, Laurice Record, MD      . oxyCODONE (Oxy IR/ROXICODONE) immediate release tablet 10 mg  10 mg Oral Q3H PRN Hooten, Laurice Record, MD      . oxyCODONE (Oxy IR/ROXICODONE) immediate release tablet 5 mg  5 mg Oral Q3H PRN Hooten, Laurice Record, MD   5 mg at 08/22/17 1359  . pantoprazole (PROTONIX) EC tablet 40 mg  40 mg Oral BID Hooten, Laurice Record, MD      . senna-docusate (Senokot-S) tablet 1 tablet  1 tablet Oral BID Hooten, Laurice Record, MD      . Derrill Memo ON 08/23/2017] sertraline (ZOLOFT) tablet 50 mg  50 mg Oral Daily Hooten, Laurice Record, MD      . sodium phosphate (FLEET) 7-19 GM/118ML enema 1 enema  1 enema Rectal Once PRN Hooten, Laurice Record, MD      . traMADol Veatrice Bourbon) tablet 50-100 mg  50-100 mg Oral Q4H PRN Hooten, Laurice Record, MD      . zolpidem (AMBIEN) tablet 5 mg  5 mg Oral QHS PRN Hooten, Laurice Record, MD         Discharge Medications: Please see discharge summary for a list of discharge medications.  Relevant Imaging Results:  Relevant Lab Results:   Additional Information (SSN: [id number])  Charlsie Fleeger, Veronia Beets, LCSW

## 2017-08-22 NOTE — Anesthesia Procedure Notes (Signed)
Spinal  Patient location during procedure: OR Start time: 08/22/2017 7:19 AM End time: 08/22/2017 7:25 AM Staffing Anesthesiologist: Emmie Niemann, MD Resident/CRNA: Bernardo Heater, CRNA Performed: resident/CRNA  Preanesthetic Checklist Completed: patient identified, site marked, surgical consent, pre-op evaluation, timeout performed, IV checked, risks and benefits discussed and monitors and equipment checked Spinal Block Patient position: sitting Prep: ChloraPrep Patient monitoring: heart rate, continuous pulse ox, blood pressure and cardiac monitor Approach: midline Location: L4-5 Injection technique: single-shot Needle Needle type: Introducer and Pencil-Tip  Needle gauge: 24 G Needle length: 9 cm Additional Notes Negative paresthesia. Negative blood return. Positive free-flowing CSF. Expiration date of kit checked and confirmed. Patient tolerated procedure well, without complications.

## 2017-08-23 ENCOUNTER — Encounter: Payer: Self-pay | Admitting: Orthopedic Surgery

## 2017-08-23 LAB — BASIC METABOLIC PANEL
ANION GAP: 8 (ref 5–15)
BUN: 11 mg/dL (ref 6–20)
CALCIUM: 8.5 mg/dL — AB (ref 8.9–10.3)
CO2: 23 mmol/L (ref 22–32)
Chloride: 105 mmol/L (ref 101–111)
Creatinine, Ser: 0.81 mg/dL (ref 0.44–1.00)
GFR calc Af Amer: >60 mL/min (ref >60)
GFR calc non Af Amer: >60 mL/min (ref >60)
GLUCOSE: 149 mg/dL — AB (ref 65–99)
Potassium: 3.5 mmol/L (ref 3.5–5.1)
SODIUM: 136 mmol/L (ref 135–145)

## 2017-08-23 LAB — CBC
HEMATOCRIT: 33.8 % — AB (ref 35.0–47.0)
HEMOGLOBIN: 11.5 g/dL — AB (ref 12.0–16.0)
MCH: 30 pg (ref 26.0–34.0)
MCHC: 34 g/dL (ref 32.0–36.0)
MCV: 88.4 fL (ref 80.0–100.0)
PLATELETS: 231 10*3/uL (ref 150–440)
RBC: 3.83 MIL/uL (ref 3.80–5.20)
RDW: 13.4 % (ref 11.5–14.5)
WBC: 14.7 10*3/uL — ABNORMAL HIGH (ref 3.6–11.0)

## 2017-08-23 MED ORDER — TRAMADOL HCL 50 MG PO TABS: 50.0000 mg | ORAL_TABLET | ORAL | 0 refills | Status: DC | PRN

## 2017-08-23 MED ORDER — OXYCODONE HCL 5 MG PO TABS: 5.0000 mg | ORAL_TABLET | ORAL | 0 refills | Status: DC | PRN

## 2017-08-23 MED ORDER — ENOXAPARIN SODIUM 40 MG/0.4ML ~~LOC~~ SOLN: 40.0000 mg | SUBCUTANEOUS | 0 refills | Status: DC

## 2017-08-23 MED ORDER — ENOXAPARIN SODIUM 40 MG/0.4ML ~~LOC~~ SOLN: 40.0000 mg | SUBCUTANEOUS | Status: DC

## 2017-08-23 NOTE — Anesthesia Postprocedure Evaluation (Addendum)
Anesthesia Post Note  Patient: Denise Glass  Procedure(s) Performed: TOTAL HIP ARTHROPLASTY (Right Hip)  Patient location during evaluation: Nursing Unit Anesthesia Type: Spinal Level of consciousness: patient cooperative, awake and alert and oriented Pain management: pain level controlled Vital Signs Assessment: post-procedure vital signs reviewed and stable Respiratory status: spontaneous breathing, respiratory function stable and nonlabored ventilation Cardiovascular status: blood pressure returned to baseline and stable Postop Assessment: no headache, no backache and no apparent nausea or vomiting Anesthetic complications: no     Last Vitals:  Vitals:   08/23/17 0329 08/23/17 0726  BP: (!) 107/50 (!) 109/51  Pulse: 75 76  Resp: 18   Temp: 37.1 C 36.8 C  SpO2: 95% 98%    Last Pain:  Vitals:   08/23/17 0726  TempSrc: Oral  PainSc:                  [name]

## 2017-08-23 NOTE — Clinical Social Work Note (Addendum)
Clinical Social Work Assessment  Patient Details  Name: Denise Glass MRN: [id number] Date of Birth: 02/04/1956  Date of referral:  08/23/17               Reason for consult:  Facility Placement, Discharge Planning                Permission sought to share information with:  Facility Contact Representative Permission granted to share information::  Yes, Verbal Permission Granted  Name::      Skilled Nursing Facility  Agency::   [place]  Relationship::     Contact Information:     Housing/Transportation Living arrangements for the past 2 months:  Single Family Home Source of Information:  Patient Patient Interpreter Needed:  None Criminal Activity/Legal Involvement Pertinent to Current Situation/Hospitalization:  No - Comment as needed Significant Relationships:  Adult Children Lives with:  Self Do you feel safe going back to the place where you live?  Yes Need for family participation in patient care:  Yes (Comment)  Care giving concerns: Patient lives in alone in [place], [place].   Social Worker assessment / plan: Clinical Social Worker (CSW) received SNF consult. PT is recommending SNF. Social work intern met with patient alone at bedside. Patient was alert and oriented x4. Social work intern introduced self and explained the role of the CSW department. Patient states she lives in [place] by herself and her son Denise Glass ([phone]) is her primary contact. Social work intern explained that PT is recommending SNF and the SNF process. Social work intern also explained that [employer] ([employer]) will have to give authorization for SNF. Patient verbalized her understanding and is agreeable to a SNF search. Patient stated that she prefers [facility] because she is familiar with them. FL2 completed and faxed out. CSW and social work intern will continue to follow up and assist.  Social work intern presented bed offers to patient and she chose [facility]. Denise Glass, [facility] liaison is aware of above  and will start BCBS authorization.    Employment status:  Retired Insurance information:  Managed Care PT Recommendations:  Skilled Nursing Facility Information / Referral to community resources:  Skilled Nursing Facility  Patient/Family's Response to care: Patient is agreeable to SNF and chose [facility].  Patient/Family's Understanding of and Emotional Response to Diagnosis, Current Treatment, and Prognosis: Patient was pleasant and thanked social work intern for her assistance.  Emotional Assessment Appearance:  Appears stated age Attitude/Demeanor/Rapport:    Affect (typically observed):  Pleasant, Accepting, Calm Orientation:  Oriented to Self, Oriented to Place, Oriented to  Time, Oriented to Situation Alcohol / Substance use:  Not Applicable Psych involvement (Current and /or in the community):  No (Comment)  Discharge Needs  Concerns to be addressed:  Care Coordination, Discharge Planning Concerns Readmission within the last 30 days:  No Current discharge risk:  Dependent with Mobility Barriers to Discharge:  Continued Medical Work up   Denise Glass, Student-Social Work 08/23/2017, 12:10 PM  

## 2017-08-23 NOTE — Progress Notes (Signed)
Physical Therapy Treatment Patient Details Name: Denise Glass MRN: [id number] DOB: 02/21/1956 Today's Date: 08/23/2017    History of Present Illness Pt underwent R THR posterior approach, PT and OT started POD#1. PMH includes depression, GERD, HTN, and bilateral TKAs.     PT Comments    Pt agreeable to PT; reports 8 pain in R hip and buttock/thigh with movement. Requires Min A for lower extremities; Mod A for trunk for bed mobility. Dizziness upon sit, but no nausea. Pt able to stand once dizziness resolved with min guard and takes several steps with Min guard to BSC. Requires set up and Min guard for stand for personal hygiene. Pt ambulates further for recliner and receives up in chair. Requires education with each transfer for hand placement to assist with proper technique and adherence to posterior hip precautions with transfer. Pt educated in supine and long sit exercises with performance. Continue PT to progress strength and endurance to improve all functional mobility and reinforce safe practice with adherence to posterior hip precautions.    Follow Up Recommendations  SNF     Equipment Recommendations  Other (comment)(has rw )    Recommendations for Other Services       Precautions / Restrictions Precautions Precautions: Posterior Hip Precaution Comments: pt able to verbalize 3/3 posterior total hip precautions independently Restrictions Weight Bearing Restrictions: Yes RLE Weight Bearing: Weight bearing as tolerated    Mobility  Bed Mobility Overal bed mobility: Needs Assistance Bed Mobility: Supine to Sit     Supine to sit: Min assist;Mod assist     General bed mobility comments: Min for LEs; Mod for trunk  Transfers Overall transfer level: Needs assistance Equipment used: Rolling walker (2 wheeled) Transfers: Sit to/from Stand Sit to Stand: Min guard         General transfer comment: Mild increased time to steady and gain bearings. Educated on proper use  of hands to protect precautions.  Ambulation/Gait Ambulation/Gait assistance: Min guard Ambulation Distance (Feet): (3 feet; post toileting 8 feet) Assistive device: Rolling walker (2 wheeled) Gait Pattern/deviations: Step-to pattern     General Gait Details: Pt need rw that can be lowered further for improved UE wt shift    Stairs            Wheelchair Mobility    Modified Rankin (Stroke Patients Only)       Balance Overall balance assessment: Needs assistance Sitting-balance support: Feet supported;No upper extremity supported Sitting balance-Leahy Scale: Good Sitting balance - Comments: Initially mild dizziness, which decreased after several minutes static sitting   Standing balance support: Bilateral upper extremity supported Standing balance-Leahy Scale: Fair                              Cognition Arousal/Alertness: Awake/alert Behavior During Therapy: WFL for tasks assessed/performed Overall Cognitive Status: Within Functional Limits for tasks assessed                                        Exercises Total Joint Exercises Ankle Circles/Pumps: AROM;Both;20 reps Quad Sets: Strengthening;Both;20 reps Gluteal Sets: Strengthening;Both;20 reps Short Arc Quad: AROM;Strengthening;Both;10 reps;Supine Heel Slides: AAROM;Right;10 reps;Supine(AROM L ) Hip ABduction/ADduction: AAROM;Both;10 reps;Supine Straight Leg Raises: AAROM;Both;10 reps;Supine Other Exercises Other Exercises: Educated in posterior hip precautions as it applies to functional activities    General Comments  Pertinent Vitals/Pain Pain Assessment: 0-10 Pain Score: 8  Pain Location: R hip/thigh/bottom Pain Intervention(s): Monitored during session;Premedicated before session;Repositioned    Home Living Family/patient expects to be discharged to:: Private residence Living Arrangements: Alone Available Help at Discharge: Family;Friend(s);Available  PRN/intermittently Type of Home: House Home Access: Stairs to enter Entrance Stairs-Rails: Left(pole on R) Home Layout: One level Home Equipment: Walker - 2 wheels;Cane - single point;Hand held shower head      Prior Function Level of Independence: Independent      Comments: Pt is an independent community ambulator without assistive device. No falls. Independent with ADLs/IADLs   PT Goals (current goals can now be found in the care plan section) Acute Rehab PT Goals Patient Stated Goal: Return to prior level of function Progress towards PT goals: Progressing toward goals    Frequency    BID      PT Plan Current plan remains appropriate    Co-evaluation              AM-PAC PT "6 Clicks" Daily Activity  Outcome Measure  Difficulty turning over in bed (including adjusting bedclothes, sheets and blankets)?: Unable Difficulty moving from lying on back to sitting on the side of the bed? : Unable Difficulty sitting down on and standing up from a chair with arms (e.g., wheelchair, bedside commode, etc,.)?: A Little Help needed moving to and from a bed to chair (including a wheelchair)?: A Little Help needed walking in hospital room?: A Lot Help needed climbing 3-5 steps with a railing? : A Lot 6 Click Score: 12    End of Session Equipment Utilized During Treatment: Gait belt Activity Tolerance: Patient tolerated treatment well Patient left: in chair;with call bell/phone within reach(refused chair alarm; will call to get up)   PT Visit Diagnosis: Unsteadiness on feet (R26.81);Muscle weakness (generalized) (M62.81);Difficulty in walking, not elsewhere classified (R26.2);Pain Pain - Right/Left: Right Pain - part of body: Hip     Time: 1610-9604 PT Time Calculation (min) (ACUTE ONLY): 50 min  Charges:  $Gait Training: 8-22 mins $Therapeutic Exercise: 8-22 mins $Therapeutic Activity: 8-22 mins                    G CodesLarae Grooms, PTA 08/23/2017,  3:49 PM

## 2017-08-23 NOTE — Progress Notes (Signed)
   Subjective: 1 Day Post-Op Procedure(s) (LRB): TOTAL HIP ARTHROPLASTY (Right) Patient reports pain as moderate.   Patient is well, and has had no acute complaints or problems We will start therapy today.  Plan is to go Home after hospital stay. no nausea and no vomiting Patient denies any chest pains or shortness of breath. Objective: Vital signs in last 24 hours: Temp:  [96.7 F (35.9 C)-98.7 F (37.1 C)] 98.7 F (37.1 C) (11/08 0329) Pulse Rate:  [45-75] 75 (11/08 0329) Resp:  [4-19] 18 (11/08 0329) BP: (105-158)/(49-75) 107/50 (11/08 0329) SpO2:  [93 %-100 %] 95 % (11/08 0329) well approximated incision Heels are non tender and elevated off the bed using rolled towels Intake/Output from previous day: 11/07 0701 - 11/08 0700 In: 5418.3 [P.O.:960; I.V.:3818.3; IV Piggyback:640] Out: 2770 [Urine:2400; Drains:220; Blood:150] Intake/Output this shift: Total I/O In: 1921.7 [I.V.:1481.7; IV Piggyback:440] Out: 1914 [NWGNF:6213; Drains:100]  Recent Labs    08/23/17 0317  HGB 11.5*   Recent Labs    08/23/17 0317  WBC 14.7*  RBC 3.83  HCT 33.8*  PLT 231   Recent Labs    08/23/17 0317  NA 136  K 3.5  CL 105  CO2 23  BUN 11  CREATININE 0.81  GLUCOSE 149*  CALCIUM 8.5*   No results for input(s): LABPT, INR in the last 72 hours.  EXAM General - Patient is Alert, Appropriate and Oriented Extremity - Neurologically intact Neurovascular intact Sensation intact distally Intact pulses distally Dorsiflexion/Plantar flexion intact No cellulitis present Compartment soft Dressing - dressing C/D/I Motor Function - intact, moving foot and toes well on exam.    Past Medical History:  Diagnosis Date  . Complication of anesthesia    pt reports her appetite takes 2-3 weeks to come back after anesthesia.  . Depression   . DJD (degenerative joint disease)   . GERD (gastroesophageal reflux disease)   . Headache 2016   cluster head aches, Head aches have subsided.   . Hypertension   . Hypothyroidism   . Insomnia   . Mitral valve prolapse 2003   Followed by Dr. Clayborn Bigness (prn)  . PONV (postoperative nausea and vomiting) 2002   With BTL  . Urine frequency     Assessment/Plan: 1 Day Post-Op Procedure(s) (LRB): TOTAL HIP ARTHROPLASTY (Right) Active Problems:   Status post total replacement of hip  Estimated body mass index is 44.61 kg/m as calculated from the following:   Height as of this encounter: 5' 1.5" (1.562 m).   Weight as of this encounter: 108.9 kg (240 lb). Advance diet Up with therapy D/C IV fluids Plan for discharge tomorrow Discharge home with home health  Labs: Were reviewed and acceptable DVT Prophylaxis - Lovenox, Foot Pumps and TED hose Weight-Bearing as tolerated to right leg D/C O2 and Pulse OX and try on Room Air Begin working on bowel movement Labs tomorrow morning  Dian Laprade R. [place] 08/23/2017, 6:58 AM

## 2017-08-23 NOTE — Evaluation (Signed)
Occupational Therapy Evaluation Patient Details Name: Denise Glass MRN: [id number] DOB: [birth year] Today's Date: 08/23/2017    History of Present Illness Pt underwent R THR posterior approach, PT and OT started POD#1. PMH includes depression, GERD, HTN, and bilateral TKAs.    Clinical Impression   Pt is 61 year old female s/p R THR (posterior total hip precautions). Pt lives at home by herself. Pt was independent in all ADLs prior to surgery and is eager to return to PLOF.  Pt is currently limited in functional ADLs due to pain, decreased ROM/strength, and posterior total hip precautions.  Pt requires minimal assist for LB dressing and bathing skills due to pain and decreased AROM of RLE due to precautions. Pt educated in [place] for LB dressing in order to maintain precautions, pt verbalized understanding of verbal instruction and visual demo. Pt would benefit from continued skilled OT services for additional education in assistive devices, functional mobility, and education in recommendations for home modifications to increase safety and prevent falls.  Pt is a good candidate for SNF to continue rehabilitation.     Follow Up Recommendations  SNF    Equipment Recommendations  3 in 1 bedside commode;Tub/shower bench    Recommendations for Other Services       Precautions / Restrictions Precautions Precautions: Posterior Hip Precaution Booklet Issued: Yes (comment) Precaution Comments: pt able to verbalize 3/3 posterior total hip precautions independently Restrictions Weight Bearing Restrictions: Yes RLE Weight Bearing: Weight bearing as tolerated      Mobility Bed Mobility Overal bed mobility: Needs Assistance Bed Mobility: Supine to Sit     Supine to sit: Mod assist;HOB elevated     General bed mobility comments: Pt requires modA+1 for supine to sit with LE and trunk management.   Transfers     Balance Overall balance assessment: Needs assistance Sitting-balance support:  No upper extremity supported Sitting balance-Leahy Scale: Good                                 ADL either performed or assessed with clinical judgement   ADL Overall ADL's : Needs assistance/impaired             Lower Body Bathing: Sitting/lateral leans;Minimal assistance Lower Body Bathing Details (indicate cue type and reason): pt educated in DME for seated shower and grab bars, pt verbalized understanding      Lower Body Dressing: Minimal assistance;Sit to/from stand Lower Body Dressing Details (indicate cue type and reason): pt educated in AE for LB dressing in order to maintain precautions, pt verbalized understanding of verbal instruction and visual demo Toilet Transfer: BSC;Stand-pivot;RW;Min guard             General ADL Comments: pt generally min assist for LB ADL tasks due to posterior total hip precautions, would benefit from additional training with AE/DME to maximize safety and functional independence.     Vision Baseline Vision/History: Wears glasses Wears Glasses: At all times Patient Visual Report: No change from baseline       Perception     Praxis      Pertinent Vitals/Pain Pain Assessment: No/denies pain Pain Score: 3  Pain Location: no pain in R hip at rest Pain Descriptors / Indicators: Constant Pain Intervention(s): Monitored during session     Hand Dominance     Extremity/Trunk Assessment Upper Extremity Assessment Upper Extremity Assessment: Overall WFL for tasks assessed   Lower Extremity Assessment Lower Extremity  Assessment: Defer to PT evaluation;RLE deficits/detail RLE Deficits / Details: Able to perform SAQ without assistance. Two finger assist for R SLR   Cervical / Trunk Assessment Cervical / Trunk Assessment: Normal   Communication Communication Communication: No difficulties   Cognition Arousal/Alertness: Awake/alert Behavior During Therapy: WFL for tasks assessed/performed Overall Cognitive Status:  Within Functional Limits for tasks assessed                                     General Comments       Exercises Other Exercises Other Exercises: Pt educated in home/routines modifications to maximize safety and independence with ADL tasks. Pt verbalized understanding.   Shoulder Instructions      Home Living Family/patient expects to be discharged to:: Private residence Living Arrangements: Alone Available Help at Discharge: Family;Friend(s);Available PRN/intermittently Type of Home: House Home Access: Stairs to enter [employer] of Steps: 2 Entrance Stairs-Rails: Left(pole on R) Home Layout: One level     Bathroom Shower/Tub: Teacher, early years/pre: Handicapped height     Home Equipment: Environmental consultant - 2 wheels;Cane - single point;Hand held shower head          Prior Functioning/Environment Level of Independence: Independent        Comments: Pt is an independent community ambulator without assistive device. No falls. Independent with ADLs/IADLs        OT Problem List: Decreased strength;Decreased range of motion;Decreased knowledge of use of DME or AE;Impaired balance (sitting and/or standing);Decreased activity tolerance      OT Treatment/Interventions: Self-care/ADL training;Therapeutic activities;DME and/or AE instruction;Patient/family education;Therapeutic exercise    OT Goals(Current goals can be found in the care plan section) Acute Rehab OT Goals Patient Stated Goal: Return to prior level of function OT Goal Formulation: With patient Time For Goal Achievement: 09/06/17 Potential to Achieve Goals: Good  OT Frequency: Min 2X/week   Barriers to D/C: Decreased caregiver support          Co-evaluation              AM-PAC PT "6 Clicks" Daily Activity     Outcome Measure Help from another person eating meals?: None Help from another person taking care of personal grooming?: A Little Help from another person  toileting, which includes using toliet, bedpan, or urinal?: A Little Help from another person bathing (including washing, rinsing, drying)?: A Little Help from another person to put on and taking off regular upper body clothing?: A Little Help from another person to put on and taking off regular lower body clothing?: A Little 6 Click Score: 19   End of Session    Activity Tolerance: Patient tolerated treatment well Patient left: in bed;with call bell/phone within reach;with bed alarm set  OT Visit Diagnosis: Other abnormalities of gait and mobility (R26.89)                Time: 2423-5361 OT Time Calculation (min): 22 min Charges:  OT General Charges $OT Visit: 1 Visit OT Evaluation $OT Eval Low Complexity: 1 Low OT Treatments $Self Care/Home Management : 8-22 mins G-Codes: OT G-codes **NOT FOR INPATIENT CLASS** Functional Assessment Tool Used: AM-PAC 6 Clicks Daily Activity;Clinical judgement Functional Limitation: Self care Self Care Current Status (W4315): At least 40 percent but less than 60 percent impaired, limited or restricted Self Care Goal Status (Q0086): At least 20 percent but less than 40 percent impaired, limited or restricted  Jeni Salles, MPH, MS, OTR/L ascom [phone] 08/23/17, 3:01 PM

## 2017-08-23 NOTE — Care Management Note (Signed)
Case Management Note  Patient Details  Name: Denise Glass MRN: [id number] Date of Birth: [birth year]  Subjective/Objective:                   Spoke with patient regarding discharge planning. She hopes to be able to return home at discharge. She states she is alone but has family that will provide support and transportation. She has limited space in her home and does not feel that she will need a commode or shower bench. She has a front-wheeled walker available for use at home.  Dr. Marry Guan has requested that [facility] call in patient's Lovenox 40mg  injection daily for 14 days- no refills to patient's pharmacy for price/authorization assessment.  Patient uses [facility] [phone]. Home health agencies provide to patient and patient has selected [facility] based on MD preference.  Action/Plan: [facility]. Referral to [facility]. RNCM will follow.   Expected Discharge Date:                  Expected Discharge Plan:     In-House Referral:     Discharge planning Services  CM Consult  Post Acute Care Choice:  Home Health Choice offered to:  Patient  DME Arranged:    DME Agency:     HH Arranged:  PT [place]:  [facility] (formerly [facility])  Status of Service:  In process, will continue to follow  If discussed at Long Length of Stay Meetings, dates discussed:    Additional Comments:  Marshell Garfinkel, RN 08/23/2017, 8:29 AM

## 2017-08-23 NOTE — Progress Notes (Signed)
Initial Nutrition Assessment  DOCUMENTATION CODES:   Morbid obesity  INTERVENTION:   Recommend Ensure BID to help pt meet estimated protein needs as pt s/p R THR and with morbid obesity  Recommend daily MVI to promote healing   NUTRITION DIAGNOSIS:   Increased nutrient needs related to other (see comment)(s/p R THR) as evidenced by increased estimated needs from protein.  GOAL:   Patient will meet greater than or equal to 90% of their needs  MONITOR:   PO intake, Supplement acceptance, Labs, Weight trends  REASON FOR ASSESSMENT:   Malnutrition Screening Tool    ASSESSMENT:   61 y/o female s/p R THR posterior approach, PT and OT started POD#1. PMH includes depression, GERD, HTN, and bilateral TKAs.    Met with pt in room today. Pt reports good appetite pta and reports eating 30-100% of meals in hospital. Per chart, pt appears to be weight stable. RD discussed with pt the importance of adequate protein intake needed for healing. Pt with increased nutrient needs r/t her morbid obesity and R THR. Pt likes chocolate Ensure and drinks these at home. RD also recommends daily MVI to provide pt with the nutrients needed for healing. Per MD note, pt to discharge to [facility] 11/9.   Medications reviewed and include: lovenox, ferrous sulfate, synthroid, reglan, protonix, senokot, oxycodone   Labs reviewed: Ca 8.5(L) Wbc- 14.7(H)  Nutrition-Focused physical exam completed. Findings are no fat depletion, no muscle depletion, and no edema.   Diet Order:  Diet regular Room service appropriate? Yes; Fluid consistency: Thin Diet - low sodium heart healthy  EDUCATION NEEDS:   Education needs have been addressed  Skin:  Incisions: R Hip  Last BM:  11/6  Height:   Ht Readings from Last 1 Encounters:  08/22/17 5' 1.5" (1.562 m)    Weight:   Wt Readings from Last 1 Encounters:  08/22/17 240 lb (108.9 kg)    Ideal Body Weight:  48.86 kg  BMI:  Body mass index is 44.61  kg/m.  Estimated Nutritional Needs:   Kcal:  1750-2050kcal/day   Protein:  109-120g/day   Fluid:  >1.7L/day   Koleen Distance MS, RD, LDN Pager #[phone] After Hours Pager: [phone]

## 2017-08-23 NOTE — Care Management (Signed)
[facility]ified [facility] that [facility] may need [facility]hab and to please hold this prescription until we know she can return home. Per A[facility]ail Butts at [facility] Lovenox cost 25$ and they will hold medication.

## 2017-08-23 NOTE — Evaluation (Signed)
Physical Therapy Evaluation Patient Details Name: Denise Glass MRN: [id number] DOB: 1955-11-28 Today's Date: 08/23/2017   History of Present Illness  Pt underwent R THR posterior approach and was unable to be evaluated on POD#0 due to effects from spinal. PT evaluation performed POD#1. PMH includes depression, GERD, and HTN.  Clinical Impression  Pt admitted with above diagnosis. Pt currently with functional limitations due to the deficits listed below (see PT Problem List).  Pt requires modA+1 for bed mobility and CGA for transfers. She is able to ambulate with rolling walker around end of bed with therapist. While ambulating toward door pt feels acutely lightheaded and weak. She is quickly returned to bed and starts to dry heave into emesis bag. Vitals obtained and are stable. RN notified. Attempted ambulating back around bed to recliner but pt reports she is unable to perform and needs to lay back down in bed. Will attempt to get up to recliner later this afternoon. At this time recommend SNF placement due to level of assistance required by patient, poor tolerance for ambulation, and limited support at home. Hopefully pt will be able to progress and discharge plan can be updated. Pt will benefit from PT services to address deficits in strength, balance, and mobility in order to return to full function at home.     Follow Up Recommendations SNF    Equipment Recommendations  Other (comment)(TBD at next venue)    Recommendations for Other Services OT consult     Precautions / Restrictions Precautions Precautions: Posterior Hip Precaution Booklet Issued: Yes (comment) Restrictions Weight Bearing Restrictions: Yes RLE Weight Bearing: Weight bearing as tolerated      Mobility  Bed Mobility Overal bed mobility: Needs Assistance Bed Mobility: Supine to Sit     Supine to sit: Mod assist     General bed mobility comments: Pt requires modA+1 for supine to sit with LE and trunk  management. Cues required for sequencing. LE management required when returning to bed. Trendelenberg and overhead trapeze required to scoot up toward [facility]  Transfers Overall transfer level: Needs assistance Equipment used: Rolling walker (2 wheeled) Transfers: Sit to/from Stand Sit to Stand: Min guard         General transfer comment: Pt requires cues for safe hand placement. Able to perform without external asisstance from therapist. Once upright pt is steady with UE support on rolling walker  Ambulation/Gait Ambulation/Gait assistance: Min guard Ambulation Distance (Feet): 10 Feet Assistive device: Rolling walker (2 wheeled) Gait Pattern/deviations: Decreased step length - left;Decreased stance time - right;Decreased weight shift to right Gait velocity: Decreased Gait velocity interpretation: <1.8 ft/sec, indicative of risk for recurrent falls General Gait Details: Pt able to ambulate around end of bed with therapist. While ambulating toward door pt feels acutely lightheaded and weak. She is quickly returned to bed and starts to dry heave into emesis bag. Vitals obtained and are stable. RN notified. Attempted ambulating back around bed to recliner but pt reports she is unable to perform and needs to lay back down in bed. Will attempt to get up to recliner later this afternoon.  Stairs            Wheelchair Mobility    Modified Rankin (Stroke Patients Only)       Balance Overall balance assessment: Needs assistance Sitting-balance support: No upper extremity supported Sitting balance-Leahy Scale: Good     Standing balance support: Bilateral upper extremity supported Standing balance-Leahy Scale: Fair  Pertinent Vitals/Pain Pain Assessment: 0-10 Pain Score: 3  Pain Location: R hip Pain Descriptors / Indicators: Constant Pain Intervention(s): Premedicated before session;Monitored during session    [place] expects to be discharged to:: Private residence Living Arrangements: Alone Available Help at Discharge: Family;Friend(s) Type of Home: House Home Access: Stairs to enter Entrance Stairs-Rails: Left(Pole on right) Entrance Stairs-Number of Steps: 2 Home Layout: One level Home Equipment: Walker - 2 wheels;Cane - single point(no BSC, no shower seat)      Prior Function Level of Independence: Independent         Comments: Pt is an independent community ambulator without assistive device. No falls. Independnet with ADLs/IADLs     Hand Dominance        Extremity/Trunk Assessment   Upper Extremity Assessment Upper Extremity Assessment: Overall WFL for tasks assessed    Lower Extremity Assessment Lower Extremity Assessment: RLE deficits/detail RLE Deficits / Details: Able to perform SAQ without assistance. Two finger assist for R SLR       Communication   Communication: No difficulties  Cognition Arousal/Alertness: Awake/alert Behavior During Therapy: WFL for tasks assessed/performed Overall Cognitive Status: Within Functional Limits for tasks assessed                                        General Comments      Exercises Total Joint Exercises Ankle Circles/Pumps: AROM;Both;10 reps;Supine Quad Sets: Strengthening;Both;10 reps;Supine Gluteal Sets: Strengthening;Both;10 reps;Supine Towel Squeeze: Strengthening;Both;10 reps;Supine Short Arc Quad: Strengthening;Right;10 reps;Supine Heel Slides: Strengthening;Right;10 reps;Supine Hip ABduction/ADduction: Strengthening;Right;10 reps;Supine Straight Leg Raises: Strengthening;Right;10 reps;Supine   Assessment/Plan    PT Assessment Patient needs continued PT services  PT Problem List Decreased strength;Decreased range of motion;Decreased activity tolerance;Decreased balance;Decreased mobility;Obesity;Pain       PT Treatment Interventions DME instruction;Gait training;Stair  training;Functional mobility training;Therapeutic activities;Therapeutic exercise;Balance training;Neuromuscular re-education;Patient/family education;Manual techniques    PT Goals (Current goals can be found in the Care Plan section)  Acute Rehab PT Goals Patient Stated Goal: Return to prior level of function PT Goal Formulation: With patient Time For Goal Achievement: 09/06/17 Potential to Achieve Goals: Good    Frequency BID   Barriers to discharge Decreased caregiver support;Inaccessible home environment Lives alone, 2 steps to enter house    Co-evaluation               AM-PAC PT "6 Clicks" Daily Activity  Outcome Measure Difficulty turning over in bed (including adjusting bedclothes, sheets and blankets)?: Unable Difficulty moving from lying on back to sitting on the side of the bed? : Unable Difficulty sitting down on and standing up from a chair with arms (e.g., wheelchair, bedside commode, etc,.)?: A Lot Help needed moving to and from a bed to chair (including a wheelchair)?: A Little Help needed walking in hospital room?: A Little Help needed climbing 3-5 steps with a railing? : A Lot 6 Click Score: 12    End of Session Equipment Utilized During Treatment: Gait belt Activity Tolerance: Other (comment);Treatment limited secondary to medical complications (Comment)(Limited by nausea and lightheadedness) Patient left: in bed;with call bell/phone within reach;with bed alarm set Nurse Communication: Mobility status PT Visit Diagnosis: Unsteadiness on feet (R26.81);Muscle weakness (generalized) (M62.81);Difficulty in walking, not elsewhere classified (R26.2);Pain Pain - Right/Left: Right Pain - part of body: Hip    Time: 5009-3818 PT Time Calculation (min) (ACUTE ONLY): 36 min   Charges:   PT  Evaluation $PT Eval Low Complexity: 1 Low PT Treatments $Therapeutic Exercise: 8-22 mins   PT G Codes:   PT G-Codes **NOT FOR INPATIENT CLASS** Functional Assessment Tool  Used: AM-PAC 6 Clicks Basic Mobility Functional Limitation: Mobility: Walking and moving around Mobility: Walking and Moving Around Current Status (O2518): At least 60 percent but less than 80 percent impaired, limited or restricted Mobility: Walking and Moving Around Goal Status [phone]): At least 20 percent but less than 40 percent impaired, limited or restricted    Phillips Grout PT, DPT    Azlee Monforte 08/23/2017, 11:57 AM

## 2017-08-23 NOTE — Progress Notes (Signed)
OT Cancellation Note  Patient Details Name: ELNORE COSENS MRN: [id number] DOB: 1956-09-02   Cancelled Treatment:    Reason Eval/Treat Not Completed: Patient at procedure or test/ unavailable(Per PT exiting room, pt. has just received breakfeast, and has not had pain medicine yet. Will continue to monitor, and attempt OT eval at a later time.)  Harrel Carina, MS, OTR/L 08/23/2017, 8:58 AM

## 2017-08-23 NOTE — Discharge Summary (Signed)
Physician Discharge Summary  Patient ID: Denise Glass MRN: [id number] DOB/AGE: June 03, 1956 61 y.o.  Admit date: 08/22/2017 Discharge date: 08/24/2017  Admission Diagnoses:  PRIMARY OSTEOARTHRITIS OF RIGHT HIP   Discharge Diagnoses: Patient Active Problem List   Diagnosis Date Noted  . Status post total replacement of hip 08/22/2017  . Anxiety 03/13/2017  . Moderate episode of recurrent major depressive disorder ([place]) 03/13/2017  . Headache syndrome 03/24/2016  . Trochanteric bursitis of right hip 01/04/2016  . Arthritis of foot 11/28/2015  . Trigger middle finger of left hand 07/20/2015  . SOB (shortness of breath) on exertion 02/01/2015  . Postmenopausal atrophic vaginitis 06/02/2014  . Sleep disturbance 04/06/2014  . GERD (gastroesophageal reflux disease) 09/23/2012  . Insomnia 09/23/2012    Past Medical History:  Diagnosis Date  . Complication of anesthesia    pt reports her appetite takes 2-3 weeks to come back after anesthesia.  . Depression   . DJD (degenerative joint disease)   . GERD (gastroesophageal reflux disease)   . Headache 2016   cluster head aches, Head aches have subsided.  . Hypertension   . Hypothyroidism   . Insomnia   . Mitral valve prolapse 2003   Followed by Dr. Clayborn Bigness (prn)  . PONV (postoperative nausea and vomiting) 2002   With BTL  . Urine frequency      Transfusion: No transfusions during this admission   Consultants (if any):   Discharged Condition: Improved  Hospital Course: Denise Glass is an 61 y.o. female who was admitted 08/22/2017 with a diagnosis of degenerative arthrosis right hip and went to the operating room on 08/22/2017 and underwent the above named procedures.    Surgeries:Procedure(s): TOTAL HIP ARTHROPLASTY on 08/22/2017  PRE-OPERATIVE DIAGNOSIS: Degenerative arthrosis of the right hip, primary  POST-OPERATIVE DIAGNOSIS:  Same  PROCEDURE:  Right total hip arthroplasty  SURGEON:  Marciano Sequin.  M.D.  ASSISTANT:  Vance Peper, PA (present and scrubbed throughout the case, critical for assistance with exposure, retraction, instrumentation, and closure)  ANESTHESIA: spinal  ESTIMATED BLOOD LOSS: 150 mL  FLUIDS REPLACED: 1650 mL of crystalloid  DRAINS: 2 medium drains to a Hemovac reservoir  IMPLANTS UTILIZED: DePuy 10.5 mm small stature AML femoral stem, 50 mm OD Pinnacle 100 acetabular component, +4 mm neutral Pinnacle Marathon polyethylene insert, and a 32 mm CoCr +1 mm hip ball  INDICATIONS FOR SURGERY: Denise Glass is a 61 y.o. year old female with a long history of progressive hip and groin  pain. X-rays demonstrated severe degenerative changes. The patient had not seen any significant improvement despite conservative nonsurgical intervention. After discussion of the risks and benefits of surgical intervention, the patient expressed understanding of the risks benefits and agree with plans for total hip arthroplasty.   The risks, benefits, and alternatives were discussed at length including but not limited to the risks of infection, bleeding, nerve injury, stiffness, blood clots, the need for revision surgery, limb length inequality, dislocation, cardiopulmonary complications, among others, and they were willing to proceed.   Patient tolerated the surgery well. No complications .Patient was taken to PACU where she was stabilized and then transferred to the orthopedic floor.  Patient started on Lovenox 40 mg q 12 hrs. Foot pumps applied bilaterally at 80 mm hgb. Heels elevated off bed with rolled towels. No evidence of DVT. Calves non tender. Negative Homan. Physical therapy started on day #1 for gait training and transfer with OT starting on  day #1 for ADL and assisted  devices. Patient has done well with therapy. Ambulated 10-20 feet upon being discharged.    Patient's IV And Foley were discontinued on day #1 with Hemovac being discontinued on day #2. Dressing was changed  on day 2 prior to patient being discharged   She was given perioperative antibiotics:  Anti-infectives (From admission, onward)   Start     Dose/Rate Route Frequency Ordered Stop   08/22/17 1400  ceFAZolin (ANCEF) 2 g in dextrose 5 % 100 mL IVPB     2 g 240 mL/hr over 30 Minutes Intravenous Every 6 hours 08/22/17 1328 08/23/17 1359   08/22/17 0606  ceFAZolin (ANCEF) 2-4 GM/100ML-% IVPB    Comments:  Lyman Bishop   : cabinet override      08/22/17 0606 08/22/17 0742   08/22/17 0600  ceFAZolin (ANCEF) IVPB 2g/100 mL premix     2 g 200 mL/hr over 30 Minutes Intravenous On call to O.R. 08/21/17 2221 08/22/17 1914    .  She was fitted with AV 1 compression foot pump devices, instructed on heel pumps, early ambulation, and fitted with TED stockings bilaterally for DVT prophylaxis.  She benefited maximally from the hospital stay and there were no complications.    Recent vital signs:  Vitals:   08/22/17 2344 08/23/17 0329  BP: (!) 105/49 (!) 107/50  Pulse: 70 75  Resp: 19 18  Temp: 98.6 F (37 C) 98.7 F (37.1 C)  SpO2: 97% 95%    Recent laboratory studies:  Lab Results  Component Value Date   HGB 11.5 (L) 08/23/2017   HGB 13.5 08/07/2017   HGB 13.5 05/31/2017   Lab Results  Component Value Date   WBC 14.7 (H) 08/23/2017   PLT 231 08/23/2017   Lab Results  Component Value Date   INR 1.00 08/07/2017   Lab Results  Component Value Date   NA 136 08/23/2017   K 3.5 08/23/2017   CL 105 08/23/2017   CO2 23 08/23/2017   BUN 11 08/23/2017   CREATININE 0.81 08/23/2017   GLUCOSE 149 (H) 08/23/2017    Discharge Medications:   Allergies as of 08/23/2017      Reactions   Sulfa Antibiotics Other (See Comments)   Unknown      Medication List    TAKE these medications   busPIRone 10 MG tablet Commonly known as:  BUSPAR TAKE 1 TABLET (10 MG TOTAL) BY MOUTH 2 (TWO) TIMES DAILY.   enoxaparin 40 MG/0.4ML injection Commonly known as:  LOVENOX Inject 0.4 mLs (40 mg  total) daily into the skin. Start taking on:  08/25/2017   ibuprofen 200 MG tablet Commonly known as:  ADVIL,MOTRIN Take 800 mg by mouth every 6 (six) hours as needed for headache or moderate pain.   levothyroxine 50 MCG tablet Commonly known as:  SYNTHROID, LEVOTHROID Take 1 tablet (50 mcg total) by mouth daily.   metoprolol tartrate 25 MG tablet Commonly known as:  LOPRESSOR Take 25 mg by mouth 2 (two) times daily.   omeprazole 20 MG tablet Commonly known as:  PRILOSEC OTC Take 20 mg by mouth at bedtime.   oxybutynin 10 MG 24 hr tablet Commonly known as:  DITROPAN-XL Take 1 tablet (10 mg total) by mouth at bedtime.   oxyCODONE 5 MG immediate release tablet Commonly known as:  Oxy IR/ROXICODONE Take 1 tablet (5 mg total) every 3 (three) hours as needed by mouth for moderate pain ((score 4 to 6)).   sertraline 50 MG tablet Commonly known as:  ZOLOFT Take 1 tablet (50 mg total) by mouth daily. What changed:  additional instructions   traMADol 50 MG tablet Commonly known as:  ULTRAM Take 1-2 tablets (50-100 mg total) every 4 (four) hours as needed by mouth for moderate pain.   zolpidem 10 MG tablet Commonly known as:  AMBIEN Take 1 tablet (10 mg total) by mouth at bedtime as needed. for sleep What changed:    reasons to take this  additional instructions            Durable Medical Equipment  (From admission, onward)        Start     Ordered   08/22/17 1329  DME Walker rolling  Once    Question:  Patient needs a walker to treat with the following condition  Answer:  S/P total hip arthroplasty   08/22/17 1328   08/22/17 1329  DME Bedside commode  Once    Question:  Patient needs a bedside commode to treat with the following condition  Answer:  S/P total hip arthroplasty   08/22/17 1328      Diagnostic Studies: Dg Hip Port Unilat With Pelvis 1v Right  Result Date: 08/22/2017 CLINICAL DATA:  Status post right hip replacement. EXAM: DG HIP (WITH OR WITHOUT  PELVIS) 1V PORT RIGHT COMPARISON:  None. FINDINGS: Right hip bipolar prosthesis in satisfactory position and alignment. No fracture or dislocation seen. IMPRESSION: Satisfactory postoperative appearance of a right hip prosthesis. Electronically Signed   By: Claudie Revering M.D.   On: 08/22/2017 11:48    Disposition: 01-Home or Self Care  Discharge Instructions    Diet - low sodium heart healthy   Complete by:  As directed    Increase activity slowly   Complete by:  As directed       Follow-up Information    Hooten, Laurice Record, MD On 10/04/2017.   Specialty:  Orthopedic Surgery Why:  at 9:45am Contact information: [place] [address] [phone]            Signed: Watt Climes. 08/23/2017, 7:04 AM

## 2017-08-23 NOTE — Clinical Social Work Placement (Signed)
   CLINICAL SOCIAL WORK PLACEMENT  NOTE  Date:  08/23/2017  Patient Details  Name: [name] MRN: [id number] Date of Birth: January 19, 1956  Clinical Social Work is seeking post-discharge placement for this patient at the [place] level of care (*CSW will initial, date and re-position this form in  chart as items are completed):  Yes   Patient/family provided with [facility] Work Department's list of facilities offering this level of care within the geographic area requested by the patient (or if unable, by the patient's family).  Yes   Patient/family informed of their freedom to choose among providers that offer the needed level of care, that participate in Medicare, Medicaid or managed care program needed by the patient, have an available bed and are willing to accept the patient.  Yes   Patient/family informed of [facility]'s ownership interest in [facility] and [facility], as well as of the fact that they are under no obligation to receive care at these facilities.  PASRR submitted to EDS on 08/22/17     PASRR number received on 08/22/17     Existing PASRR number confirmed on       FL2 transmitted to all facilities in geographic area requested by pt/family on 08/23/17     FL2 transmitted to all facilities within larger geographic area on       Patient informed that his/her managed care company has contracts with or will negotiate with certain facilities, including the following:        Yes   Patient/family informed of bed offers received.  Patient chooses bed at ([facility] )     Physician recommends and patient chooses bed at      Patient to be transferred to   on  .  Patient to be transferred to facility by       Patient family notified on   of transfer.  Name of family member notified:        PHYSICIAN       Additional Comment:    _______________________________________________ Jahne Krukowski, Veronia Beets, LCSW 08/23/2017, 3:04  PM

## 2017-08-24 LAB — CBC
HEMATOCRIT: 32.8 % — AB (ref 35.0–47.0)
HEMOGLOBIN: 11.1 g/dL — AB (ref 12.0–16.0)
MCH: 30 pg (ref 26.0–34.0)
MCHC: 34 g/dL (ref 32.0–36.0)
MCV: 88.2 fL (ref 80.0–100.0)
Platelets: 215 10*3/uL (ref 150–440)
RBC: 3.72 MIL/uL — AB (ref 3.80–5.20)
RDW: 14.1 % (ref 11.5–14.5)
WBC: 10.3 10*3/uL (ref 3.6–11.0)

## 2017-08-24 LAB — BASIC METABOLIC PANEL
ANION GAP: 8 (ref 5–15)
BUN: 10 mg/dL (ref 6–20)
CO2: 25 mmol/L (ref 22–32)
Calcium: 8.3 mg/dL — ABNORMAL LOW (ref 8.9–10.3)
Chloride: 103 mmol/L (ref 101–111)
Creatinine, Ser: 0.64 mg/dL (ref 0.44–1.00)
GFR calc Af Amer: >60 mL/min (ref >60)
GFR calc non Af Amer: >60 mL/min (ref >60)
GLUCOSE: 110 mg/dL — AB (ref 65–99)
POTASSIUM: 3.2 mmol/L — AB (ref 3.5–5.1)
Sodium: 136 mmol/L (ref 135–145)

## 2017-08-24 LAB — SURGICAL PATHOLOGY

## 2017-08-24 MED ORDER — POTASSIUM CHLORIDE 20 MEQ PO PACK
40.0000 meq | PACK | Freq: Two times a day (BID) | ORAL | Status: DC
  Filled 2017-08-24: qty 2

## 2017-08-24 MED ORDER — ENOXAPARIN SODIUM 40 MG/0.4ML ~~LOC~~ SOLN: 40.0000 mg | Freq: Two times a day (BID) | SUBCUTANEOUS | 0 refills | Status: DC

## 2017-08-24 MED ORDER — LACTULOSE 10 GM/15ML PO SOLN: 10.0000 g | Freq: Two times a day (BID) | ORAL | Status: DC | PRN

## 2017-08-24 NOTE — Progress Notes (Addendum)
Per [facility] liaison [employer] SNF [facility] was started yesterday and it is unlikely they will get auth today. Per [facility] they can't accept patient on a 5 day LOG. Clinical Education officer, museum (CSW) Surveyor, quantity approved 5 day LOG. Per Juliann Pulse liaison for [facility] they will accept patient on a 5 day LOG today. CSW met with patient and made her aware of above. Patient is agreeable to going to [facility] today and understands that if [facility] denies SNF then she will have to discharge from [facility] after 5 days.   Per Juliann Pulse patient can come to [facility] today. RN will call report and arrange EMS for transport. CSW sent D/C orders to [facility] via [place] [facility]. Patient is aware of above. CSW attempted to contact patient's son Merrilee Seashore however he did not answer and a voicemail was left. CSW also attempted to contact patient's relative Rena however she did not answer and a voicemail was left. Please reconsult if future social work needs arise. CSW signing off.   Patient's daughter in law Mearl Latin called CSW back and was made aware of above.   [employer], LCSW [phone]

## 2017-08-24 NOTE — Clinical Social Work Placement (Addendum)
   CLINICAL SOCIAL WORK PLACEMENT  NOTE  Date:  08/24/2017  Patient Details  Name: Denise Glass MRN: [id number] Date of Birth: [birth year]  Clinical Social Work is seeking post-discharge placement for this patient at the [place] level of care (*CSW will initial, date and re-position this form in  chart as items are completed):  Yes   Patient/family provided with [facility] Work Department's list of facilities offering this level of care within the geographic area requested by the patient (or if unable, by the patient's family).  Yes   Patient/family informed of their freedom to choose among providers that offer the needed level of care, that participate in Medicare, Medicaid or managed care program needed by the patient, have an available bed and are willing to accept the patient.  Yes   Patient/family informed of [facility]'s ownership interest in [facility] and [facility], as well as of the fact that they are under no obligation to receive care at these facilities.  PASRR submitted to EDS on 08/22/17     PASRR number received on 08/22/17     Existing PASRR number confirmed on       FL2 transmitted to all facilities in geographic area requested by pt/family on 08/23/17     FL2 transmitted to all facilities within larger geographic area on       Patient informed that his/her managed care company has contracts with or will negotiate with certain facilities, including the following:        Yes   Patient/family informed of bed offers received.  Patient chooses bed at [facility] )     Physician recommends and patient chooses bed at      Patient to be transferred to [facility] ) on 08/24/17.  Patient to be transferred to facility by [facility] )     Patient family notified on 08/24/17 of transfer.  Name of family member notified:  (CSW left patient's son Merrilee Seashore and relative Mearl Latin a Advertising account executive. ) Patient's  daughter in law Mearl Latin called CSW back and was made aware of D/C today.   PHYSICIAN       Additional Comment:    _______________________________________________ Elih Mooney, Veronia Beets, LCSW 08/24/2017, 11:28 AM

## 2017-08-24 NOTE — Progress Notes (Signed)
   Subjective: 2 Days Post-Op Procedure(s) (LRB): TOTAL HIP ARTHROPLASTY (Right) Patient reports pain as 8 on 0-10 scale.   Patient is well, and has had no acute complaints or problems Did not do well with therapy yesterday. Most of the ambulation was 10 feet. Patient complained of being nauseated and vomiting yesterday. Still not feeling all that well. Plan is to go Rehab after hospital stay. no nausea and no vomiting during the night Patient denies any chest pains or shortness of breath.  Objective: Vital signs in last 24 hours: Temp:  [97.6 F (36.4 C)-98.4 F (36.9 C)] 98.4 F (36.9 C) (11/08 1933) Pulse Rate:  [64-82] 74 (11/08 1933) Resp:  [17] 17 (11/08 1933) BP: (106-136)/(49-66) 136/66 (11/08 1933) SpO2:  [96 %-98 %] 98 % (11/08 1933) well approximated incision Heels are non tender and elevated off the bed using rolled towels Intake/Output from previous day: 11/08 0701 - 11/09 0700 In: 1358.3 [P.O.:240; I.V.:1118.3] Out: -  Intake/Output this shift: No intake/output data recorded.  Recent Labs    08/23/17 0317 08/24/17 0429  HGB 11.5* 11.1*   Recent Labs    08/23/17 0317 08/24/17 0429  WBC 14.7* 10.3  RBC 3.83 3.72*  HCT 33.8* 32.8*  PLT 231 215   Recent Labs    08/23/17 0317 08/24/17 0429  NA 136 136  K 3.5 3.2*  CL 105 103  CO2 23 25  BUN 11 10  CREATININE 0.81 0.64  GLUCOSE 149* 110*  CALCIUM 8.5* 8.3*   No results for input(s): LABPT, INR in the last 72 hours.  EXAM General - Patient is Alert, Appropriate and Oriented Extremity - Neurologically intact Neurovascular intact Sensation intact distally Intact pulses distally Dorsiflexion/Plantar flexion intact No cellulitis present Compartment soft Dressing - dressing C/D/I Motor Function - intact, moving foot and toes well on exam.    Past Medical History:  Diagnosis Date  . Complication of anesthesia    pt reports her appetite takes 2-3 weeks to come back after anesthesia.  .  Depression   . DJD (degenerative joint disease)   . GERD (gastroesophageal reflux disease)   . Headache 2016   cluster head aches, Head aches have subsided.  . Hypertension   . Hypothyroidism   . Insomnia   . Mitral valve prolapse 2003   Followed by Dr. Clayborn Bigness (prn)  . PONV (postoperative nausea and vomiting) 2002   With BTL  . Urine frequency     Assessment/Plan: 2 Days Post-Op Procedure(s) (LRB): TOTAL HIP ARTHROPLASTY (Right) Active Problems:   Status post total replacement of hip  Estimated body mass index is 44.61 kg/m as calculated from the following:   Height as of this encounter: 5' 1.5" (1.562 m).   Weight as of this encounter: 108.9 kg (240 lb). Up with therapy Discharge to SNF  Labs: Were reviewed. Potassium 3.2. We'll add Klor-Con DVT Prophylaxis - Lovenox, Foot Pumps and TED hose Weight-Bearing as tolerated to right leg Hemovac discontinued on today's visit. Patient needs a bowel movement prior to being discharged.  Jillyn Ledger. [facility] 08/24/2017, 6:14 AM

## 2017-08-24 NOTE — Progress Notes (Signed)
Attempted to call report to [employer] x 2. Will try again shortly.  Bethann Punches, RN

## 2017-08-24 NOTE — Care Management (Signed)
Lovenox cancelled with [facility]. Updated Kinded at home of patient going to SNF. No other RNCM needs.

## 2017-08-24 NOTE — Progress Notes (Signed)
Physical Therapy Treatment Patient Details Name: Denise Glass MRN: [id number] DOB: May 12, 1956 Today's Date: 08/24/2017    History of Present Illness Pt underwent R THR posterior approach, PT and OT started POD#1. PMH includes depression, GERD, HTN, and bilateral TKAs.     PT Comments    [name] has been unable to make significant progress with mobility due to nausea.  Pt ambulated 12 ft before onset of nausea and urgent need to sit.  Pt dry heaving once sitting after ambulating.  No emesis.  SNF remains most appropriate d/c plan at this time.    Follow Up Recommendations  SNF     Equipment Recommendations  Other (comment)(has rw )    Recommendations for Other Services       Precautions / Restrictions Precautions Precautions: Posterior Hip Precaution Comments: pt able to verbalize 3/3 posterior total hip precautions independently Restrictions Weight Bearing Restrictions: Yes RLE Weight Bearing: Weight bearing as tolerated    Mobility  Bed Mobility Overal bed mobility: Needs Assistance Bed Mobility: Supine to Sit     Supine to sit: Mod assist;HOB elevated     General bed mobility comments: Pt requires assist for BLEs OOB and assist to elevate trunk  Transfers Overall transfer level: Needs assistance Equipment used: Rolling walker (2 wheeled) Transfers: Sit to/from Stand Sit to Stand: Min guard;From elevated surface         General transfer comment: Close min guard.  Cues to scoot to EOB for ease with standing.  Bed slightly elevated.  Pt is slow to stand with flexed posture, cues provided for upright posture.   Ambulation/Gait Ambulation/Gait assistance: Min guard Ambulation Distance (Feet): 12 Feet Assistive device: Rolling walker (2 wheeled) Gait Pattern/deviations: Step-to pattern;Decreased stride length;Decreased stance time - right;Decreased step length - left;Antalgic;Trunk flexed;Wide base of support Gait velocity: Decreased Gait velocity  interpretation: Below normal speed for age/gender General Gait Details: Cues for sequencing using RW.  Pt with flexed posture.  Limited ambulatory distance due to nausea.     Stairs            Wheelchair Mobility    Modified Rankin (Stroke Patients Only)       Balance Overall balance assessment: Needs assistance Sitting-balance support: Feet supported;No upper extremity supported Sitting balance-Leahy Scale: Good Sitting balance - Comments: Initially mild dizziness, which resolved after ~20 seconds   Standing balance support: Bilateral upper extremity supported;During functional activity Standing balance-Leahy Scale: Poor Standing balance comment: Pt relies on UE support for static and dynamic activities                            Cognition Arousal/Alertness: Awake/alert Behavior During Therapy: WFL for tasks assessed/performed Overall Cognitive Status: Within Functional Limits for tasks assessed                                        Exercises Total Joint Exercises Ankle Circles/Pumps: AROM;Both;10 reps;Supine Quad Sets: Strengthening;Both;10 reps;Supine Hip ABduction/ADduction: AAROM;10 reps;Supine;Right    General Comments General comments (skin integrity, edema, etc.): Pt dry heaving once sitting after ambulating.  No emesis.       Pertinent Vitals/Pain Pain Assessment: Faces Faces Pain Scale: Hurts even more Pain Location: R hip Pain Descriptors / Indicators: Aching;Grimacing Pain Intervention(s): Limited activity within patient's tolerance;Monitored during session;Repositioned    Home Living  Prior Function            PT Goals (current goals can now be found in the care plan section) Acute Rehab PT Goals Patient Stated Goal: to feel better so she can walk farther PT Goal Formulation: With patient Time For Goal Achievement: 09/07/17 Potential to Achieve Goals: Good Progress towards PT goals:  Not progressing toward goals - comment(due to nausea)    Frequency    BID      PT Plan Current plan remains appropriate    Co-evaluation              AM-PAC PT "6 Clicks" Daily Activity  Outcome Measure  Difficulty turning over in bed (including adjusting bedclothes, sheets and blankets)?: Unable Difficulty moving from lying on back to sitting on the side of the bed? : Unable Difficulty sitting down on and standing up from a chair with arms (e.g., wheelchair, bedside commode, etc,.)?: A Lot Help needed moving to and from a bed to chair (including a wheelchair)?: A Little Help needed walking in hospital room?: A Little Help needed climbing 3-5 steps with a railing? : A Lot 6 Click Score: 12    End of Session Equipment Utilized During Treatment: Gait belt Activity Tolerance: Treatment limited secondary to medical complications (Comment)(nausea, dry heaving) Patient left: in chair;with call bell/phone within reach;with chair alarm set;Other (comment)(SNF in room talking to pt)   PT Visit Diagnosis: Unsteadiness on feet (R26.81);Muscle weakness (generalized) (M62.81);Difficulty in walking, not elsewhere classified (R26.2);Pain Pain - Right/Left: Right Pain - part of body: Hip     Time: 2440-1027 PT Time Calculation (min) (ACUTE ONLY): 28 min  Charges:  $Gait Training: 8-22 mins $Therapeutic Activity: 8-22 mins                    G Codes:       Collie Siad PT, DPT 08/24/2017, 10:57 AM

## 2017-09-27 ENCOUNTER — Other Ambulatory Visit: Payer: Self-pay | Admitting: Nurse Practitioner

## 2017-10-30 ENCOUNTER — Ambulatory Visit: Payer: BLUE CROSS/BLUE SHIELD | Admitting: Nurse Practitioner

## 2017-10-30 ENCOUNTER — Encounter: Payer: Self-pay | Admitting: Nurse Practitioner

## 2017-10-30 DIAGNOSIS — F331 Major depressive disorder, recurrent, moderate: Secondary | ICD-10-CM | POA: Diagnosis not present

## 2017-10-30 DIAGNOSIS — F419 Anxiety disorder, unspecified: Secondary | ICD-10-CM

## 2017-10-30 DIAGNOSIS — F5104 Psychophysiologic insomnia: Secondary | ICD-10-CM

## 2017-10-30 MED ORDER — ZOLPIDEM TARTRATE 10 MG PO TABS: 10.0000 mg | ORAL_TABLET | Freq: Every evening | ORAL | 5 refills | Status: DC | PRN

## 2017-10-30 MED ORDER — METOPROLOL TARTRATE 25 MG PO TABS: 12.5000 mg | ORAL_TABLET | Freq: Two times a day (BID) | ORAL | 1 refills | Status: DC

## 2017-10-30 MED ORDER — SERTRALINE HCL 100 MG PO TABS: 100.0000 mg | ORAL_TABLET | Freq: Every day | ORAL | 5 refills | Status: DC

## 2017-10-30 MED ORDER — BUSPIRONE HCL 10 MG PO TABS: 15.0000 mg | ORAL_TABLET | Freq: Two times a day (BID) | ORAL | 1 refills | Status: DC

## 2017-10-30 NOTE — Progress Notes (Signed)
Subjective:    Patient ID: Denise Glass, female    DOB: 04-02-1956, 62 y.o.   MRN: [id number]  Denise Glass is a 62 y.o. female presenting on 10/30/2017 for Depression (follow up had improved but not it's backward)   HPI Depression Worsening of her depression since last visit.  Pt is feeling she is still in constant pain after hip surgery and is contributing to her moods. She had R total hip replacement surgery on Nov 7th, but is still having daily pain from same hip.  Dec 20 6 week checkup.  Is still walking with pain.  R leg has popping sound, muscle is still weak and painful.  Yesterday, popped 4 times and had not popped in 1 week.   "Feels she has traded in one pain for another." - Pt has been taking Tylenol and ibuprofen.  Has used oxycodone for a few pills 1 per week since discharge from rehab. - Has finished PT.  PT didn't have benefit for pain/popping. Last treatment was about 2 weeks ago.  Is only intermittently continuing her PT exercises.  - Now is back to crying all the time. Verbalizes she is now 43, mother has died and now she feels like her "life is over."   - Significant caregiving for her mother after August and lost her memory.  Passed away 2023-10-20.  - Now working on wrapping up legal matters. - Found value and significance with other people when she was at outpatient rehab.  States she would like to go volunteer there, but feels her current pain is preventing this. - Also states, [facility], but again is concerned about her current level of pain.   Social History   Tobacco Use  . Smoking status: Never Smoker  . Smokeless tobacco: Never Used  Substance Use Topics  . Alcohol use: No  . Drug use: No    Review of Systems Per HPI unless specifically indicated above     Objective:    BP 122/69   Pulse 72   Temp 97.7 F (36.5 C) (Oral)   Resp 16   Ht 5\' 1"  (1.549 m)   Wt 239 lb 3.2 oz (108.5 kg)   BMI 45.20 kg/m   Wt  Readings from Last 3 Encounters:  10/30/17 239 lb 3.2 oz (108.5 kg)  08/22/17 240 lb (108.9 kg)  08/07/17 240 lb (108.9 kg)    Physical Exam General - morbidly obese, well-appearing, NAD HEENT - Normocephalic, atraumatic Neck - supple, non-tender, no LAD, no thyromegaly, no carotid bruit Heart - RRR, no murmurs heard Lungs - Clear throughout all lobes, no wheezing, crackles, or rhonchi. Normal work of breathing. Extremeties - non-tender, no edema, cap refill < 2 seconds, peripheral pulses intact +2 bilaterally Musculoskeletal - tender right hip muscles at low back, IT band, anterior thigh is not tender, but achy/ weak with movement. Skin - warm, dry Neuro - awake, alert, oriented x3, normal gait Psych - Depressed mood and affect, tearful during HPI, otherwise normal behavior      Assessment & Plan:   Problem List Items Addressed This Visit      Other   Insomnia   Relevant Medications   sertraline (ZOLOFT) 100 MG tablet   zolpidem (AMBIEN) 10 MG tablet   Anxiety   Relevant Medications   sertraline (ZOLOFT) 100 MG tablet   busPIRone (BUSPAR) 10 MG tablet   Moderate episode of recurrent major depressive disorder (  Denise Glass)    Pt with anxiety and depression today worsened with unresolved hip pain after hip replacement surgery.  Pt with worsened insomnia, anhedonia, and pain.  More tearfulness and down moods. Pt with partial response w/ sertraline and buspirone.  No experienced side effects, so desires to increase sertraline at for more improvement.    Plan: 1. Increase sertraline to 100 mg once daily. Can increase to max dose 200 mg daily if needed.  - START buspirone 10 mg bid. - Can continue ambien 10 mg once daily at bedtime as needed.  Discussed with pt that she should avoid 10 mg dose as it is no longer recommended for women to take the higher dose.  Pt verbalizes understanding of risks.  Desires to continue 10 mg dose, reporting 5 mg is not effective. 2. Encouraged pt to begin more  regular exercise program that is scheduled to encourage her to get up and out of her home. 3. Follow up 4 weeks.      Relevant Medications   sertraline (ZOLOFT) 100 MG tablet   busPIRone (BUSPAR) 10 MG tablet      Meds ordered this encounter  Medications  . metoprolol tartrate (LOPRESSOR) 25 MG tablet    Sig: Take 0.5 tablets (12.5 mg total) by mouth 2 (two) times daily.    Dispense:  45 tablet    Refill:  1    Order Specific Question:   Supervising Provider    Answer:   Denise Glass [[id number]]  . sertraline (ZOLOFT) 100 MG tablet    Sig: Take 1 tablet (100 mg total) by mouth daily.    Dispense:  30 tablet    Refill:  5    Order Specific Question:   Supervising Provider    Answer:   Denise Glass [[id number]]  . busPIRone (BUSPAR) 10 MG tablet    Sig: Take 1.5 tablets (15 mg total) by mouth 2 (two) times daily. For 3 weeks, then return to 10 mg twice daily and continue.    Dispense:  180 tablet    Refill:  1    Order Specific Question:   Supervising Provider    Answer:   Denise Glass [[id number]]  . DISCONTD: zolpidem (AMBIEN) 10 MG tablet    Sig: Take 1 tablet (10 mg total) by mouth at bedtime as needed. for sleep    Dispense:  30 tablet    Refill:  5    Order Specific Question:   Supervising Provider    Answer:   Denise Glass [[id number]]  . zolpidem (AMBIEN) 10 MG tablet    Sig: Take 1 tablet (10 mg total) by mouth at bedtime as needed. for sleep    Dispense:  30 tablet    Refill:  5    Order Specific Question:   Supervising Provider    Answer:   Denise Glass [[id number]]    Follow up plan: Return in about 6 weeks (around 12/11/2017) for depression.  A total of 40 minutes was spent face-to-face with this patient. Greater than 50% of this time was spent in counseling and coordination of care with the patient.   Denise Smiles, DNP, AGPCNP-BC Adult Gerontology Primary Care Nurse Practitioner [facility] 11/18/2017, 9:26 PM

## 2017-10-30 NOTE — Patient Instructions (Addendum)
Denise Glass, Thank you for coming in to clinic today.  1. You are doing very well so far with your hip recovery.  For your pain control and ongoing pain, contact your orthopedic office again.   2. For your depression: - INCREASE Sertaline to 100 mg once daily. - While this medicine is increasing, take buspirone 15 mg twice daily.  (Three 5 mg tablets total per dose).  Take this higher dose for 3 weeks, then return to 10 mg twice daily.  3. Increase your physical activity (pool).  4. Work toward at least once weekly volunteering.    Please schedule a follow-up appointment with [name], AGNP. Return in about 6 weeks (around 12/11/2017) for depression.  If you have any other questions or concerns, please feel free to call the clinic or send a message through [place]. You may also schedule an earlier appointment if necessary.  You will receive a survey after today's visit either digitally by e-mail or paper by [employer]. Your experiences and feedback matter to [place].  Please respond so we know how we are doing as we provide care for you.   [name], DNP, AGNP-BC Adult Gerontology Nurse Practitioner [place]

## 2017-11-18 ENCOUNTER — Encounter: Payer: Self-pay | Admitting: Nurse Practitioner

## 2017-11-18 NOTE — Assessment & Plan Note (Signed)
Pt with anxiety and depression today worsened with unresolved hip pain after hip replacement surgery.  Pt with worsened insomnia, anhedonia, and pain.  More tearfulness and down moods. Pt with partial response w/ sertraline and buspirone.  No experienced side effects, so desires to increase sertraline at for more improvement.    Plan: 1. Increase sertraline to 100 mg once daily. Can increase to max dose 200 mg daily if needed.  - START buspirone 10 mg bid. - Can continue ambien 10 mg once daily at bedtime as needed.  Discussed with pt that she should avoid 10 mg dose as it is no longer recommended for women to take the higher dose.  Pt verbalizes understanding of risks.  Desires to continue 10 mg dose, reporting 5 mg is not effective. 2. Encouraged pt to begin more regular exercise program that is scheduled to encourage her to get up and out of her home. 3. Follow up 4 weeks.

## 2017-12-17 ENCOUNTER — Other Ambulatory Visit: Payer: Self-pay | Admitting: Nurse Practitioner

## 2017-12-17 DIAGNOSIS — F5104 Psychophysiologic insomnia: Secondary | ICD-10-CM

## 2017-12-27 ENCOUNTER — Other Ambulatory Visit: Payer: Self-pay

## 2017-12-27 MED ORDER — OXYBUTYNIN CHLORIDE ER 10 MG PO TB24: ORAL_TABLET | ORAL | 1 refills | Status: DC

## 2018-01-11 ENCOUNTER — Encounter: Payer: Self-pay | Admitting: Nurse Practitioner

## 2018-01-11 ENCOUNTER — Other Ambulatory Visit: Payer: Self-pay

## 2018-01-11 ENCOUNTER — Ambulatory Visit: Payer: BLUE CROSS/BLUE SHIELD | Admitting: Nurse Practitioner

## 2018-01-11 VITALS — BP 132/88 | HR 70 | Temp 98.3°F | Ht 61.0 in | Wt 240.0 lb

## 2018-01-11 DIAGNOSIS — B350 Tinea barbae and tinea capitis: Secondary | ICD-10-CM | POA: Diagnosis not present

## 2018-01-11 DIAGNOSIS — Z6841 Body Mass Index (BMI) 40.0 and over, adult: Secondary | ICD-10-CM | POA: Insufficient documentation

## 2018-01-11 MED ORDER — KETOCONAZOLE 2 % EX SHAM: 1.0000 "application " | MEDICATED_SHAMPOO | CUTANEOUS | 0 refills | Status: DC

## 2018-01-11 MED ORDER — CLOTRIMAZOLE 1 % EX CREA: 1.0000 "application " | TOPICAL_CREAM | Freq: Two times a day (BID) | CUTANEOUS | 0 refills | Status: DC

## 2018-01-11 NOTE — Progress Notes (Signed)
Subjective:    Patient ID: Denise Glass, female    DOB: 03-06-1956, 62 y.o.   MRN: [id number]  Denise Glass is a 62 y.o. female presenting on 01/11/2018 for Ear Pain (left side ear pain mostly in the back of the ear. Swelling x 3-4 mths and painful to the touch. x 1 day )   HPI Left Ear pain Patient reports mild swelling behind her left ear for the last 3-4 months.  Today it is painful to touch and started yesterday.  She is also had compulsive itching of her scalp over the last 3-4 months, but patient reports this was an OCD tendency which worsened after the dose of 6 family members over 2 months.  Itching and scratching of the scalp has not improved, but depression and grief has improved. -Patient denies URI symptoms during any point this last winter.  She has had no ear pain, muffled hearing sounds, sinus congestion, or other URI.   Social History   Tobacco Use  . Smoking status: Never Smoker  . Smokeless tobacco: Never Used  Substance Use Topics  . Alcohol use: No  . Drug use: No    Review of Systems Per HPI unless specifically indicated above     Objective:    BP 132/88 (BP Location: Right Arm, Patient Position: Sitting, Cuff Size: Large)   Pulse 70   Temp 98.3 F (36.8 C) (Oral)   Ht 5\' 1"  (1.549 m)   Wt 240 lb (108.9 kg)   BMI 45.35 kg/m   Wt Readings from Last 3 Encounters:  01/11/18 240 lb (108.9 kg)  10/30/17 239 lb 3.2 oz (108.5 kg)  08/22/17 240 lb (108.9 kg)    Physical Exam  Constitutional: She is oriented to person, place, and time. She appears well-developed and well-nourished. No distress.  HENT:  Head: Normocephalic and atraumatic.    Right Ear: Hearing, tympanic membrane, external ear and ear canal normal.  Left Ear: Hearing, tympanic membrane, external ear and ear canal normal.  Nose: No mucosal edema or rhinorrhea. Right sinus exhibits no maxillary sinus tenderness and no frontal sinus tenderness. Left sinus exhibits no maxillary sinus  tenderness and no frontal sinus tenderness.  Mouth/Throat: Uvula is midline and mucous membranes are normal. Oropharyngeal exudate: clear secretions.  Post auricular edema is above mastoid process.  Mastoid processes nontender to palpation bilaterally.    Neck: Normal range of motion. Neck supple.  Cardiovascular: Normal rate, regular rhythm, S1 normal, S2 normal, normal heart sounds and intact distal pulses.  Pulmonary/Chest: Effort normal and breath sounds normal. No respiratory distress.  Lymphadenopathy:       Head (right side): Posterior auricular adenopathy present. No preauricular adenopathy present.       Head (left side): Posterior auricular adenopathy present. No preauricular adenopathy present.    She has cervical adenopathy.       Right cervical: Posterior cervical adenopathy present.       Left cervical: Posterior cervical adenopathy present.  Neurological: She is alert and oriented to person, place, and time. She has normal reflexes.  Skin: Skin is warm and dry.  Psychiatric: Her speech is normal and behavior is normal. Judgment and thought content normal. Her mood appears anxious (Mildly). Cognition and memory are normal.  Vitals reviewed.      Assessment & Plan:   Problem List Items Addressed This Visit    None    Visit Diagnoses    Tinea capitis    -  Primary  Relevant Medications   clotrimazole (LOTRIMIN) 1 % cream   ketoconazole (NIZORAL) 2 % shampoo (Start on 01/14/2018)    Acute tinea capitis located behind left ear.  Subacute diffuse tinea capitis is chronic over entire scalp.  Scalp with chronic inflammation and irritation.  These are outside of lesions from hair picking and scratching.  Plan: 1.  Start using ketoconazole shampoo twice weekly times 6 weeks. 2.  Place clotrimazole ointment behind left ear twice daily x 14 days. 3.  Monitor for signs and symptoms of mastoiditis although suspicion is very low today.  Reviewed emergency signs and symptoms with  patient and encouraged her to visit ED if they occur. 4.  Follow-up as needed.  Meds ordered this encounter  Medications  . clotrimazole (LOTRIMIN) 1 % cream    Sig: Apply 1 application topically 2 (two) times daily.    Dispense:  30 g    Refill:  0    Order Specific Question:   Supervising Provider    Answer:   Olin Hauser [[id number]]  . ketoconazole (NIZORAL) 2 % shampoo    Sig: Apply 1 application topically 2 (two) times a week.    Dispense:  100 mL    Refill:  0    Order Specific Question:   Supervising Provider    Answer:   Olin Hauser [[id number]]    Follow up plan: Return 3-5 days if symptoms worsen or fail to improve.  [name], DNP, AGPCNP-BC Adult Gerontology Primary Care Nurse Practitioner [facility] 01/11/2018, 12:04 PM

## 2018-01-11 NOTE — Patient Instructions (Addendum)
Denise Glass,   Thank you for coming in to clinic today.  Apply fungal clotrimazole cream to left ear twice daily for 14 days.  Apply ketoconazole shampoo twice weekly for about 6 weeks.  If the pain behind your ear worsens in next couple of days, please seek care at urgent care or ED.  There is an emergency condition that would require IV antibiotics.  I do not believe you have this today, but worsening symptoms could indicate this.  Please schedule a follow-up appointment with [name], AGNP. Return 3-5 days if symptoms worsen or fail to improve.  If you have any other questions or concerns, please feel free to call the clinic or send a message through [place]. You may also schedule an earlier appointment if necessary.  You will receive a survey after today's visit either digitally by e-mail or paper by [employer]. Your experiences and feedback matter to [place].  Please respond so we know how we are doing as we provide care for you.   [name], DNP, AGNP-BC Adult Gerontology Nurse Practitioner [place]

## 2018-02-26 ENCOUNTER — Other Ambulatory Visit: Payer: Self-pay

## 2018-02-26 ENCOUNTER — Encounter: Payer: Self-pay | Admitting: Nurse Practitioner

## 2018-02-26 ENCOUNTER — Ambulatory Visit: Payer: BLUE CROSS/BLUE SHIELD | Admitting: Nurse Practitioner

## 2018-02-26 VITALS — BP 128/80 | HR 57 | Ht 61.0 in | Wt 245.0 lb

## 2018-02-26 DIAGNOSIS — R079 Chest pain, unspecified: Secondary | ICD-10-CM

## 2018-02-26 DIAGNOSIS — R002 Palpitations: Secondary | ICD-10-CM

## 2018-02-26 DIAGNOSIS — N3942 Incontinence without sensory awareness: Secondary | ICD-10-CM | POA: Diagnosis not present

## 2018-02-26 DIAGNOSIS — G479 Sleep disorder, unspecified: Secondary | ICD-10-CM | POA: Diagnosis not present

## 2018-02-26 DIAGNOSIS — N3281 Overactive bladder: Secondary | ICD-10-CM | POA: Diagnosis not present

## 2018-02-26 DIAGNOSIS — F5104 Psychophysiologic insomnia: Secondary | ICD-10-CM | POA: Diagnosis not present

## 2018-02-26 DIAGNOSIS — R232 Flushing: Secondary | ICD-10-CM

## 2018-02-26 DIAGNOSIS — R350 Frequency of micturition: Secondary | ICD-10-CM

## 2018-02-26 DIAGNOSIS — F331 Major depressive disorder, recurrent, moderate: Secondary | ICD-10-CM

## 2018-02-26 MED ORDER — SERTRALINE HCL 50 MG PO TABS: 75.0000 mg | ORAL_TABLET | Freq: Every day | ORAL | 1 refills | Status: DC

## 2018-02-26 MED ORDER — OXYBUTYNIN CHLORIDE ER 15 MG PO TB24
ORAL_TABLET | ORAL | 1 refills | Status: DC
Start: 2018-02-26 — End: 2018-08-24

## 2018-02-26 MED ORDER — ZOLPIDEM TARTRATE 10 MG PO TABS: 10.0000 mg | ORAL_TABLET | Freq: Every evening | ORAL | 1 refills | Status: DC | PRN

## 2018-02-26 NOTE — Patient Instructions (Addendum)
[name],   Thank you for coming in to clinic today.  1. Stop buspirone.  This could be a cause for your sweating.    2. Increase sertraline to 75 mg daily. Take 1.5 tablets. - I'm glad to hear that your depression and symptoms are improving overall!  Keep taking some time for yourself and for your church activities.  3. Increase of oxybutynin XL to 15 mg once daily. - Referral to Urology.  They will call you.  Location of their office: [place]  4. If you are having chest pain, hot flashes/sweating with heart racing that continues with stopping buspirone, we need to look for another cause.  If it does not resolve with rest, do not hesitate to call 9-1-1 for emergency response.  These could be symptoms of a heart attack or lack of blood flow to your heart arteries.  Please schedule a follow-up appointment with [name], AGNP. Return in about 3 months (around 05/29/2018) for depression AND as needed sooner if continuing to have hot flashes and heart palpitations.  If you have any other questions or concerns, please feel free to call the clinic or send a message through [place]. You may also schedule an earlier appointment if necessary.  You will receive a survey after today's visit either digitally by e-mail or paper by [employer]. Your experiences and feedback matter to [place].  Please respond so we know how we are doing as we provide care for you.   [name], DNP, AGNP-BC Adult Gerontology Nurse Practitioner [place]

## 2018-02-26 NOTE — Progress Notes (Signed)
Subjective:    Patient ID: Denise Glass, female    DOB: 30-Oct-1955, 62 y.o.   MRN: [id number]  Denise Glass is a 62 y.o. female presenting on 02/26/2018 for Anxiety; Hot Flashes (intermittent heart palpation, sweating from the waist up x 1.5 mths  ); and Urinary Incontinence (urgency, pt states the oxybutnin is not working as well. )   HPI Anxiety/Depression Significantly less anhedonia.  Now feels like life is very good.  She has resumed regular activities with family, exercising at the gym, and weekly is going to church on Sundays.  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  Pt states she is regularly able to get up and get going for the day, but still has difficulty with sleeping.  Increased buspirone to 15 mg bid for 2 weeks then returned to 10 mg bid.  Has continued sertraline 50 mg once daily.  We had tried increase to 100 mg in past, but this was accompanied by headaches.   Hot Flashes  Pt reports no hot flashes in perimenopausal period, but reports these feel similar to what she would expect.  There is no known or identified pattern to the sweating, but they are also associated with heart palpitations and occasionally with substernal chest pain.   - Pt has been taking buspirone which has a known side effect of diaphoresis. - Last appointment with Dr. Clayborn Bigness was approximately 1 year ago.  Pt reports having had a request to have a holter monitor placed at one of her most recent visits (02/2017), but this was never completed or there is no record of it in [place].   Urinary Urgency Has urinary incontinence and urinary urgency regularly.  Previously, urgency has been managed well with oxybutynin XL 10 mg once daily.  Has urgency when oxybutynin wears off usually in afternoon over the last several weeks, so pt has been taking two doses daily on most days.  She states she has a concern about why she would be needing more  medication and mentions that she wants to be sure she doesn't have cancer.  Chronically, pt reports a functional incontinence without sensation of urge that is not continuous.  She also has occasional urge incontinence and stress incontinence.     Depression screen [facility] 2/9 02/26/2018 10/30/2017 06/05/2017 04/05/2017 03/13/2017  Decreased Interest 2 2 1 3 3   Down, Depressed, Hopeless 1 3 1 3 3   PHQ - 2 Score 3 5 2 6 6   Altered sleeping 3 3 3 3 3   Tired, decreased energy 3 3 3 3 3   Change in appetite 3 3 3 3 1   Feeling bad or failure about yourself  3 3 0 3 3  Trouble concentrating 0 2 0 2 3  Moving slowly or fidgety/restless 0 0 0 1 0  Suicidal thoughts 0 2 0 1 0  PHQ-9 Score 15 21 11 22 19   Difficult doing work/chores Somewhat difficult Very difficult - Not difficult at all Very difficult     Social History   Tobacco Use  . Smoking status: Never Smoker  . Smokeless tobacco: Never Used  Substance Use Topics  . Alcohol use: No  . Drug use: No    Review of Systems Per HPI unless specifically indicated above     Objective:    BP 128/80 (BP Location: Left Wrist, Patient Position: Sitting, Cuff Size: Normal)   Pulse (!) 57   Ht 5\' 1"  (  1.549 m)   Wt 245 lb (111.1 kg)   BMI 46.29 kg/m   Wt Readings from Last 3 Encounters:  02/26/18 245 lb (111.1 kg)  01/11/18 240 lb (108.9 kg)  10/30/17 239 lb 3.2 oz (108.5 kg)    Physical Exam  Constitutional: She is oriented to person, place, and time. She appears well-developed and well-nourished. No distress.  HENT:  Head: Normocephalic and atraumatic.  Neck: Normal range of motion. Neck supple. Carotid bruit is not present.  Cardiovascular: Regular rhythm, S1 normal, S2 normal, normal heart sounds, intact distal pulses and normal pulses. Bradycardia present.  No murmur heard. During exam, pt experienced dull substernal chest pain without heart racing or sweating that resolved in seconds.  Pulmonary/Chest: Effort normal and breath sounds  normal. No respiratory distress.  Abdominal: Soft. Bowel sounds are normal. There is no hepatosplenomegaly (on scratch test).  Musculoskeletal: She exhibits no edema (pedal).  Neurological: She is alert and oriented to person, place, and time.  Skin: Skin is warm and dry. Capillary refill takes less than 2 seconds.  Psychiatric: She has a normal mood and affect. Her speech is normal and behavior is normal. Judgment and thought content normal. Cognition and memory are normal.  Vitals reviewed.   12-lead EKG: Normal Sinus Bradycardia HR 49-57 mild artifact 2/2 large, pendulous breasts  PR 178 ms; QRS 92 ms; QTc 60 ms     Assessment & Plan:   Problem List Items Addressed This Visit      Other   Insomnia   Relevant Medications   zolpidem (AMBIEN) 10 MG tablet   Sleep disturbance   Moderate episode of recurrent major depressive disorder (HCC) - Primary   Relevant Medications   sertraline (ZOLOFT) 50 MG tablet   Other Relevant Orders   Ambulatory referral to Urology    Other Visit Diagnoses    Urinary incontinence without sensory awareness       Relevant Medications   oxybutynin (DITROPAN XL) 15 MG 24 hr tablet   Other Relevant Orders   Ambulatory referral to Urology   Urinalysis   Overactive bladder       Relevant Medications   oxybutynin (DITROPAN XL) 15 MG 24 hr tablet   Other Relevant Orders   Ambulatory referral to Urology   Urinalysis   Urinary frequency       Relevant Medications   oxybutynin (DITROPAN XL) 15 MG 24 hr tablet   Other Relevant Orders   Ambulatory referral to Urology   Urinalysis   Chest pain, unspecified type       Relevant Orders   EKG 12-Lead   Palpitations       Relevant Orders   EKG 12-Lead   Hot flashes       Relevant Orders   EKG 12-Lead      #1: Insomnia, depression Moderately improved since last visit with increase in social activities and regular schedule.  Pt continues to experience sleep difficulties and is continuing to tolerate  [place] well without side effects currently. - Change in buspirone dose occurred at last visit and pt with new sweating episodes that could be a side effect of buspirone.  Plan: 1. Increase sertraline to 75 mg once daily.  Take 1.5 tablets of sertraline 50 mg tablet. 2. STOP buspirone.  Can resume at 5 mg bid or 7.5 mg bid if no change in diaphoresis or if needed in future. 3. Encouraged pt to continue taking time for self care and spiritual support with church.   #  2 Diaphoresis/hot flash/palpitations/chest pain No current symptoms in clinic as experienced at home.  Pt has had substernal chest pain lasting seconds during visit.  Subacute to chronic occurrence of symptoms over last 1.5 months with recent medication change of buspar which could cause diaphoresis, but less likely palpitations and chest pain. Pt also with bradycardia and cannot exclude demand ischemia despite normal EKG today.  Previously, has reduced dose of metoprolol for bradycardia per Dr. Clayborn Bigness.    Plan: 1. May need to repeat visit to Dr. Clayborn Bigness or new referral to [facility] heartcare per pt preference.   2. Consider holter monitor as previously requested by Dr. Clayborn Bigness to evaluate palpitations. 3. Consider stopping metoprolol, but given palpitation history will wait.  4. Pt requests to defer cardiac workup for 2 weeks during trial off buspirone for resolution of possible side effects. 5. Followup in 2 weeks, sooner if needed and in 3  Months.   #3 Urinary urgency with incontinence Pt with chronic urinary urgency likely 2/2 OAB and previously stable on oxybutynin XL.  Pt with self increase of oxybutynin XL 10 mg twice daily (20 mg daily).  Pt with concerns about why symptoms are worsening.  No symptoms indicating hematuria.  Plan: 1. Increase oxybutyninXL to 15 mg tablet.  Reviewed with pt do not take any additional medication or higher dose. 2. Refer urology.  Pt reports no prior urology workup. 3. Continue regular  bathroom visits at least every 4 hours. 4. Followup as needed.  Meds ordered this encounter  Medications  . sertraline (ZOLOFT) 50 MG tablet    Sig: Take 1.5 tablets (75 mg total) by mouth daily.    Dispense:  135 tablet    Refill:  1    Order Specific Question:   Supervising Provider    Answer:   Olin Hauser [[id number]]  . oxybutynin (DITROPAN XL) 15 MG 24 hr tablet    Sig: TAKE 1 TABLET BY MOUTH EVERYDAY AT BEDTIME    Dispense:  90 tablet    Refill:  1    Order Specific Question:   Supervising Provider    Answer:   Olin Hauser [[id number]]  . zolpidem (AMBIEN) 10 MG tablet    Sig: Take 1 tablet (10 mg total) by mouth at bedtime as needed for sleep.    Dispense:  30 tablet    Refill:  1    Order Specific Question:   Supervising Provider    Answer:   Olin Hauser [[id number]]    Follow up plan: Return in about 3 months (around 05/29/2018) for depression AND as needed sooner if continuing to have hot flashes and heart palpitations.  A total of 45 minutes was spent face-to-face with this patient. Greater than 50% of this time was spent in counseling and coordination of care with the patient for conditions as in A/P above.    [name], DNP, AGPCNP-BC Adult Gerontology Primary Care Nurse Practitioner [facility] 02/26/2018, 8:22 AM

## 2018-03-14 ENCOUNTER — Telehealth: Payer: Self-pay | Admitting: Nurse Practitioner

## 2018-03-14 DIAGNOSIS — R079 Chest pain, unspecified: Secondary | ICD-10-CM

## 2018-03-14 DIAGNOSIS — R002 Palpitations: Secondary | ICD-10-CM

## 2018-03-14 DIAGNOSIS — R0602 Shortness of breath: Secondary | ICD-10-CM

## 2018-03-14 NOTE — Telephone Encounter (Signed)
The pt states she is extremely fatigue. She states just taking her trash out exhaust her. She would like to proceed with the cardiology referral. Please advise

## 2018-03-14 NOTE — Telephone Encounter (Signed)
Pt asked for a referral to cardio.  Her call back number is [phone]

## 2018-03-31 NOTE — Progress Notes (Signed)
04/01/2018 3:51 PM   Denise Glass 1956/05/09 [id number]  Referring provider: Mikey College, NP [place], [place] [address]  Chief Complaint  Patient presents with  . Establish Care    OAB    HPI: Patient is a 62 -year-old Caucasian female who is referred to [place] by Mikey College, NP for urinary incontinence.  Patient states that she has had urinary incontinence for years.    Patient has incontinence with mild stress incontinence, wears pads and at the end of the day it is moist.  She does not have urge incontinence.  She was recently increased to 15 mg of oxybutynin from 10 mg.  This was about one month ago.  She is experiencing no incontinent episodes during the night.  Her incontinence volume is mild.   She is wearing one pads/depends daily.    She is having associated urinary frequency x 5,  nocturia x 1, intermittency and straining to urinate.  She bends forward to have the rest of the urine come out.    She does not have a history of urinary tract infections, STI's or injury to the bladder.  She denies dysuria, gross hematuria, suprapubic pain, back pain, abdominal pain or flank pain.  She had one episode of left flank pain that started yesterday.  It is still hurting today and happened getting off the couch.    She is having nausea.  She has not had any recent fevers, chills or vomiting.   She does not have a history of nephrolithiasis, GU surgery or GU trauma.   She is not sexually active.    She is post menopausal.   She admits to diarrhea that started on Friday.  She is not drinking any water daily.   She is drinking 6 cans of regular Coke daily.  She is drinking two to three glasses of tea daily.  She drinks one cup of coffee daily.  She is not drinking juices.  She is drinking not drinking alcoholic beverages.    She is very fearful of having bladder cancer.  She has not family history of bladder cancer.  She is not a smoker.  She has not  worked with chemicals.    Her PVR is 25 mL.  Her UA was negative.    Reviewed referral notes.  Urinary Urgency Has urinary incontinence and urinary urgency regularly.  Previously, urgency has been managed well with oxybutynin XL 10 mg once daily.  Has urgency when oxybutynin wears off usually in afternoon over the last several weeks, so pt has been taking two doses daily on most days.  She states she has a concern about why she would be needing more medication and mentions that she wants to be sure she doesn't have cancer.  Chronically, pt reports a functional incontinence without sensation of urge that is not continuous.  She also has occasional urge incontinence and stress incontinence.    Patient was complaining of chest pain, exacerbation of her MVP and right sided facial pain.  She was advised to seek treatment in the ED four times during our visit, but she refused.  Advised her that this may be a sign of MI or CVA and could result in death if she didn't seek treatment.    PMH: Past Medical History:  Diagnosis Date  . Complication of anesthesia    pt reports her appetite takes 2-3 weeks to come back after anesthesia.  . Depression   . DJD (  degenerative joint disease)   . GERD (gastroesophageal reflux disease)   . Headache 2016   cluster head aches, Head aches have subsided.  . Hypertension   . Hypothyroidism   . Insomnia   . Mitral valve prolapse 2003   Followed by Dr. Clayborn Bigness (prn)  . PONV (postoperative nausea and vomiting) 2002   With BTL  . Urine frequency     Surgical History: Past Surgical History:  Procedure Laterality Date  . BREAST BIOPSY  2001   Calcification - bx done by Dr. Raylene Everts  . BREAST SURGERY  2001   biopsy  . COLONOSCOPY WITH PROPOFOL N/A 11/13/2016   Procedure: COLONOSCOPY WITH PROPOFOL;  Surgeon: Manya Silvas, MD;  Location: [facility] ENDOSCOPY;  Service: Endoscopy;  Laterality: N/A;  . JOINT REPLACEMENT  2008   left and right knee replaced  .  KNEE ARTHROPLASTY  2007   Right Knee  . KNEE ARTHROPLASTY  2008   Left Knee  . left total knee replacement    . right total knee    . TOTAL HIP ARTHROPLASTY Right 08/22/2017   Procedure: TOTAL HIP ARTHROPLASTY;  Surgeon: Dereck Leep, MD;  Location: [facility] ORS;  Service: Orthopedics;  Laterality: Right;  . TUBAL LIGATION      Home Medications:  Allergies as of 04/01/2018      Reactions   Sulfa Antibiotics Other (See Comments)   Unknown      Medication List        Accurate as of 04/01/18  3:51 PM. Always use your most recent med list.          ibuprofen 200 MG tablet Commonly known as:  ADVIL,MOTRIN Take 800 mg by mouth every 6 (six) hours as needed for headache or moderate pain.   levothyroxine 50 MCG tablet Commonly known as:  SYNTHROID, LEVOTHROID Take 1 tablet (50 mcg total) by mouth daily.   metoprolol tartrate 25 MG tablet Commonly known as:  LOPRESSOR Take 0.5 tablets (12.5 mg total) by mouth 2 (two) times daily.   omeprazole 20 MG tablet Commonly known as:  PRILOSEC OTC Take 20 mg by mouth at bedtime.   oxybutynin 15 MG 24 hr tablet Commonly known as:  DITROPAN XL TAKE 1 TABLET BY MOUTH EVERYDAY AT BEDTIME   sertraline 50 MG tablet Commonly known as:  ZOLOFT Take 1.5 tablets (75 mg total) by mouth daily.   zolpidem 10 MG tablet Commonly known as:  AMBIEN Take 1 tablet (10 mg total) by mouth at bedtime as needed for sleep.       Allergies:  Allergies  Allergen Reactions  . Sulfa Antibiotics Other (See Comments)    Unknown    Family History: Family History  Problem Relation Age of Onset  . Heart disease Father   . COPD Father   . Cancer Father        throat  . COPD Mother   . Heart disease Mother   . Stroke Mother   . Healthy Brother   . Healthy Son   . Drug abuse Brother     Social History:  reports that she has never smoked. She has never used smokeless tobacco. She reports that she does not drink alcohol or use  drugs.  ROS: UROLOGY Frequent Urination?: Yes Hard to postpone urination?: No Burning/pain with urination?: No Get up at night to urinate?: Yes Leakage of urine?: Yes Urine stream starts and stops?: Yes Trouble starting stream?: Yes Do you have to strain to urinate?: Yes  Blood in urine?: No Urinary tract infection?: No Sexually transmitted disease?: No Injury to kidneys or bladder?: No Painful intercourse?: No Weak stream?: No Currently pregnant?: No Vaginal bleeding?: No Last menstrual period?: n  Gastrointestinal Nausea?: Yes Vomiting?: No Indigestion/heartburn?: No Diarrhea?: Yes Constipation?: No  Constitutional Fever: No Night sweats?: No Weight loss?: No Fatigue?: Yes  Skin Skin rash/lesions?: No Itching?: No  Eyes Blurred vision?: No Double vision?: No  Ears/Nose/Throat Sore throat?: No Sinus problems?: No  Hematologic/Lymphatic Swollen glands?: No Easy bruising?: No  Cardiovascular Leg swelling?: No Chest pain?: Yes  Respiratory Cough?: No Shortness of breath?: No  Endocrine Excessive thirst?: Yes  Musculoskeletal Back pain?: No Joint pain?: No  Neurological Headaches?: Yes Dizziness?: No  Psychologic Depression?: Yes Anxiety?: Yes  Physical Exam: BP (!) 142/78   Pulse 70   Ht 5\' 1"  (1.549 m)   Wt 247 lb 14.4 oz (112.4 kg)   BMI 46.84 kg/m   Constitutional: Well nourished. Alert and oriented, No acute distress. HEENT: [place] AT, moist mucus membranes. Trachea midline, no masses. Cardiovascular: No clubbing, cyanosis, or edema. Respiratory: Normal respiratory effort, no increased work of breathing. GI: Abdomen is soft, non tender, non distended, no abdominal masses. Liver and spleen not palpable.  No hernias appreciated.  Stool sample for occult testing is not indicated.   GU: No CVA tenderness.  No bladder fullness or masses.  Atrophic external genitalia, normal pubic hair distribution, no lesions.  Normal urethral meatus, no  lesions, no prolapse, no discharge.   No urethral masses, tenderness and/or tenderness. No bladder fullness, tenderness or masses. Pale vagina mucosa, poor estrogen effect, no discharge, no lesions, good pelvic support, Grade II cystocele.  No rectocele noted.  No cervical motion tenderness.  Uterus is freely mobile and non-fixed.  No adnexal/parametria masses or tenderness noted.  Anus and perineum are without rashes or lesions.    Skin: No rashes, bruises or suspicious lesions. Lymph: No cervical or inguinal adenopathy. Neurologic: Grossly intact, no focal deficits, moving all 4 extremities. Psychiatric: Normal mood and affect.  Laboratory Data: Lab Results  Component Value Date   WBC 10.3 08/24/2017   HGB 11.1 (L) 08/24/2017   HCT 32.8 (L) 08/24/2017   MCV 88.2 08/24/2017   PLT 215 08/24/2017    Lab Results  Component Value Date   CREATININE 0.64 08/24/2017    No results found for: PSA  No results found for: TESTOSTERONE  Lab Results  Component Value Date   HGBA1C 5.7 (H) 05/31/2017    Lab Results  Component Value Date   TSH 4.86 (H) 05/31/2017       Component Value Date/Time   CHOL 238 (H) 05/31/2017 0837   HDL 56 05/31/2017 0837   CHOLHDL 4.3 05/31/2017 0837   VLDL 31 (H) 05/31/2017 0837   LDLCALC 151 (H) 05/31/2017 0837    Lab Results  Component Value Date   AST 21 08/07/2017   Lab Results  Component Value Date   ALT 12 (L) 08/07/2017   No components found for: ALKALINEPHOPHATASE No components found for: BILIRUBINTOTAL  No results found for: ESTRADIOL  Urinalysis Negative.  See Epic.   I have reviewed the labs.   Pertinent Imaging: Results for JOVON, STREETMAN (MRN [id number]) as of 04/01/2018 15:09  Ref. Range 04/01/2018 15:08  Scan Result Unknown 25   Assessment & Plan:    1. Stress Incontinence Discussed behavioral therapies (avoiding sugary drinks, drinks with artificial sweeteners and caffeine, limit alcohol and increase fiber intake),  bladder training (voiding q 2 hours while awake), bladder control strategies (try to post pone urination) - she is not wanting to give up her Cokes Offered pelvic floor muscle training and given a brochure - she would like to do more research before deciding on a referral Encouraged patient to drink mostly water and to complete most of their fluid intake by early evening   Offered referral to gynecology for a pessary fitting - patient deferred  2. Vaginal atrophy Has not had annual mammogram - will need prior to starting vaginal estrogen cream  3. Fear of bladder cancer Advised patient that she has no risk factors or symptoms of bladder cancer at this time.  Advised her, that if she should experience gross hematuria or provider tell her that she has microscopic hematuria or develop pain with urination to contact her office for follow-up  Return for patient to call us back .  These notes generated with voice recognition software. I apologize for typographical errors.  Zara Council, PA-C  [facility] [address] [facility] [place], [place] [address] [phone]

## 2018-04-01 ENCOUNTER — Other Ambulatory Visit: Payer: Self-pay

## 2018-04-01 ENCOUNTER — Ambulatory Visit: Payer: BLUE CROSS/BLUE SHIELD | Admitting: Urology

## 2018-04-01 ENCOUNTER — Encounter: Payer: Self-pay | Admitting: Urology

## 2018-04-01 VITALS — BP 142/78 | HR 70 | Ht 61.0 in | Wt 247.9 lb

## 2018-04-01 DIAGNOSIS — N952 Postmenopausal atrophic vaginitis: Secondary | ICD-10-CM | POA: Diagnosis not present

## 2018-04-01 DIAGNOSIS — N3946 Mixed incontinence: Secondary | ICD-10-CM

## 2018-04-01 LAB — BLADDER SCAN AMB NON-IMAGING: Scan Result: 25

## 2018-04-02 LAB — URINALYSIS, COMPLETE
BILIRUBIN UA: NEGATIVE
GLUCOSE, UA: NEGATIVE
KETONES UA: NEGATIVE
Leukocytes, UA: NEGATIVE
NITRITE UA: NEGATIVE
Protein, UA: NEGATIVE
SPEC GRAV UA: 1.01 (ref 1.005–1.030)
UUROB: 0.2 mg/dL (ref 0.2–1.0)
pH, UA: 5 (ref 5.0–7.5)

## 2018-04-02 LAB — MICROSCOPIC EXAMINATION: RBC MICROSCOPIC, UA: NONE SEEN /HPF (ref 0–2)

## 2018-04-23 ENCOUNTER — Ambulatory Visit: Payer: BLUE CROSS/BLUE SHIELD | Admitting: Nurse Practitioner

## 2018-04-23 ENCOUNTER — Other Ambulatory Visit: Payer: Self-pay

## 2018-04-23 ENCOUNTER — Encounter: Payer: Self-pay | Admitting: Nurse Practitioner

## 2018-04-23 VITALS — BP 132/70 | HR 88 | Temp 97.7°F | Ht 61.0 in

## 2018-04-23 DIAGNOSIS — M795 Residual foreign body in soft tissue: Secondary | ICD-10-CM | POA: Diagnosis not present

## 2018-04-23 DIAGNOSIS — W25XXXA Contact with sharp glass, initial encounter: Secondary | ICD-10-CM

## 2018-04-23 NOTE — Patient Instructions (Addendum)
Denise Glass,   Thank you for coming in to clinic today.  1. Continue wound care with regular bathing with mild soap and water.  Keep your wound covered with a bandage for about 24-48 hours or until stops bleeding.  Change about every 12 hours.  After bandage removed, continue to keep covered with a sock.  2. Sweep all floors and wear shoes when doing this to remove all other glass.  Please schedule a follow-up appointment with [name], AGNP. Return if symptoms worsen or fail to improve.  If you have any other questions or concerns, please feel free to call the clinic or send a message through [place]. You may also schedule an earlier appointment if necessary.  You will receive a survey after today's visit either digitally by e-mail or paper by [employer]. Your experiences and feedback matter to [place].  Please respond so we know how we are doing as we provide care for you.   [name], DNP, AGNP-BC Adult Gerontology Nurse Practitioner [facility], [place], Adult Taking care of your wound properly can help to prevent pain and infection. It can also help your wound to heal more quickly. How is this treated? Wound care  Follow instructions from your health care provider about how to take care of your wound. Make sure you: ? Wash your hands with soap and water before you change the bandage (dressing). If soap and water are not available, use hand sanitizer. ? Change your dressing as told by your health care provider. ? Leave stitches (sutures), skin glue, or adhesive strips in place. These skin closures may need to stay in place for 2 weeks or longer. If adhesive strip edges start to loosen and curl up, you may trim the loose edges. Do not remove adhesive strips completely unless your health care provider tells you to do that.  Check your wound area every day for signs of infection. Check for: ? More redness, swelling, or pain. ? More fluid or  blood. ? Warmth. ? Pus or a bad smell.  Ask your health care provider if you should clean the wound with mild soap and water. Doing this may include: ? Using a clean towel to pat the wound dry after cleaning it. Do not rub or scrub the wound. ? Applying a cream or ointment. Do this only as told by your health care provider. ? Covering the incision with a clean dressing.  Ask your health care provider when you can leave the wound uncovered. Medicines   If you were prescribed an antibiotic medicine, cream, or ointment, take or use the antibiotic as told by your health care provider. Do not stop taking or using the antibiotic even if your condition improves.  Take over-the-counter and prescription medicines only as told by your health care provider. If you were prescribed pain medicine, take it at least 30 minutes before doing any wound care or as told by your health care provider. General instructions  Return to your normal activities as told by your health care provider. Ask your health care provider what activities are safe.  Do not scratch or pick at the wound.  Keep all follow-up visits as told by your health care provider. This is important.  Eat a diet that includes protein, vitamin A, vitamin C, and other nutrient-rich foods. These help the wound heal: ? Protein-rich foods include meat, dairy, beans, nuts, and other sources. ? Vitamin A-rich foods include carrots and dark  green, leafy vegetables. ? Vitamin C-rich foods include citrus, tomatoes, and other fruits and vegetables. ? Nutrient-rich foods have protein, carbohydrates, fat, vitamins, or minerals. Eat a variety of healthy foods including vegetables, fruits, and whole grains. Contact a health care provider if:  You received a tetanus shot and you have swelling, severe pain, redness, or bleeding at the injection site.  Your pain is not controlled with medicine.  You have more redness, swelling, or pain around the  wound.  You have more fluid or blood coming from the wound.  Your wound feels warm to the touch.  You have pus or a bad smell coming from the wound.  You have a fever or chills.  You are nauseous or you vomit.  You are dizzy. Get help right away if:  You have a red streak going away from your wound.  The edges of the wound open up and separate.  Your wound is bleeding and the bleeding does not stop with gentle pressure.  You have a rash.  You faint.  You have trouble breathing. This information is not intended to replace advice given to you by your health care provider. Make sure you discuss any questions you have with your health care provider. Document Released: 07/11/2008 Document Revised: 05/31/2016 Document Reviewed: 04/18/2016 Elsevier Interactive Patient Education  2017 [employer].

## 2018-04-24 ENCOUNTER — Encounter: Payer: Self-pay | Admitting: Nurse Practitioner

## 2018-04-24 MED ORDER — LIDOCAINE-EPINEPHRINE 2 %-1:100000 IJ SOLN
0.5000 mL | Freq: Once | INTRAMUSCULAR | Status: AC
  Administered 2018-04-23: 0.5 mL via INTRADERMAL

## 2018-04-24 NOTE — Progress Notes (Addendum)
Subjective:    Patient ID: Denise Glass, female    DOB: 1955/10/18, 61 y.o.   MRN: [id number]  Denise Glass is a 61 y.o. female presenting on 04/23/2018 for Foot Injury (pt think she got a piece of glass or something in her Rt foot x 4 days ago )   HPI RIGHT foot injury Patient stepped on sharp object in her kitchen 4 days ago.  Patient believes this is glass, but had not broken anything recently.  States it could be anything.  Since initial injury, plantar surface of foot has continued to be painful.  Significantly worsening pain this am upon awakening is what prompted patient to come to clinic.  Social History   Tobacco Use  . Smoking status: Never Smoker  . Smokeless tobacco: Never Used  Substance Use Topics  . Alcohol use: No  . Drug use: No    Review of Systems Per HPI unless specifically indicated above     Objective:    BP 132/70 (BP Location: Right Arm, Patient Position: Sitting, Cuff Size: Normal)   Pulse 88   Temp 97.7 F (36.5 C) (Oral)   Ht 5\' 1"  (1.549 m)   SpO2 99%   BMI 46.84 kg/m   Wt Readings from Last 3 Encounters:  04/01/18 247 lb 14.4 oz (112.4 kg)  02/26/18 245 lb (111.1 kg)  01/11/18 240 lb (108.9 kg)    Physical Exam  Constitutional: She is oriented to person, place, and time. She appears well-developed and well-nourished. No distress.  HENT:  Head: Normocephalic and atraumatic.  Musculoskeletal:       Feet:  Neurological: She is alert and oriented to person, place, and time.  Skin: Skin is warm and dry.  Psychiatric: She has a normal mood and affect. Her behavior is normal.  Vitals reviewed.    Results for orders placed or performed in visit on 04/01/18  Microscopic Examination  Result Value Ref Range   WBC, UA 0-5 0 - 5 /hpf   RBC, UA None seen 0 - 2 /hpf   Epithelial Cells (non renal) 0-10 0 - 10 /hpf   Bacteria, UA Few (A) None seen/Few  Urinalysis, Complete  Result Value Ref Range   Specific Gravity, UA 1.010 1.005 - 1.030    pH, UA 5.0 5.0 - 7.5   Color, UA Yellow Yellow   Appearance Ur Clear Clear   Leukocytes, UA Negative Negative   Protein, UA Negative Negative/Trace   Glucose, UA Negative Negative   Ketones, UA Negative Negative   RBC, UA Trace (A) Negative   Bilirubin, UA Negative Negative   Urobilinogen, Ur 0.2 0.2 - 1.0 mg/dL   Nitrite, UA Negative Negative   Microscopic Examination See below:   Bladder Scan (Post Void Residual) in office  Result Value Ref Range   Scan Result 25       Assessment & Plan:   Problem List Items Addressed This Visit    None    Visit Diagnoses    Foreign body (FB) in soft tissue    -  Primary   Injury from broken glass, initial encounter       Relevant Medications   lidocaine-EPINEPHrine (XYLOCAINE W/EPI) 2 %-1:100000 (with pres) injection 0.5 mL (Completed)    Patient with acute foot injury with small broken glass shard embedded into R foot x 4 days without active abscess or other identified infection.  _____________________________________________________________________ PROCEDURE NOTE Date: 04/23/2018 Incision of R foot to remove embedded glass Verbal  consent given by patient. Time out taken. Area cleaned with alcohol swabs. Local anesthesia with lidocaine 1% with epi 0.5 mL injected into site and surrounding tissue. Area prepped with betadine swabs x 3. Scalpel 11 blade used to make approx 1 cm incision along center of mass. No drainage of liquids other than minimal blood from trauma. Forceps used to retrieve glass piece.  Wound explored to ensure complete removal with forceps.  Area was cleaned, triple antiboitic ointment applied, gauze and loose bandage applied. Patient tolerated well.  No complications. EBL < 5 cc.     Follow up plan: Return if symptoms worsen or fail to improve.  [name], DNP, AGPCNP-BC Adult Gerontology Primary Care Nurse Practitioner [facility] 04/25/2018, 5:12 PM
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
## 2018-04-29 ENCOUNTER — Encounter

## 2018-04-29 ENCOUNTER — Ambulatory Visit: Payer: BLUE CROSS/BLUE SHIELD | Admitting: Cardiovascular Disease

## 2018-04-29 ENCOUNTER — Encounter: Payer: Self-pay | Admitting: Cardiovascular Disease

## 2018-04-29 VITALS — BP 120/74 | HR 58 | Ht 61.0 in | Wt 249.0 lb

## 2018-04-29 DIAGNOSIS — F419 Anxiety disorder, unspecified: Secondary | ICD-10-CM

## 2018-04-29 DIAGNOSIS — I4719 Other supraventricular tachycardia: Secondary | ICD-10-CM | POA: Insufficient documentation

## 2018-04-29 DIAGNOSIS — E782 Mixed hyperlipidemia: Secondary | ICD-10-CM | POA: Diagnosis not present

## 2018-04-29 DIAGNOSIS — R0602 Shortness of breath: Secondary | ICD-10-CM | POA: Diagnosis not present

## 2018-04-29 DIAGNOSIS — I471 Supraventricular tachycardia: Secondary | ICD-10-CM | POA: Diagnosis not present

## 2018-04-29 MED ORDER — METOPROLOL TARTRATE 25 MG PO TABS: 12.5000 mg | ORAL_TABLET | Freq: Two times a day (BID) | ORAL | 4 refills | Status: DC

## 2018-04-29 NOTE — Patient Instructions (Addendum)

## 2018-05-15 ENCOUNTER — Other Ambulatory Visit: Payer: Self-pay | Admitting: Nurse Practitioner

## 2018-05-15 DIAGNOSIS — F5104 Psychophysiologic insomnia: Secondary | ICD-10-CM

## 2018-07-10 ENCOUNTER — Ambulatory Visit: Payer: BLUE CROSS/BLUE SHIELD | Admitting: Nurse Practitioner

## 2018-07-10 ENCOUNTER — Ambulatory Visit (INDEPENDENT_AMBULATORY_CARE_PROVIDER_SITE_OTHER): Payer: BLUE CROSS/BLUE SHIELD | Admitting: Nurse Practitioner

## 2018-07-10 ENCOUNTER — Encounter: Payer: Self-pay | Admitting: Nurse Practitioner

## 2018-07-10 ENCOUNTER — Other Ambulatory Visit: Payer: Self-pay

## 2018-07-10 VITALS — BP 142/70 | HR 59 | Ht 61.0 in | Wt 256.2 lb

## 2018-07-10 DIAGNOSIS — F4321 Adjustment disorder with depressed mood: Secondary | ICD-10-CM

## 2018-07-10 DIAGNOSIS — F331 Major depressive disorder, recurrent, moderate: Secondary | ICD-10-CM

## 2018-07-10 DIAGNOSIS — F419 Anxiety disorder, unspecified: Secondary | ICD-10-CM | POA: Diagnosis not present

## 2018-07-10 MED ORDER — HYDROXYZINE HCL 25 MG PO TABS: 25.0000 mg | ORAL_TABLET | Freq: Three times a day (TID) | ORAL | 0 refills | Status: DC | PRN

## 2018-07-10 NOTE — Progress Notes (Signed)
Subjective:    Patient ID: Denise Glass, female    DOB: January 19, 1956, 62 y.o.   MRN: [id number]  Denise Glass is a 62 y.o. female presenting on 07/10/2018 for Situational Depression (pt just found her boyfriend deceased )   HPI Situational Depression, Grief Patient presents today with significantly increased sadness, inability to control emotions, chest heaviness.  This is in response to finding her boyfriend dead in his home.  This is a long-term relationship and is visibly noticeable that grief is affecting patient significantly with tearfulness and anxiety/depression.  Social History   Tobacco Use  . Smoking status: Never Smoker  . Smokeless tobacco: Never Used  Substance Use Topics  . Alcohol use: No  . Drug use: No    Review of Systems Per HPI unless specifically indicated above     Objective:    BP (!) 142/70 (BP Location: Left Arm, Patient Position: Sitting, Cuff Size: Large)   Pulse (!) 59   Ht 5\' 1"  (1.549 m)   Wt 256 lb 3.2 oz (116.2 kg)   BMI 48.41 kg/m   Wt Readings from Last 3 Encounters:  07/10/18 256 lb 3.2 oz (116.2 kg)  04/29/18 249 lb (112.9 kg)  04/01/18 247 lb 14.4 oz (112.4 kg)    Physical Exam  Constitutional: She is oriented to person, place, and time. She appears well-developed and well-nourished. No distress.  HENT:  Head: Normocephalic and atraumatic.  Cardiovascular: Normal rate, regular rhythm, S1 normal, S2 normal, normal heart sounds and intact distal pulses.  Pulmonary/Chest: Effort normal and breath sounds normal. No respiratory distress.  Neurological: She is alert and oriented to person, place, and time.  Skin: Skin is warm and dry. Capillary refill takes less than 2 seconds.  Psychiatric: Her speech is normal and behavior is normal. Her affect is labile. She exhibits a depressed mood.  Sad affect with tearfulness  Vitals reviewed.  Results for orders placed or performed in visit on 04/01/18  Microscopic Examination  Result  Value Ref Range   WBC, UA 0-5 0 - 5 /hpf   RBC, UA None seen 0 - 2 /hpf   Epithelial Cells (non renal) 0-10 0 - 10 /hpf   Bacteria, UA Few (A) None seen/Few  Urinalysis, Complete  Result Value Ref Range   Specific Gravity, UA 1.010 1.005 - 1.030   pH, UA 5.0 5.0 - 7.5   Color, UA Yellow Yellow   Appearance Ur Clear Clear   Leukocytes, UA Negative Negative   Protein, UA Negative Negative/Trace   Glucose, UA Negative Negative   Ketones, UA Negative Negative   RBC, UA Trace (A) Negative   Bilirubin, UA Negative Negative   Urobilinogen, Ur 0.2 0.2 - 1.0 mg/dL   Nitrite, UA Negative Negative   Microscopic Examination See below:   Bladder Scan (Post Void Residual) in office  Result Value Ref Range   Scan Result 25       Assessment & Plan:   Problem List Items Addressed This Visit      Other   Anxiety   Moderate episode of recurrent major depressive disorder ([place])    Other Visit Diagnoses    Grief    -  Primary    Patient with chronic anxiety and depression, now worsened with grief response acutely.    Plan: 1. Continue sertraline without change 2. Start hydroxyzine 25 mg tab - take 1/2 - 1 tab tid prn for anxiety, grief or overwhelming depression. 3. Encouraged patient to  keep social connections. 4. FOLLOW-UP 2 months.   Meds ordered this encounter  Medications  .  hydrOXYzine (ATARAX/VISTARIL) 25 MG tablet    Sig: Take 1 tablet (25 mg total) by mouth 3 (three) times daily as needed for anxiety.    Dispense:  90 tablet    Refill:  0    Order Specific Question:   Supervising Provider    Answer:   Olin Hauser [[id number]]    Follow up plan: Return in about 2 months (around 09/09/2018) for depression.  [name], DNP, AGPCNP-BC Adult Gerontology Primary Care Nurse Practitioner [facility] 07/10/2018, 3:26 PM

## 2018-07-10 NOTE — Patient Instructions (Addendum)
Denise Glass,   Thank you for coming in to clinic today.  1. START hydroxyzine 25 mg tab.  Take 1/2 - 1 tablet up to three times daily for anxiety, grief, or overwhelming depression.  2. Continue sertraline at 1.5 tabs daily.  We can consider changing this dose in future if needed.  Also can add counseling if needed.  Call clinic if grief is not progressing and your daily activities aren't getting easier.  Please schedule a follow-up appointment with [name], AGNP. Return in about 2 months (around 09/09/2018) for depression.  If you have any other questions or concerns, please feel free to call the clinic or send a message through [place]. You may also schedule an earlier appointment if necessary.  You will receive a survey after today's visit either digitally by e-mail or paper by [employer]. Your experiences and feedback matter to [place].  Please respond so we know how we are doing as we provide care for you.   [name], DNP, AGNP-BC Adult Gerontology Nurse Practitioner [place]

## 2018-07-12 ENCOUNTER — Telehealth: Payer: Self-pay

## 2018-07-12 NOTE — Telephone Encounter (Signed)
Patient called today stating that the medication you gave her is not helping/ strong enough.  Patient reported that she was told to call back if it was not helping her.

## 2018-07-12 NOTE — Telephone Encounter (Signed)
Instructed patient to take hydroxyaine 2 tabs up to three times daily.  If still not helping, will use short term clonazepam 0.25 mg bid. Patient to call back Monday.

## 2018-07-25 ENCOUNTER — Telehealth: Payer: Self-pay | Admitting: Nurse Practitioner

## 2018-07-25 NOTE — Telephone Encounter (Signed)
Pt called requesting a referral to help with her grieving

## 2018-07-25 NOTE — Telephone Encounter (Signed)
Please provide information about [facility].  Patient may also find any counselor for self-referral that offers bereavement if she prefers not going to hospice.

## 2018-07-26 NOTE — Telephone Encounter (Signed)
The pt was notified. No questions or concerns. 

## 2018-07-30 ENCOUNTER — Other Ambulatory Visit: Payer: Self-pay | Admitting: Nurse Practitioner

## 2018-07-30 DIAGNOSIS — E039 Hypothyroidism, unspecified: Secondary | ICD-10-CM

## 2018-08-01 ENCOUNTER — Other Ambulatory Visit: Payer: Self-pay | Admitting: Nurse Practitioner

## 2018-08-01 DIAGNOSIS — F331 Major depressive disorder, recurrent, moderate: Secondary | ICD-10-CM

## 2018-08-01 DIAGNOSIS — F419 Anxiety disorder, unspecified: Secondary | ICD-10-CM

## 2018-08-01 DIAGNOSIS — F4321 Adjustment disorder with depressed mood: Secondary | ICD-10-CM

## 2018-08-07 ENCOUNTER — Other Ambulatory Visit: Payer: Self-pay | Admitting: Nurse Practitioner

## 2018-08-07 DIAGNOSIS — F5104 Psychophysiologic insomnia: Secondary | ICD-10-CM

## 2018-08-23 ENCOUNTER — Ambulatory Visit: Payer: BLUE CROSS/BLUE SHIELD | Admitting: Nurse Practitioner

## 2018-08-23 VITALS — BP 120/80 | HR 58 | Temp 98.0°F | Resp 16 | Ht 61.0 in | Wt 259.8 lb

## 2018-08-23 DIAGNOSIS — J0140 Acute pansinusitis, unspecified: Secondary | ICD-10-CM | POA: Diagnosis not present

## 2018-08-23 MED ORDER — AMOXICILLIN-POT CLAVULANATE 875-125 MG PO TABS: 1.0000 | ORAL_TABLET | Freq: Two times a day (BID) | ORAL | 0 refills | Status: AC

## 2018-08-23 NOTE — Progress Notes (Signed)
Subjective:    Patient ID: Denise Glass, female    DOB: 07-03-1956, 62 y.o.   MRN: [id number]  Denise Glass is a 62 y.o. female presenting on 08/23/2018 for Sore Throat (right side) and Nasal Congestion   HPI URI  07/26/2018 - 4 weeks ago had body aches, fever, chills, sweats, sore throat, cough, rhinorrhea that she felt was flu. She did not have any diagnosis or treatment.  Most symptoms improved, but still has lots of nasal congestion, sore throat.  Yesterday worsened symptoms again with R side pain pain/swelling.  Still having lots of sinus pressure and headaches.  Has had intermittent ear pain as well.  No new fevers, chills, sweats.  Denies any nausea, vomiting, or diarrhea.  - Is taking no OTC meds today.  Has taken alka seltzer cold and benadryl over last 4 weeks.   Social History   Tobacco Use  . Smoking status: Never Smoker  . Smokeless tobacco: Never Used  Substance Use Topics  . Alcohol use: No  . Drug use: No    Review of Systems Per HPI unless specifically indicated above     Objective:    BP 120/80 (BP Location: Left Arm, Patient Position: Sitting, Cuff Size: Large)   Pulse (!) 58   Temp 98 F (36.7 C)   Resp 16   Ht 5\' 1"  (1.549 m)   Wt 259 lb 12.8 oz (117.8 kg)   SpO2 96%   BMI 49.09 kg/m   Wt Readings from Last 3 Encounters:  08/23/18 259 lb 12.8 oz (117.8 kg)  07/10/18 256 lb 3.2 oz (116.2 kg)  04/29/18 249 lb (112.9 kg)    Physical Exam  Constitutional: She appears well-developed and well-nourished.  HENT:  Head: Normocephalic and atraumatic.  Right Ear: Hearing, tympanic membrane, external ear and ear canal normal.  Left Ear: Hearing, tympanic membrane, external ear and ear canal normal.  Nose: Mucosal edema and rhinorrhea present. Right sinus exhibits maxillary sinus tenderness and frontal sinus tenderness. Left sinus exhibits maxillary sinus tenderness and frontal sinus tenderness.  Mouth/Throat: Uvula is midline and mucous membranes  are normal. No oropharyngeal exudate, posterior oropharyngeal edema or posterior oropharyngeal erythema. Tonsils are 0 on the right. Tonsils are 0 on the left.  Eyes: Pupils are equal, round, and reactive to light. Conjunctivae and EOM are normal. Right eye exhibits no discharge. Left eye exhibits no discharge.  Neck: Normal range of motion. Neck supple.  Cardiovascular: Normal rate, regular rhythm, S1 normal, S2 normal, normal heart sounds and intact distal pulses.  Pulmonary/Chest: Effort normal and breath sounds normal. No respiratory distress.  Lymphadenopathy:    She has cervical adenopathy.  Neurological: She is alert.  Skin: Skin is warm and dry. Capillary refill takes less than 2 seconds.  Psychiatric: She has a normal mood and affect. Her behavior is normal.  Vitals reviewed.    Results for orders placed or performed in visit on 04/01/18  Microscopic Examination  Result Value Ref Range   WBC, UA 0-5 0 - 5 /hpf   RBC, UA None seen 0 - 2 /hpf   Epithelial Cells (non renal) 0-10 0 - 10 /hpf   Bacteria, UA Few (A) None seen/Few  Urinalysis, Complete  Result Value Ref Range   Specific Gravity, UA 1.010 1.005 - 1.030   pH, UA 5.0 5.0 - 7.5   Color, UA Yellow Yellow   Appearance Ur Clear Clear   Leukocytes, UA Negative Negative   Protein, UA Negative  Negative/Trace   Glucose, UA Negative Negative   Ketones, UA Negative Negative   RBC, UA Trace (A) Negative   Bilirubin, UA Negative Negative   Urobilinogen, Ur 0.2 0.2 - 1.0 mg/dL   Nitrite, UA Negative Negative   Microscopic Examination See below:   Bladder Scan (Post Void Residual) in office  Result Value Ref Range   Scan Result 25       Assessment & Plan:   Problem List Items Addressed This Visit    None    Visit Diagnoses    Acute non-recurrent pansinusitis    -  Primary   Relevant Medications   amoxicillin-clavulanate (AUGMENTIN) 875-125 MG tablet    Consistent with URI and secondary sinusitis with symptoms  worsening over the past 7 days and initial symptoms of nasal congestion and sinus pressure over 2 weeks ago.   Plan: 1.START taking Augmentin 875-125 mg tablets every 12 hours for 10 days.  Discussed completing antibiotic. - While on antibiotic, take a probiotic OTC or from food. - Continue anti-histamine loratadine 10mg  daily. - Can use Flonase 2 sprays each nostril daily for up to 4-6 weeks if no epistaxis. - Start Mucinex-DM OTC for  7-10 days prn congestion 2. Supportive care with nasal saline, warm herbal tea with honey, 3. Improve hydration 4. Tylenol / Motrin PRN fevers  5. Return criteria given   Meds ordered this encounter  Medications  . amoxicillin-clavulanate (AUGMENTIN) 875-125 MG tablet    Sig: Take 1 tablet by mouth 2 (two) times daily for 10 days.    Dispense:  20 tablet    Refill:  0    Order Specific Question:   Supervising Provider    Answer:   Olin Hauser [[id number]]    Follow up plan: Return in about 6 weeks (around 10/04/2018) for hypertension, anxiety AND as needed in 5-7 days if symptoms worsen or fail to improve.  [name], DNP, AGPCNP-BC Adult Gerontology Primary Care Nurse Practitioner [facility] 08/23/2018, 11:32 AM

## 2018-08-23 NOTE — Patient Instructions (Addendum)
Denise Glass,   Thank you for coming in to clinic today.  1. It sounds like you have a Bacterial Sinus Infection.  Recommend good hand washing. - START Augmentin 875-125 mg one tablet every 12 hours for 10 days. Make sure to take all doses of your antibiotic. - While you are on an antibiotic, take a probiotic.  Antibiotics kill good and bad bacteria.  A probiotic helps to replace your good bacteria. Probiotic pills can be found over the counter.  One brand is Florastor, but you can use any brand you prefer.  You can also get good bacteria from foods.  These foods are yogurt, kefir, kombucha, and fresh, refrigerated and uncooked sauerkraut. - Drink plenty of fluids.  - START anti-histamine loratadine or cetirizine 10mg  daily. - You may also use Flonase 2 sprays each nostril daily for up to 4-6 weeks  Other over the counter medications you may try, if needed for symptoms are: - If congestion is worse, start OTC Mucinex (or may try Mucinex-DM for cough) up to 7-10 days then stop - You may try over the counter Nasal Saline spray (Simply Saline, Ocean Spray) as needed to reduce congestion. - Start taking Tylenol extra strength 1 to 2 tablets every 6-8 hours for aches or fever/chills for next few days as needed.  Do not take more than 3,000 mg in 24 hours from all medicines.  You may also take ibuprofen 200-400mg  every 8 hours as needed.   - Drink warm herbal tea with honey for sore throat.   If symptoms are significantly worse with persistent fevers/chills despite tylenol/ibpurofen, nausea, vomiting unable to tolerate food/fluids or medicine, body aches, or shortness of breath, sinus pain pressure or worsening productive cough, then follow-up for re-evaluation, may seek more immediate care at Urgent Care or the ED if you are more concerned that it is an emergency.   Please schedule a follow-up appointment with [name], AGNP. Return in about 6 weeks (around 10/04/2018) for hypertension,  anxiety AND as needed in 5-7 days if symptoms worsen or fail to improve.  If you have any other questions or concerns, please feel free to call the clinic or send a message through [employer]. You may also schedule an earlier appointment if necessary.  You will receive a survey after today's visit either digitally by e-mail or paper by [employer]. Your experiences and feedback matter to [place].  Please respond so we know how we are doing as we provide care for you.   [name], DNP, AGNP-BC Adult Gerontology Nurse Practitioner [place]

## 2018-08-24 ENCOUNTER — Other Ambulatory Visit: Payer: Self-pay | Admitting: Nurse Practitioner

## 2018-08-24 DIAGNOSIS — N3281 Overactive bladder: Secondary | ICD-10-CM

## 2018-08-24 DIAGNOSIS — R350 Frequency of micturition: Secondary | ICD-10-CM

## 2018-08-24 DIAGNOSIS — N3942 Incontinence without sensory awareness: Secondary | ICD-10-CM

## 2018-08-25 ENCOUNTER — Encounter: Payer: Self-pay | Admitting: Nurse Practitioner

## 2018-09-06 ENCOUNTER — Encounter: Payer: Self-pay | Admitting: Nurse Practitioner

## 2018-09-25 ENCOUNTER — Ambulatory Visit: Payer: BLUE CROSS/BLUE SHIELD | Admitting: Nurse Practitioner

## 2018-09-25 ENCOUNTER — Encounter: Payer: Self-pay | Admitting: Nurse Practitioner

## 2018-09-25 ENCOUNTER — Other Ambulatory Visit: Payer: Self-pay

## 2018-09-25 DIAGNOSIS — F331 Major depressive disorder, recurrent, moderate: Secondary | ICD-10-CM

## 2018-09-25 DIAGNOSIS — F5104 Psychophysiologic insomnia: Secondary | ICD-10-CM

## 2018-09-25 MED ORDER — SERTRALINE HCL 100 MG PO TABS: 100.0000 mg | ORAL_TABLET | Freq: Every day | ORAL | 1 refills | Status: DC

## 2018-09-25 MED ORDER — ZOLPIDEM TARTRATE 10 MG PO TABS: 10.0000 mg | ORAL_TABLET | Freq: Every evening | ORAL | 2 refills | Status: DC | PRN

## 2018-09-25 NOTE — Patient Instructions (Addendum)
Denise Glass,   Thank you for coming in to clinic today.  1. [facility] - Offer bereavement services to anyone (individual and groups)  2. Counseling Self Referral: 1. [name]   Address: [address], [place], [place] [address] Hours: Open today  9AM-7PM Phone: [phone]  2. [facility] [place] Address: [address] [place], [place], [place] [address] Phone: [phone]  - Any other in-network psychologist or social worker who specializes in counseling.  #2 - INCREASE Zoloft to 100 mg once daily   #3 - Keep working toward finding things that bring you joy -the things that you enjoy doing and help you live your life again. You are already doing some of these, so keep up your good work!  Please schedule a follow-up appointment with [name], AGNP. Return in about 6 weeks (around 11/06/2018) for anxiety.  If you have any other questions or concerns, please feel free to call the clinic or send a message through [place]. You may also schedule an earlier appointment if necessary.  You will receive a survey after today's visit either digitally by e-mail or paper by [employer]. Your experiences and feedback matter to [place].  Please respond so we know how we are doing as we provide care for you.   [name], DNP, AGNP-BC Adult Gerontology Nurse Practitioner [place]

## 2018-09-25 NOTE — Progress Notes (Signed)
Subjective:    Patient ID: Denise Glass, female    DOB: [birth year], 62 y.o.   MRN: [id number]  Denise Glass is a 61 y.o. 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 - Has had negative news for her court for her mother. - was encouraged by a friend with loss of child to talk to a counselor was very helpful. - No significant anxiousness, just bothered significantly by sadness - Patient has started going to her grandson's chess meetings weekly, resumed church and bible study.  Is not doing other things during week right how, however.  Does like reading, but has not started this back.   Depression screen [facility] 2/9 09/25/2018 02/26/2018 02/26/2018 10/30/2017 06/05/2017  Decreased Interest 3 2 2 2 1   Down, Depressed, Hopeless 3 1 1 3 1   PHQ - 2 Score 6 3 3 5 2   Altered sleeping 2 3 3 3 3   Tired, decreased energy 3 3 3 3 3   Change in appetite 3 3 3 3 3   Feeling bad or failure about yourself  0 3 3 3  0  Trouble concentrating 3 0 0 2 0  Moving slowly or fidgety/restless 0 0 0 0 0  Suicidal thoughts 2 0 0 2 0  PHQ-9 Score 19 15 15 21 11   Difficult doing work/chores Not difficult at all Somewhat difficult Somewhat difficult Very difficult -   GAD 7 : Generalized Anxiety Score 09/25/2018 06/05/2017 04/05/2017 03/13/2017  Nervous, Anxious, on Edge 3 0 1 3  Control/stop worrying 3 0 3 3  Worry too much - different things 3 0 3 0  Trouble relaxing 0 1 0 1  Restless 0 0 0 0  Easily annoyed or irritable 0 0 0 0  Afraid - awful might happen 0 0 0 1  Total GAD 7 Score 9 1 7 8   Anxiety Difficulty Not difficult at all - Not difficult at all Not difficult at all    Social History   Tobacco Use  . Smoking status: Never Smoker  . Smokeless tobacco: Never Used  Substance Use Topics  . Alcohol use: No  . Drug use: No    Review of  Systems Per HPI unless specifically indicated above     Objective:    BP (!) 142/90 (BP Location: Left Arm, Patient Position: Sitting, Cuff Size: Large)   Pulse 73   Temp 97.7 F (36.5 C) (Oral)   Ht 5\' 1"  (1.549 m)   SpO2 98%   BMI 49.09 kg/m   Wt Readings from Last 3 Encounters:  08/23/18 259 lb 12.8 oz (117.8 kg)  07/10/18 256 lb 3.2 oz (116.2 kg)  04/29/18 249 lb (112.9 kg)    Physical Exam  Constitutional: She is oriented to person, place, and time. She appears well-developed and well-nourished. No distress.  HENT:  Head: Normocephalic and atraumatic.  Cardiovascular: Normal rate, regular rhythm, S1 normal, S2 normal, normal heart sounds and intact distal pulses.  Pulmonary/Chest: Effort normal and breath sounds normal. No respiratory distress.  Neurological: She is alert and oriented to person, place, and time.  Skin: Skin is warm and dry.  Psychiatric: Her speech is normal and behavior is normal. Judgment and thought content normal. Cognition and memory are normal. She exhibits a depressed mood. She expresses no homicidal and no suicidal ideation. She expresses  no suicidal plans and no homicidal plans.  Is frequently tearful during visit.  Vitals reviewed.     Assessment & Plan:   Problem List Items Addressed This Visit      Other   Insomnia   Relevant Medications   zolpidem (AMBIEN) 10 MG tablet   Moderate episode of recurrent major depressive disorder (HCC)   Relevant Medications   sertraline (ZOLOFT) 100 MG tablet      # Depression continues and is not improving with dose increase of Zoloft.  Patient also experiencing grief, not yet complicated.  Plan: 1. Increase Zoloft to 100 mg onc daily 2. Recommend bereavement counseling - resources provided. 3. Encouraged patient to exercise at least 2-3 times weekly. - Start with one 5 minute walk 4. Encouraged patient to start doing activities she used to enjoy. 5. Follow-up 6-8 weeks - consider transfer of care  again for in-network availability with insurance change in January - start now.  Encouraged patient she is always welcome to stay and pay out of network or return once in network again.  Meds ordered this encounter  Medications  . sertraline (ZOLOFT) 100 MG tablet    Sig: Take 1 tablet (100 mg total) by mouth daily.    Dispense:  90 tablet    Refill:  1    Order Specific Question:   Supervising Provider    Answer:   Olin Hauser [[id number]]  . zolpidem (AMBIEN) 10 MG tablet    Sig: Take 1 tablet (10 mg total) by mouth at bedtime as needed for sleep.    Dispense:  30 tablet    Refill:  2    Order Specific Question:   Supervising Provider    Answer:   Olin Hauser [[id number]]    Follow up plan: Return in about 6 weeks (around 11/06/2018) for anxiety.  A total of 27 minutes was spent face-to-face with this patient. Greater than 50% (20/25) of this time was spent in counseling and coordination of care with the patient as above in [place].   [name], DNP, AGPCNP-BC Adult Gerontology Primary Care Nurse Practitioner [facility] 09/25/2018, 11:37 AM

## 2018-11-01 ENCOUNTER — Telehealth: Payer: Self-pay | Admitting: Nurse Practitioner

## 2018-11-01 NOTE — Telephone Encounter (Signed)
Pt asked if there was a test for dementia [phone]

## 2018-11-04 NOTE — Telephone Encounter (Signed)
Covering inbox for Denise Glass, AGPCNP-BC while she is out of office.  Patient was seen in 09/25/18 for depression / anxiety, she was asked to follow-up in 6 weeks. Patient is currently not scheduled for office visit.  Please notify patient that she should schedule follow-up if her mood does not improve with PCP anytime within next 2-3 weeks.  You can let her know that there is no good blood test or imaging test for dementia. Usually we start with a memory test in the office, she usually will need to let us know in advance if she is interested in this so it can be scheduled to do MMSE.  [name], [facility] 11/04/2018, 6:06 PM

## 2018-11-05 NOTE — Telephone Encounter (Signed)
Patient was concerned about family HX of dementia, her mom passed away recently and one of diagnosed in death certificate was  dementia and her daughter in law and son had mentioned in past that she is repeating her self. Patient was advised to schedule an appointment.

## 2018-11-12 ENCOUNTER — Other Ambulatory Visit
Admission: RE | Admit: 2018-11-12 | Discharge: 2018-11-12 | Disposition: A | Discharge status: Home / Self Care | Payer: BLUE CROSS/BLUE SHIELD | Source: Ambulatory Visit | Attending: Ophthalmology | Admitting: Ophthalmology

## 2018-11-12 DIAGNOSIS — M316 Other giant cell arteritis: Secondary | ICD-10-CM | POA: Insufficient documentation

## 2018-11-12 LAB — SEDIMENTATION RATE: Sed Rate: 5 mm/hr (ref 0–30)

## 2018-11-12 LAB — PLATELET COUNT: Platelets: 325 10*3/uL (ref 150–400)

## 2018-11-12 LAB — C-REACTIVE PROTEIN: CRP: 1.4 mg/dL — ABNORMAL HIGH (ref <1.0)

## 2018-11-21 ENCOUNTER — Encounter (INDEPENDENT_AMBULATORY_CARE_PROVIDER_SITE_OTHER): Payer: BLUE CROSS/BLUE SHIELD | Admitting: Vascular Surgery

## 2018-11-22 ENCOUNTER — Other Ambulatory Visit (HOSPITAL_COMMUNITY): Payer: Self-pay | Admitting: Ophthalmology

## 2018-11-22 ENCOUNTER — Other Ambulatory Visit: Payer: Self-pay | Admitting: Ophthalmology

## 2018-11-22 DIAGNOSIS — H0510 Unspecified chronic inflammatory disorders of orbit: Secondary | ICD-10-CM

## 2018-11-26 ENCOUNTER — Ambulatory Visit: Payer: BLUE CROSS/BLUE SHIELD | Admitting: Nurse Practitioner

## 2018-11-26 ENCOUNTER — Other Ambulatory Visit: Payer: Self-pay | Admitting: Ophthalmology

## 2018-11-26 ENCOUNTER — Encounter: Payer: Self-pay | Admitting: Nurse Practitioner

## 2018-11-26 ENCOUNTER — Other Ambulatory Visit: Payer: Self-pay

## 2018-11-26 VITALS — BP 138/90 | HR 81 | Temp 98.0°F | Ht 61.0 in

## 2018-11-26 DIAGNOSIS — B370 Candidal stomatitis: Secondary | ICD-10-CM | POA: Diagnosis not present

## 2018-11-26 DIAGNOSIS — R51 Headache: Secondary | ICD-10-CM

## 2018-11-26 DIAGNOSIS — R519 Headache, unspecified: Secondary | ICD-10-CM

## 2018-11-26 DIAGNOSIS — J349 Unspecified disorder of nose and nasal sinuses: Secondary | ICD-10-CM

## 2018-11-26 DIAGNOSIS — H0510 Unspecified chronic inflammatory disorders of orbit: Secondary | ICD-10-CM

## 2018-11-26 MED ORDER — MAGIC MOUTHWASH: ORAL | 0 refills | Status: DC

## 2018-11-26 NOTE — Progress Notes (Signed)
Subjective:    Patient ID: Denise Glass, female    DOB: 1956-01-01, 63 y.o.   MRN: [id number]  Denise Glass is a 63 y.o. female presenting on 11/26/2018 for Sore Throat (no respiratory issue, but severe sore throat she associates w/ predinisone. The pt currently been on the medication for 2 weeks )   HPI Eye pressure brief interval follow-up: Patient notes she feels devastated about upcoming scan and biopsy.  Patient states symptoms have been suspicious for brain tumor vs Temporal Arteritis.  Now is crying/upset due to fears, lack of desire to do biopsy (patient unaware this is skin biopsy).  - Taking prednisone extended taper with improvement after this for facial numbness.   - Vision remains normal.  Increased eye pressure resolved.  Now concerned for new/persistent facial swelling since being on prednisone.  Sore Throat Back of throat feels swollen and sore.   Has dry, cracking tongue.Patient associated these changes with prednisone.  - Has had shingles in throat in 2001.  Now also feels like there are sores in her esophagus that are similar, but not exactly the same.  Patient has had no heartburn/reflux.   Social History   Tobacco Use  . Smoking status: Never Smoker  . Smokeless tobacco: Never Used  Substance Use Topics  . Alcohol use: No  . Drug use: No    Review of Systems Per HPI unless specifically indicated above     Objective:    BP 138/90 (BP Location: Right Arm, Patient Position: Sitting, Cuff Size: Large)   Pulse 81   Temp 98 F (36.7 C) (Oral)   Ht 5\' 1"  (1.549 m)   SpO2 99%   BMI 49.09 kg/m   Wt Readings from Last 3 Encounters:  08/23/18 259 lb 12.8 oz (117.8 kg)  07/10/18 256 lb 3.2 oz (116.2 kg)  04/29/18 249 lb (112.9 kg)    Physical Exam Vitals signs reviewed.  Constitutional:      General: She is not in acute distress.    Appearance: She is well-developed.  HENT:     Head: Normocephalic and atraumatic.     Right Ear: Hearing, tympanic  membrane, ear canal and external ear normal.     Left Ear: Hearing, tympanic membrane, ear canal and external ear normal.     Nose: Nose normal. No mucosal edema or rhinorrhea.     Right Sinus: No maxillary sinus tenderness or frontal sinus tenderness.     Left Sinus: No maxillary sinus tenderness or frontal sinus tenderness.     Mouth/Throat:     Lips: Pink.     Mouth: Mucous membranes are moist.     Tongue: Lesions (erythematous patches on tongue with white coating) present.     Pharynx: Oropharynx is clear.     Tonsils: No tonsillar exudate or tonsillar abscesses. Swelling: 0 on the right. 0 on the left.  Eyes:     Conjunctiva/sclera: Conjunctivae normal.     Pupils: Pupils are equal, round, and reactive to light.  Cardiovascular:     Rate and Rhythm: Normal rate and regular rhythm.  Pulmonary:     Effort: Pulmonary effort is normal.     Breath sounds: Normal breath sounds.  Skin:    General: Skin is warm and dry.  Neurological:     General: No focal deficit present.     Mental Status: She is alert and oriented to person, place, and time. Mental status is at baseline.  Psychiatric:  Attention and Perception: Attention normal.        Mood and Affect: Mood is anxious. Affect is labile and tearful.        Speech: Speech normal.        Behavior: Behavior normal.        Thought Content: Thought content normal.        Cognition and Memory: Cognition and memory normal.        Judgment: Judgment normal.    Results for orders placed or performed during the hospital encounter of 11/12/18  C-reactive protein  Result Value Ref Range   CRP 1.4 (H) <1.0 mg/dL  Sedimentation rate  Result Value Ref Range   Sed Rate 5 0 - 30 mm/hr  Platelet count  Result Value Ref Range   Platelets 325 150 - 400 K/uL      Assessment & Plan:   Problem List Items Addressed This Visit    None    Visit Diagnoses    Thrush    -  Primary   Relevant Medications   magic mouthwash SOLN        Patient with signs and symptoms consistent with thrush due to prolonged steroid use.  Patient is due to finish steroids in next 3 days.    Plan: 1. START magic mouthwash (without lidocaine) so patient may swallow for esophageal symptoms. - Take 5 mL four times daily after meals and at bedtime for thrush. 2. May also use sore throat spray/lozenges prn for pain relief (OTC) 3. Follow-up prn if no improvements in next 7-10 days.  Meds ordered this encounter  Medications  . magic mouthwash SOLN    Sig: 1 Part diphenhydramine HCL 1 Part Maalox 1 Part Nystatin  Swish, gargle, and swallow.  Take 4 times daily for thrush.    Dispense:  280 mL    Refill:  0    Order Specific Question:   Supervising Provider    Answer:   Olin Hauser [[id number]]    Follow up plan: Return 7-10 days if symptoms worsen or fail to improve.  [name], DNP, AGPCNP-BC Adult Gerontology Primary Care Nurse Practitioner [facility] 11/26/2018, 2:12 PM

## 2018-11-26 NOTE — Patient Instructions (Addendum)
Denise Glass,   Thank you for coming in to clinic today.  1. You have thrush - Swish, gargle, swallow Magic mouthwash three times daily after meals and at bedtime. - May also use Cepacol sore throat spray/lozenge as needed for sore throat.  Please schedule a follow-up appointment with [name], AGNP. Return 7-10 days if symptoms worsen or fail to improve.  If you have any other questions or concerns, please feel free to call the clinic or send a message through [facility]. You may also schedule an earlier appointment if necessary.  You will receive a survey after today's visit either digitally by e-mail or paper by [employer]. Your experiences and feedback matter to [place].  Please respond so we know how we are doing as we provide care for you.   [name], DNP, AGNP-BC Adult Gerontology Nurse Practitioner [facility], [facility]   Oral Ritta Slot, Adult  Oral thrush, also called oral candidiasis, is a fungal infection that develops in the mouth and throat and on the tongue. It causes white patches to form on the mouth and tongue. Ritta Slot is most common in older adults, but it can occur at any age. Many cases of thrush are mild, but this infection can also be serious. Ritta Slot can be a repeated (recurrent) problem for certain people who have a weak body defense system (immune system). The weakness can be caused by chronic illnesses, or by taking medicines that limit the body's ability to fight infection. If a person has difficulty fighting infection, the fungus that causes thrush can spread through the body. This can cause life-threatening blood or organ infections. What are the causes? This condition is caused by a fungus (yeast) called Candida albicans.  This fungus is normally present in small amounts in the mouth and on other mucous membranes. It usually causes no harm.  If conditions are present that allow the fungus to grow without control, it invades surrounding tissues  and becomes an infection.  Other Candida species can also lead to thrush (rare). What increases the risk? This condition is more likely to develop in:  People with a weakened immune system.  Older adults.  People with HIV (human immunodeficiency virus).  People with diabetes.  People with dry mouth (xerostomia).  Pregnant women.  People with poor dental care, especially people who have false teeth.  People who use antibiotic medicines. What are the signs or symptoms? Symptoms of this condition can vary from mild and moderate to severe and persistent. Symptoms may include:  A burning feeling in the mouth and throat. This can occur at the start of a thrush infection.  White patches that stick to the mouth and tongue. The tissue around the patches may be red, raw, and painful. If rubbed (during tooth brushing, for example), the patches and the tissue of the mouth may bleed easily.  A bad taste in the mouth or difficulty tasting foods.  A cottony feeling in the mouth.  Pain during eating and swallowing.  Poor appetite.  Cracking at the corners of the mouth. How is this diagnosed? This condition is diagnosed based on:  Physical exam. Your health care provider will look in your mouth.  Health history. Your health care provider will ask you questions about your health. How is this treated? This condition is treated with medicines called antifungals, which prevent the growth of fungi. These medicines are either applied directly to the affected area (topical) or swallowed (oral). The treatment will depend on the  severity of the condition. Mild thrush Mild cases of thrush may clear up with the use of an antifungal mouth rinse or lozenges. Treatment usually lasts about 14 days. Moderate to severe thrush  More severe thrush infections that have spread to the esophagus are treated with an oral antifungal medicine. A topical antifungal medicine may also be used.  For some severe  infections, treatment may need to continue for more than 14 days.  Oral antifungal medicines are rarely used during pregnancy because they may be harmful to the unborn child. If you are pregnant, talk with your health care provider about options for treatment. Persistent or recurrent thrush For cases of thrush that do not go away or keep coming back:  Treatment may be needed twice as long as the symptoms last.  Treatment will include both oral and topical antifungal medicines.  People with a weakened immune system can take an antifungal medicine on a continuous basis to prevent thrush infections. It is important to treat conditions that make a person more likely to get thrush, such as diabetes or HIV. Follow these instructions at home: Medicines  Take over-the-counter and prescription medicines only as told by your health care provider.  Talk with your health care provider about an over-the-counter medicine called gentian violet, which kills bacteria and fungi. Relieving soreness and discomfort To help reduce the discomfort of thrush:  Drink cold liquids such as water or iced tea.  Try flavored ice treats or frozen juices.  Eat foods that are easy to swallow, such as gelatin, ice cream, or custard.  Try drinking from a straw if the patches in your mouth are painful.  General instructions  Eat plain, unflavored yogurt as directed by your health care provider. Check the label to make sure the yogurt contains live cultures. This yogurt can help healthy bacteria to grow in the mouth and can stop the growth of the fungus that causes thrush.  If you wear dentures, remove the dentures before going to bed, brush them vigorously, and soak them in a cleaning solution as directed by your health care provider.  Rinse your mouth with a warm salt-water mixture several times a day. To make a salt-water mixture, completely dissolve 1/2-1 tsp of salt in 1 cup of warm water. Contact a health care  provider if:  Your symptoms are getting worse or are not improving within 7 days of starting treatment.  You have symptoms of a spreading infection, such as white patches on the skin outside of the mouth. This information is not intended to replace advice given to you by your health care provider. Make sure you discuss any questions you have with your health care provider. Document Released: 06/27/2004 Document Revised: 06/26/2016 Document Reviewed: 06/26/2016 Elsevier Interactive Patient Education  [employer].

## 2018-11-28 ENCOUNTER — Ambulatory Visit: Payer: BLUE CROSS/BLUE SHIELD

## 2018-11-29 ENCOUNTER — Encounter: Payer: Self-pay | Admitting: Nurse Practitioner

## 2019-01-08 ENCOUNTER — Telehealth (INDEPENDENT_AMBULATORY_CARE_PROVIDER_SITE_OTHER): Payer: BLUE CROSS/BLUE SHIELD | Admitting: Nurse Practitioner

## 2019-01-08 NOTE — Progress Notes (Signed)
Open in error

## 2019-01-15 ENCOUNTER — Other Ambulatory Visit: Payer: Self-pay | Admitting: Nurse Practitioner

## 2019-01-15 DIAGNOSIS — F5104 Psychophysiologic insomnia: Secondary | ICD-10-CM

## 2019-02-22 ENCOUNTER — Other Ambulatory Visit: Payer: Self-pay | Admitting: Nurse Practitioner

## 2019-02-22 DIAGNOSIS — B350 Tinea barbae and tinea capitis: Secondary | ICD-10-CM

## 2019-03-17 ENCOUNTER — Telehealth: Payer: Self-pay

## 2019-03-17 NOTE — Telephone Encounter (Signed)
The pt called with some concerns. She said she had a scratch throat on yesterday that subsided. She denies any other symptoms. She wanted to know if we are testing for COVID. I informed the patient that base off her symptoms she doesn't qualify for testing. She verbalize understanding. No questions or concerns

## 2019-03-24 ENCOUNTER — Telehealth: Payer: Self-pay | Admitting: Cardiovascular Disease

## 2019-03-24 NOTE — Telephone Encounter (Signed)
Virtual Visit Pre-Appointment Phone Call  "(Name), I am calling you today to discuss your upcoming appointment. We are currently trying to limit exposure to the virus that causes COVID-19 by seeing patients at home rather than in the office."  1. "What is the BEST phone number to call the day of the visit?" - include this in appointment notes  2. "Do you have or have access to (through a family member/friend) a smartphone with video capability that we can use for your visit?" a. If yes - list this number in appt notes as "cell" (if different from BEST phone #) and list the appointment type as a VIDEO visit in appointment notes b. If no - list the appointment type as a PHONE visit in appointment notes  Confirm consent - "In the setting of the current Covid19 crisis, you are scheduled for a (phone or video) visit with your provider on (date) at (time).  Just as we do with many in-office visits, in order for you to participate in this visit, we must obtain consent.  If you'd like, I can send this to your mychart (if signed up) or email for you to review.  Otherwise, I can obtain your verbal consent now.  All virtual visits are billed to your insurance company just like a normal visit would be.  By agreeing to a virtual visit, we'd like you to understand that the technology does not allow for your provider to perform an examination, and thus may limit your provider's ability to fully assess your condition. If your provider identifies any concerns that need to be evaluated in person, we will make arrangements to do so.  Finally, though the technology is pretty good, we cannot assure that it will always work on either your or our end, and in the setting of a video visit, we may have to convert it to a phone-only visit.  In either situation, we cannot ensure that we have a secure connection.  Are you willing to proceed?" STAFF: Did the patient verbally acknowledge consent to telehealth visit? Document  YES/NO here:  YES  3. Advise patient to be prepared - "Two hours prior to your appointment, go ahead and check your blood pressure, pulse, oxygen saturation, and your weight (if you have the equipment to check those) and write them all down. When your visit starts, your provider will ask you for this information. If you have an Apple Watch or Kardia device, please plan to have heart rate information ready on the day of your appointment. Please have a pen and paper handy nearby the day of the visit as well."  4. Give patient instructions for MyChart download to smartphone OR Doximity/Doxy.me as below if video visit (depending on what platform provider is using)  5. Inform patient they will receive a phone call 15 minutes prior to their appointment time (may be from unknown caller ID) so they should be prepared to answer    TELEPHONE CALL NOTE  Dyan L Canupp has been deemed a candidate for a follow-up tele-health visit to limit community exposure during the Covid-19 pandemic. I spoke with the patient via phone to ensure availability of phone/video source, confirm preferred email & phone number, and discuss instructions and expectations.  I reminded Naela L Boran to be prepared with any vital sign and/or heart rhythm information that could potentially be obtained via home monitoring, at the time of her visit. I reminded CORALEIGH SHEERAN to expect a phone call prior to  her visit.  Lucienne Minks 03/24/2019 9:19 AM   INSTRUCTIONS FOR DOWNLOADING THE MYCHART APP TO SMARTPHONE  - The patient must first make sure to have activated MyChart and know their login information - If Apple, go to [employer] and type in MyChart in the search bar and download the app. If Android, ask patient to go to [employer] and type in [place] in the search bar and download the app. The app is free but as with any other app downloads, their phone may require them to verify saved payment information or Apple/Android  password.  - The patient will need to then log into the app with their MyChart username and password, and select  as their healthcare provider to link the account. When it is time for your visit, go to the MyChart app, find appointments, and click Begin Video Visit. Be sure to Select Allow for your device to access the Microphone and Camera for your visit. You will then be connected, and your provider will be with you shortly.  **If they have any issues connecting, or need assistance please contact MyChart service desk [phone])**  **If using a computer, in order to ensure the best quality for their visit they will need to use either of the following Internet Browsers: [employer], or Google Chrome**  IF USING DOXIMITY or DOXY.ME - The patient will receive a link just prior to their visit by text.     FULL LENGTH CONSENT FOR TELE-HEALTH VISIT   I hereby voluntarily request, consent and authorize [facility] and its employed or contracted physicians, physician assistants, nurse practitioners or other licensed health care professionals (the Practitioner), to provide me with telemedicine health care services (the "Services") as deemed necessary by the treating Practitioner. I acknowledge and consent to receive the Services by the Practitioner via telemedicine. I understand that the telemedicine visit will involve communicating with the Practitioner through live audiovisual communication technology and the disclosure of certain medical information by electronic transmission. I acknowledge that I have been given the opportunity to request an in-person assessment or other available alternative prior to the telemedicine visit and am voluntarily participating in the telemedicine visit.  I understand that I have the right to withhold or withdraw my consent to the use of telemedicine in the course of my care at any time, without affecting my right to future care or treatment,  and that the Practitioner or I may terminate the telemedicine visit at any time. I understand that I have the right to inspect all information obtained and/or recorded in the course of the telemedicine visit and may receive copies of available information for a reasonable fee.  I understand that some of the potential risks of receiving the Services via telemedicine include:  Marland Kitchen Delay or interruption in medical evaluation due to technological equipment failure or disruption; . Information transmitted may not be sufficient (e.g. poor resolution of images) to allow for appropriate medical decision making by the Practitioner; and/or  . In rare instances, security protocols could fail, causing a breach of personal health information.  Furthermore, I acknowledge that it is my responsibility to provide information about my medical history, conditions and care that is complete and accurate to the best of my ability. I acknowledge that Practitioner's advice, recommendations, and/or decision may be based on factors not within their control, such as incomplete or inaccurate data provided by me or distortions of diagnostic images or specimens that may result from electronic transmissions. I understand  that the practice of medicine is not an exact science and that Practitioner makes no warranties or guarantees regarding treatment outcomes. I acknowledge that I will receive a copy of this consent concurrently upon execution via email to the email address I last provided but may also request a printed copy by calling the office of [facility].    I understand that my insurance will be billed for this visit.   I have read or had this consent read to me. . I understand the contents of this consent, which adequately explains the benefits and risks of the Services being provided via telemedicine.  . I have been provided ample opportunity to ask questions regarding this consent and the Services and have had my questions  answered to my satisfaction. . I give my informed consent for the services to be provided through the use of telemedicine in my medical care  By participating in this telemedicine visit I agree to the above.

## 2019-03-24 NOTE — Telephone Encounter (Signed)
Spoke with the pt. Pt has already scheduled a telehealth with Dr.Gollan for 03/31/19. Pt sts that she is having "pounding in her chest" especially when she tries to move around. Pt denies any other symptoms. Pt denies chest pian, sob, swelling, pre-syncope/syncope,or feeling as though her heart is racing. She was prescribed metoprolol 12.5mg  twice daily but she has not been taking it. She does have it on hand. She does not monitor her BP or HR. Adv the pt to go ahead and take 12.5mg  of metoprolol. If doing that helps she should take the 12.5mg  bid as prescribed. She  Does not have a BP monitor at home, but she can ask her daughter who is a nurse to monitor her BP and HR every couple days and keep a record to provide to Dr. Rockey Situ at her upcoming appt. Pt verbalized understanding and voiced appreciation for the call.

## 2019-03-24 NOTE — Telephone Encounter (Signed)
Pt c/o Shortness Of Breath: STAT if SOB developed within the last 24 hours or pt is noticeably SOB on the phone  1. Are you currently SOB (can you hear that pt is SOB on the phone)? Not bad at the moment  2. How long have you been experiencing SOB? Today noticed it a bit more extreme, after walking to a neighbors and back  3. Are you SOB when sitting or when up moving around? Moving around but after doing very little   4. Are you currently experiencing any other symptoms? Heart is pounding hard in chest

## 2019-03-31 ENCOUNTER — Telehealth: Payer: BLUE CROSS/BLUE SHIELD | Admitting: Cardiovascular Disease

## 2019-04-29 ENCOUNTER — Other Ambulatory Visit: Payer: Self-pay

## 2019-04-29 ENCOUNTER — Ambulatory Visit (INDEPENDENT_AMBULATORY_CARE_PROVIDER_SITE_OTHER): Payer: BLUE CROSS/BLUE SHIELD | Admitting: Family Medicine

## 2019-04-29 ENCOUNTER — Encounter: Payer: Self-pay | Admitting: Family Medicine

## 2019-04-29 VITALS — BP 152/79 | HR 83 | Temp 98.5°F | Resp 16 | Ht 61.0 in | Wt 275.0 lb

## 2019-04-29 DIAGNOSIS — H60391 Other infective otitis externa, right ear: Secondary | ICD-10-CM

## 2019-04-29 MED ORDER — CIPROFLOXACIN-DEXAMETHASONE 0.3-0.1 % OT SUSP: 3.0000 [drp] | Freq: Two times a day (BID) | OTIC | 0 refills | Status: DC

## 2019-04-29 NOTE — Patient Instructions (Addendum)
You have otitis externa, which is infection of ear canal. Sometimes this affects the inner ear or ear drum, but these do not seem affected today  Treatment is Ciprodex (topical antibiotic / steroid ear drops), 3 drops per dose, 2 times a day for 7 days, only inside Right ear, lay on your right side with Right ear facing up to put drops in and stay that way for 10 to 15 min to allow medicine to sink in.  Do not put anything into ear including Q-tips or any liquids. The drainage will come out eventually.  AVOID swimming or submerging ear. Try to avoid getting any water inside ear canal (especially during showering).  You may take Ibuprofen Advil Motrin and or Tylenol every 6 hours as needed for pain.  If you develop significant pain, loss of hearing, fevers/chills, spreading redness on skin or new symptoms, please return sooner.  Please schedule a Follow-up Appointment to: Return in about 1 week (around 05/06/2019), or if symptoms worsen or fail to improve, for otitis externa.  If you have any other questions or concerns, please feel free to call the office or send a message through [facility]. You may also schedule an earlier appointment if necessary.  Additionally, you may be receiving a survey about your experience at our office within a few days to 1 week by e-mail or mail. We value your feedback.  [name], DO [place]

## 2019-04-29 NOTE — Progress Notes (Signed)
Subjective:    Patient ID: Denise Glass, female    DOB: Aug 20, 1956, 63 y.o.   MRN: [id number]  Denise Glass is a 63 y.o. female presenting on 04/29/2019 for Ear Pain (light headache, pain radiates down neck, cough and SOB may be weight related as per patients ) and Sore Throat (onset today)  PCP is [name], AGPCNP-BC - I am currently covering during her maternity leave.   HPI  RIGHT EAR OTITIS EXTERNA Reports symptoms onset within past  1 week with R ear pain, even to touch on outside of ear, she thinks she scratched her ear and has had pain since. She felt "fever" in her ear initially now that has improved, she denies generalized fever or other associated symptoms. Admits some pain radiating on R side of neck due to ear pain and into throat now today felt some soreness there with this, thinks it is from the ear. - Admits occasional short of breath and cough but says this is not new, and unrelated to this complaint - Denies any symptoms of fever chills, sweats, nausea vomiting abdominal pain diarrhea, change of taste in mouth, chest pain dyspnea  Depression screen [facility] 2/9 04/29/2019 09/25/2018 02/26/2018  Decreased Interest 3 3 2   Down, Depressed, Hopeless 3 3 1   PHQ - 2 Score 6 6 3   Altered sleeping 3 2 3   Tired, decreased energy 3 3 3   Change in appetite 3 3 3   Feeling bad or failure about yourself  3 0 3  Trouble concentrating 2 3 0  Moving slowly or fidgety/restless 0 0 0  Suicidal thoughts 0 2 0  PHQ-9 Score 20 19 15   Difficult doing work/chores Extremely dIfficult Not difficult at all Somewhat difficult   GAD 7 : Generalized Anxiety Score 04/29/2019 09/25/2018 06/05/2017 04/05/2017  Nervous, Anxious, on Edge 0 3 0 1  Control/stop worrying 0 3 0 3  Worry too much - different things 0 3 0 3  Trouble relaxing 0 0 1 0  Restless 0 0 0 0  Easily annoyed or irritable 0 0 0 0  Afraid - awful might happen 1 0 0 0  Total GAD 7 Score 1 9 1 7   Anxiety Difficulty Very difficult  Not difficult at all - Not difficult at all     Social History   Tobacco Use  . Smoking status: Never Smoker  . Smokeless tobacco: Never Used  Substance Use Topics  . Alcohol use: No  . Drug use: No    Review of Systems Per HPI unless specifically indicated above     Objective:    BP (!) 152/79   Pulse 83   Temp 98.5 F (36.9 C) (Oral)   Resp 16   Ht 5\' 1"  (1.549 m)   Wt 275 lb (124.7 kg)   BMI 51.96 kg/m   Wt Readings from Last 3 Encounters:  04/29/19 275 lb (124.7 kg)  08/23/18 259 lb 12.8 oz (117.8 kg)  07/10/18 256 lb 3.2 oz (116.2 kg)    Physical Exam Vitals signs and nursing note reviewed.  Constitutional:      General: She is not in acute distress.    Appearance: She is well-developed. She is not diaphoretic.     Comments: Well-appearing, comfortable, cooperative  HENT:     Head: Normocephalic and atraumatic.     Ears:     Comments: Right Ear TM normal, with some erythema and edema within external ear canal. Tender to palpation over tragus.  Not over mastoid. No sinus tenderness.  Left TM normal no abnormality. Eyes:     General:        Right eye: No discharge.        Left eye: No discharge.     Conjunctiva/sclera: Conjunctivae normal.  Neck:     Musculoskeletal: Normal range of motion and neck supple.     Thyroid: No thyromegaly.  Cardiovascular:     Rate and Rhythm: Normal rate and regular rhythm.     Heart sounds: Normal heart sounds. No murmur.  Pulmonary:     Effort: Pulmonary effort is normal. No respiratory distress.     Breath sounds: Normal breath sounds. No wheezing or rales.  Musculoskeletal: Normal range of motion.  Lymphadenopathy:     Cervical: No cervical adenopathy.  Skin:    General: Skin is warm and dry.     Findings: No erythema or rash.  Neurological:     Mental Status: She is alert and oriented to person, place, and time.  Psychiatric:        Behavior: Behavior normal.     Comments: Well groomed, good eye contact, normal  speech and thoughts        Assessment & Plan:   Problem List Items Addressed This Visit    None    Visit Diagnoses    Other infective acute otitis externa of right ear    -  Primary   Relevant Medications   ciprofloxacin-dexamethasone (CIPRODEX) OTIC suspension      Clinically consistent with R ear otitis externa with notable erythema and edema within canal some mild debris in ext ear canal. TM is normal no AOM. Only possible inciting injury was scratching external ear. Other symptoms seems related directly to radiating pain from this location No systemic infection is apparent Respiratory symptoms are not new and not related at this time.   Plan: 1. Start Ciprodex otic antibiotic/steroid drops Left ear 4 drops BID x 7 days 2. Start Ibuprofen / Tylenol PRN pain 3. Avoid water in ear, no Q-tips. 4. F/u within 1-2 weeks ear re-check if not improve 10 days ear re-check if not resolved   Meds ordered this encounter  Medications  . ciprofloxacin-dexamethasone (CIPRODEX) OTIC suspension    Sig: Place 3 drops into the right ear 2 (two) times daily.    Dispense:  7.5 mL    Refill:  0      Follow up plan: Return in about 1 week (around 05/06/2019), or if symptoms worsen or fail to improve, for otitis externa.  [name], [facility] 04/29/2019, 4:30 PM

## 2019-04-30 ENCOUNTER — Other Ambulatory Visit: Payer: Self-pay | Admitting: Nurse Practitioner

## 2019-04-30 DIAGNOSIS — H60391 Other infective otitis externa, right ear: Secondary | ICD-10-CM

## 2019-04-30 MED ORDER — NEOMYCIN-POLYMYXIN-HC 3.5-10000-1 OT SOLN: 4.0000 [drp] | Freq: Three times a day (TID) | OTIC | 0 refills | Status: DC

## 2019-04-30 NOTE — Telephone Encounter (Signed)
Patient advised.

## 2019-04-30 NOTE — Telephone Encounter (Signed)
Changed to Neomycin Polymyxin Hydrocortisone Otic Corticosporin ear drops, 4 drops 3 times daily R ear, no charge$  [name], DO [facility] 04/30/2019, 9:31 AM

## 2019-04-30 NOTE — Telephone Encounter (Signed)
Medicine called in was Cipro-Dex Otic ear drops.  Can you call pharmacy and find out what an alternative ear drop antibiotic that is affordable for her?  Oxflacacin may be an option? I preferred one with steroid medicine in it as well.  [name], [facility] 04/30/2019, 8:52 AM

## 2019-04-30 NOTE — Telephone Encounter (Signed)
Pt. Said that the medication that was called in was over $200.00  Wanted to know if you would call in something else

## 2019-05-15 ENCOUNTER — Ambulatory Visit (INDEPENDENT_AMBULATORY_CARE_PROVIDER_SITE_OTHER): Payer: BLUE CROSS/BLUE SHIELD | Admitting: Nurse Practitioner

## 2019-05-15 ENCOUNTER — Other Ambulatory Visit: Payer: Self-pay

## 2019-05-15 ENCOUNTER — Encounter: Payer: Self-pay | Admitting: Nurse Practitioner

## 2019-05-15 VITALS — BP 138/82 | HR 75

## 2019-05-15 DIAGNOSIS — H9311 Tinnitus, right ear: Secondary | ICD-10-CM

## 2019-05-15 DIAGNOSIS — B354 Tinea corporis: Secondary | ICD-10-CM | POA: Diagnosis not present

## 2019-05-15 DIAGNOSIS — F5104 Psychophysiologic insomnia: Secondary | ICD-10-CM

## 2019-05-15 DIAGNOSIS — R239 Unspecified skin changes: Secondary | ICD-10-CM | POA: Diagnosis not present

## 2019-05-15 MED ORDER — ZOLPIDEM TARTRATE 10 MG PO TABS: 10.0000 mg | ORAL_TABLET | Freq: Every evening | ORAL | 2 refills | Status: DC | PRN

## 2019-05-15 MED ORDER — CLOTRIMAZOLE-BETAMETHASONE 1-0.05 % EX CREA: 1.0000 "application " | TOPICAL_CREAM | Freq: Two times a day (BID) | CUTANEOUS | 0 refills | Status: AC

## 2019-05-15 NOTE — Patient Instructions (Addendum)
Denise Glass,   Thank you for coming in to clinic today.  1. Avoid any ear drops in RIGHT ear at this time. 2. There may be fluid behind your ear drum that is not resolved from your infection.  There does not seem to be any active infection. 3. START flonase one spray in each nostril twice daily for at least 2 weeks. 4. Referral to ENT They will call you to schedule.  5. START Lotrisone ointment twice daily for your breast rash.   - USE barrier cream on your skin now and in future to protect it from moisture.  Please schedule a follow-up appointment with [name], AGNP. Return if symptoms worsen or fail to improve.  If you have any other questions or concerns, please feel free to call the clinic or send a message through [facility]. You may also schedule an earlier appointment if necessary.  You will receive a survey after today's visit either digitally by e-mail or paper by [employer]. Your experiences and feedback matter to [place].  Please respond so we know how we are doing as we provide care for you.   [name], DNP, AGNP-BC Adult Gerontology Nurse Practitioner [place]

## 2019-05-15 NOTE — Progress Notes (Signed)
Subjective:    Patient ID: Denise Glass, female    DOB: [birth year], 63 y.o.   MRN: [id number]  Denise Glass is a 63 y.o. female presenting on 05/15/2019 for Tinnitus (Constant  Rt ear ringing x 1.5 week. Pt state the ringing started after treatment for infection. )  HPI Tinnitus Patient has had tinnitus for about 10 days and presents for evaluation.  Tinnitus began about 1 week after treatment of otitis externa by Dr. Parks Ranger on 04/29/2019 with cortisporin otic.  Patient states she feels like she has lost about 1/2 hearing in RIGHT ear.  Had dizziness once when walking to her kitchen.    Insomnia Patient uses Ambien 10 mg about 4 nights per week for sleep. She continues to have good effect with 10 mg and no effect with 5 mg.  Moisture rash under RIGHT breast RIGHT under breast rash.  Mild cracking now present.  Patient has used powder under her breast, then started using neosporin over last 2-3 days after cracking appeared.  She does not usually wear a bra when at home.   Social History   Tobacco Use  . Smoking status: Never Smoker  . Smokeless tobacco: Never Used  Substance Use Topics  . Alcohol use: No  . Drug use: No    Review of Systems Per HPI unless specifically indicated above     Objective:    BP 138/82 (BP Location: Left Arm)   Pulse 75   SpO2 97%   Wt Readings from Last 3 Encounters:  04/29/19 275 lb (124.7 kg)  08/23/18 259 lb 12.8 oz (117.8 kg)  07/10/18 256 lb 3.2 oz (116.2 kg)    Physical Exam Vitals signs reviewed.  Constitutional:      General: She is not in acute distress.    Appearance: She is well-developed.  HENT:     Head: Normocephalic and atraumatic.     Right Ear: Tympanic membrane, ear canal and external ear normal.     Left Ear: Hearing, tympanic membrane, ear canal and external ear normal.     Ears:     Weber exam findings: lateralizes right.    Right Rinne: AC > BC.    Left Rinne: AC > BC.    Nose: Nose normal.   Mouth/Throat:     Lips: Pink.     Mouth: Mucous membranes are moist.     Pharynx: Oropharynx is clear.  Cardiovascular:     Rate and Rhythm: Normal rate and regular rhythm.     Pulses:          Radial pulses are 2+ on the right side and 2+ on the left side.       Posterior tibial pulses are 1+ on the right side and 1+ on the left side.     Heart sounds: Normal heart sounds, S1 normal and S2 normal.  Pulmonary:     Effort: Pulmonary effort is normal. No respiratory distress.     Breath sounds: Normal breath sounds and air entry.  Chest:    Musculoskeletal:     Right lower leg: No edema.     Left lower leg: No edema.  Skin:    General: Skin is warm and dry.     Capillary Refill: Capillary refill takes less than 2 seconds.  Neurological:     Mental Status: She is alert and oriented to person, place, and time.  Psychiatric:        Attention and Perception: Attention normal.  Mood and Affect: Mood and affect normal.        Behavior: Behavior normal. Behavior is cooperative.     Results for orders placed or performed during the hospital encounter of 11/12/18  C-reactive protein  Result Value Ref Range   CRP 1.4 (H) <1.0 mg/dL  Sedimentation rate  Result Value Ref Range   Sed Rate 5 0 - 30 mm/hr  Platelet count  Result Value Ref Range   Platelets 325 150 - 400 K/uL      Assessment & Plan:   Problem List Items Addressed This Visit      Other   Insomnia Stable, but ongoing symptoms.  Patient treats with ambien 10 mg 4 nights per week.  Reviewed high dose with patient, requests to continue despite known risks.  Refill provided.  Follow-up 3-6 months.   Relevant Medications   zolpidem (AMBIEN) 10 MG tablet    Other Visit Diagnoses    Tinnitus of right ear    -  Primary Unknown cause, but could include medication damage to inner ear, effusion, infection.  TM and exam is normal.  Plan: 1. Referral ENT 2. Avoid placing any solutions or objects into ear at this time.  3. Follow-up prn.   Relevant Orders   Ambulatory referral to ENT   Alteration in skin integrity due to moisture     Moderately severe with epidermis skin breakdown.  Fungal and inflammatory processes present.  Plan: 1. START lotrisone ointment bid x 7-10 days. 2. Encouraged patient to keep bra on, use cloths to wick moisture. 3. Avoid use of powder and neosporin at this time. 4. Follow-up prn.   Relevant Medications   clotrimazole-betamethasone (LOTRISONE) cream   Tinea corporis       Relevant Medications   clotrimazole-betamethasone (LOTRISONE) cream      Meds ordered this encounter  Medications  . zolpidem (AMBIEN) 10 MG tablet    Sig: Take 1 tablet (10 mg total) by mouth at bedtime as needed for sleep.    Dispense:  30 tablet    Refill:  2    Not to exceed 3 additional fills before 03/24/2019.    Order Specific Question:   Supervising Provider    Answer:   Olin Hauser [[id number]]  . clotrimazole-betamethasone (LOTRISONE) cream    Sig: Apply 1 application topically 2 (two) times daily for 7 days.    Dispense:  30 g    Refill:  0    Order Specific Question:   Supervising Provider    Answer:   Olin Hauser [[id number]]    Follow up plan: Return if symptoms worsen or fail to improve.  [name], DNP, AGPCNP-BC Adult Gerontology Primary Care Nurse Practitioner [facility] 05/15/2019, 3:03 PM

## 2019-05-16 ENCOUNTER — Encounter: Payer: Self-pay | Admitting: Nurse Practitioner

## 2019-06-10 ENCOUNTER — Telehealth: Payer: Self-pay

## 2019-06-10 NOTE — Telephone Encounter (Signed)
The pt called complaining of chest pain that she notice after walking up the stair yesterday at her sons house. She states when she got to the top of the stairs her heart was pounding and it was difficult to catch her breath. She states the symptoms have improved some since she rested, but admits that the chest pain is still present. She state it in the center of her chest and worsen with deep breathing. She was requesting to come in the office for a EKG today. I recommended that the patient be seen at Urgent Care or Emergency Room so they can do a cardiac work up. The pt verbalize understanding, no questions or concerns.

## 2019-06-10 NOTE — Telephone Encounter (Signed)
Advice given for urgent evaluation is best.  No additional action needed.

## 2019-06-19 ENCOUNTER — Other Ambulatory Visit: Payer: Self-pay | Admitting: Nurse Practitioner

## 2019-06-19 DIAGNOSIS — N3281 Overactive bladder: Secondary | ICD-10-CM

## 2019-06-19 DIAGNOSIS — N3942 Incontinence without sensory awareness: Secondary | ICD-10-CM

## 2019-06-19 DIAGNOSIS — R350 Frequency of micturition: Secondary | ICD-10-CM

## 2019-06-30 ENCOUNTER — Other Ambulatory Visit: Payer: Self-pay | Admitting: *Deleted

## 2019-06-30 MED ORDER — METOPROLOL TARTRATE 25 MG PO TABS: 12.5000 mg | ORAL_TABLET | Freq: Two times a day (BID) | ORAL | 0 refills | Status: DC

## 2019-07-01 ENCOUNTER — Other Ambulatory Visit: Payer: Self-pay

## 2019-07-01 ENCOUNTER — Emergency Department
Admission: EM | Admit: 2019-07-01 | Discharge: 2019-07-01 | Disposition: A | Discharge status: Home / Self Care | Payer: BLUE CROSS/BLUE SHIELD | Attending: Emergency Medicine | Admitting: Emergency Medicine

## 2019-07-01 ENCOUNTER — Emergency Department: Payer: BLUE CROSS/BLUE SHIELD

## 2019-07-01 ENCOUNTER — Encounter: Payer: Self-pay | Admitting: *Deleted

## 2019-07-01 DIAGNOSIS — Z79899 Other long term (current) drug therapy: Secondary | ICD-10-CM | POA: Insufficient documentation

## 2019-07-01 DIAGNOSIS — R0789 Other chest pain: Secondary | ICD-10-CM | POA: Diagnosis not present

## 2019-07-01 DIAGNOSIS — R079 Chest pain, unspecified: Secondary | ICD-10-CM | POA: Diagnosis present

## 2019-07-01 DIAGNOSIS — I10 Essential (primary) hypertension: Secondary | ICD-10-CM | POA: Diagnosis not present

## 2019-07-01 DIAGNOSIS — E039 Hypothyroidism, unspecified: Secondary | ICD-10-CM | POA: Insufficient documentation

## 2019-07-01 LAB — TROPONIN I (HIGH SENSITIVITY)
Troponin I (High Sensitivity): 5 ng/L (ref <18)
Troponin I (High Sensitivity): 6 ng/L (ref <18)

## 2019-07-01 LAB — BASIC METABOLIC PANEL
Anion gap: 11 (ref 5–15)
BUN: 9 mg/dL (ref 8–23)
CO2: 22 mmol/L (ref 22–32)
Calcium: 9.5 mg/dL (ref 8.9–10.3)
Chloride: 104 mmol/L (ref 98–111)
Creatinine, Ser: 0.91 mg/dL (ref 0.44–1.00)
GFR calc Af Amer: >60 mL/min (ref >60)
GFR calc non Af Amer: >60 mL/min (ref >60)
Glucose, Bld: 112 mg/dL — ABNORMAL HIGH (ref 70–99)
Potassium: 4 mmol/L (ref 3.5–5.1)
Sodium: 137 mmol/L (ref 135–145)

## 2019-07-01 LAB — CBC
HCT: 44.3 % (ref 36.0–46.0)
Hemoglobin: 14.7 g/dL (ref 12.0–15.0)
MCH: 29.2 pg (ref 26.0–34.0)
MCHC: 33.2 g/dL (ref 30.0–36.0)
MCV: 87.9 fL (ref 80.0–100.0)
Platelets: 314 10*3/uL (ref 150–400)
RBC: 5.04 MIL/uL (ref 3.87–5.11)
RDW: 14.4 % (ref 11.5–15.5)
WBC: 9.9 10*3/uL (ref 4.0–10.5)
nRBC: 0 % (ref 0.0–0.2)

## 2019-07-01 NOTE — ED Notes (Signed)
PT is very tender to touch of arm or upper left chest

## 2019-07-01 NOTE — ED Notes (Signed)
This RN answered call light. Upon walking into room pt states "unhook me, I'm going home unhook me". MD made aware. PT awaiting either d/c paper work

## 2019-07-01 NOTE — ED Triage Notes (Signed)
Pt to ED reporting intermittent chest pain for the past week that has changed into left sided pain with radiation into her left arm and back. No SOB but pain increases with a deep breath. Pt denies dizziness, cough, lightheadedness or nausea. No cardiac hx.

## 2019-07-01 NOTE — ED Provider Notes (Signed)
[facility] Emergency Department Provider Note  ____________________________________________  Time seen: Approximately 6:26 PM  I have reviewed the triage vital signs and the nursing notes.   HISTORY  Chief Complaint Chest Pain    HPI CLEMETINE ODENS is a 63 y.o. female with a history of GERD, DJD, headache, hypertension who comes the ED complaining of chest pain for the past week, intermittent, worse with movement.  Not associated with any shortness of breath diaphoresis or vomiting.  Feels it in the left upper chest and left shoulder.  Not exertional, not pleuritic.  No fevers chills cough body aches or sick contacts.      Past Medical History:  Diagnosis Date  . Complication of anesthesia    pt reports her appetite takes 2-3 weeks to come back after anesthesia.  . Depression   . DJD (degenerative joint disease)   . GERD (gastroesophageal reflux disease)   . Headache 2016   cluster head aches, Head aches have subsided.  . Hypertension   . Hypothyroidism   . Insomnia   . Mitral valve prolapse 2003   Followed by Dr. Clayborn Bigness (prn)  . PONV (postoperative nausea and vomiting) 2002   With BTL  . Urine frequency      Patient Active Problem List   Diagnosis Date Noted  . Atrial tachycardia ([place]) 04/29/2018  . Morbid obesity ([place]) 04/29/2018  . Mixed hyperlipidemia 04/29/2018  . BMI 45.0-49.9, adult ([facility]) 01/11/2018  . Status post total replacement of hip 08/22/2017  . Anxiety 03/13/2017  . Moderate episode of recurrent major depressive disorder ([place]) 03/13/2017  . Headache syndrome 03/24/2016  . Trochanteric bursitis of right hip 01/04/2016  . Arthritis of foot 11/28/2015  . Trigger middle finger of left hand 07/20/2015  . SOB (shortness of breath) on exertion 02/01/2015  . Postmenopausal atrophic vaginitis 06/02/2014  . Sleep disturbance 04/06/2014  . GERD (gastroesophageal reflux disease) 09/23/2012  . Insomnia 09/23/2012     Past  Surgical History:  Procedure Laterality Date  . BREAST BIOPSY  2001   Calcification - bx done by Dr. Raylene Everts  . BREAST SURGERY  2001   biopsy  . COLONOSCOPY WITH PROPOFOL N/A 11/13/2016   Procedure: COLONOSCOPY WITH PROPOFOL;  Surgeon: Manya Silvas, MD;  Location: [facility] ENDOSCOPY;  Service: Endoscopy;  Laterality: N/A;  . JOINT REPLACEMENT  2008   left and right knee replaced  . KNEE ARTHROPLASTY  2007   Right Knee  . KNEE ARTHROPLASTY  2008   Left Knee  . left total knee replacement    . right total knee    . TOTAL HIP ARTHROPLASTY Right 08/22/2017   Procedure: TOTAL HIP ARTHROPLASTY;  Surgeon: Dereck Leep, MD;  Location: [facility] ORS;  Service: Orthopedics;  Laterality: Right;  . TUBAL LIGATION       Prior to Admission medications   Medication Sig Start Date End Date Taking? Authorizing Provider  ibuprofen (ADVIL,MOTRIN) 200 MG tablet Take 800 mg by mouth every 6 (six) hours as needed for headache or moderate pain.    [provider]  levothyroxine (SYNTHROID, LEVOTHROID) 50 MCG tablet TAKE 1 TABLET BY MOUTH EVERY DAY 07/30/18   Mikey College, NP  magic mouthwash SOLN 1 Part diphenhydramine HCL 1 Part Maalox 1 Part Nystatin  Swish, gargle, and swallow.  Take 4 times daily for thrush. Patient not taking: Reported on 05/15/2019 11/26/18   Mikey College, NP  metoprolol tartrate (LOPRESSOR) 25 MG tablet Take 0.5 tablets (12.5  mg total) by mouth 2 (two) times daily. 06/30/19   Minna Merritts, MD  neomycin-polymyxin-hydrocortisone (CORTISPORIN) OTIC solution Place 4 drops into the right ear 3 (three) times daily. Patient not taking: Reported on 05/15/2019 04/30/19   Olin Hauser, DO  omeprazole (PRILOSEC OTC) 20 MG tablet Take 20 mg by mouth at bedtime.    [provider]  omeprazole (PRILOSEC) 20 MG capsule Take by mouth.    [provider]  oxybutynin (DITROPAN XL) 15 MG 24 hr tablet TAKE 1 TABLET BY MOUTH EVERYDAY AT BEDTIME  06/20/19   Mikey College, NP  predniSONE (DELTASONE) 10 MG tablet  11/12/18   [provider]  sertraline (ZOLOFT) 100 MG tablet Take 1 tablet (100 mg total) by mouth daily. 09/25/18   Mikey College, NP  zolpidem (AMBIEN) 10 MG tablet Take 1 tablet (10 mg total) by mouth at bedtime as needed for sleep. 05/15/19   Mikey College, NP     Allergies Prednazoline and Sulfa antibiotics   Family History  Problem Relation Age of Onset  . Heart disease Father   . COPD Father   . Cancer Father        throat  . COPD Mother   . Heart disease Mother   . Stroke Mother   . Healthy Brother   . Healthy Son   . Drug abuse Brother     Social History Social History   Tobacco Use  . Smoking status: Never Smoker  . Smokeless tobacco: Never Used  Substance Use Topics  . Alcohol use: No  . Drug use: No    Review of Systems  Constitutional:   No fever or chills.  ENT:   No sore throat. No rhinorrhea. Cardiovascular: Positive as above chest pain without syncope. Respiratory:   No dyspnea or cough. Gastrointestinal:   Negative for abdominal pain, vomiting and diarrhea.  Musculoskeletal: Positive left shoulder pain All other systems reviewed and are negative except as documented above in ROS and HPI.  ____________________________________________   PHYSICAL EXAM:  VITAL SIGNS: ED Triage Vitals  Enc Vitals Group     BP 07/01/19 1626 (!) 163/96     Pulse Rate 07/01/19 1626 79     Resp 07/01/19 1626 16     Temp 07/01/19 1626 98.9 F (37.2 C)     Temp Source 07/01/19 1626 Oral     SpO2 07/01/19 1626 95 %     Weight 07/01/19 1623 270 lb (122.5 kg)     Height 07/01/19 1623 5\' 2"  (1.575 m)     Head Circumference --      Peak Flow --      Pain Score 07/01/19 1623 7     Pain Loc --      Pain Edu? --      Excl. in [place]? --     Vital signs reviewed, nursing assessments reviewed.   Constitutional:   Alert and oriented. Non-toxic appearance. Eyes:    Conjunctivae are normal. EOMI. PERRL. ENT      Head:   Normocephalic and atraumatic.        Neck:   No meningismus. Full ROM. Hematological/Lymphatic/Immunilogical:   No cervical lymphadenopathy. Cardiovascular:   RRR. Symmetric bilateral radial and DP pulses.  No murmurs. Cap refill less than 2 seconds. Respiratory:   Normal respiratory effort without tachypnea/retractions. Breath sounds are clear and equal bilaterally. No wheezes/rales/rhonchi. Gastrointestinal:   Soft and nontender. Non distended. There is no CVA tenderness.  No  rebound, rigidity, or guarding.  Musculoskeletal:   Normal range of motion in all extremities.  There is chest wall tenderness over the upper ribs on the left anteriorly as well as over the rhomboids and trapezius on the left.  Also tenderness about the left shoulder.  It is nonfocal, no bony point tenderness.  She has intact range of motion.  No inflammatory skin or soft tissue changes. Neurologic:   Normal speech and language.  Motor grossly intact. No acute focal neurologic deficits are appreciated.  Skin:    Skin is warm, dry and intact. No rash noted.  No petechiae, purpura, or bullae.  ____________________________________________    LABS (pertinent positives/negatives) (all labs ordered are listed, but only abnormal results are displayed) Labs Reviewed  BASIC METABOLIC PANEL - Abnormal; Notable for the following components:      Result Value   Glucose, Bld 112 (*)    All other components within normal limits  CBC  TROPONIN I (HIGH SENSITIVITY)  TROPONIN I (HIGH SENSITIVITY)   ____________________________________________   EKG  Interpreted by me Normal sinus rhythm, rate of 74, normal axis, intervals, qrs, st segments, and t waves.   ____________________________________________    [id number]  Dg Chest 2 View  Result Date: 07/01/2019 CLINICAL DATA:  Chest tightness EXAM: CHEST - 2 VIEW COMPARISON:  January 17, 2015 FINDINGS: The heart size and  mediastinal contours are within normal limits. Both lungs are clear. The visualized skeletal structures are unremarkable. IMPRESSION: No active cardiopulmonary disease. Electronically Signed   By: Abelardo Diesel M.D.   On: 07/01/2019 17:09    ____________________________________________   PROCEDURES Procedures  ____________________________________________    CLINICAL IMPRESSION / ASSESSMENT AND PLAN / ED COURSE  Medications ordered in the ED: Medications - No data to display  Pertinent labs & imaging results that were available during my care of the patient were reviewed by me and considered in my medical decision making (see chart for details).  LATEDRA STEERMAN was evaluated in Emergency Department on 07/01/2019 for the symptoms described in the history of present illness. She was evaluated in the context of the global COVID-19 pandemic, which necessitated consideration that the patient might be at risk for infection with the SARS-CoV-2 virus that causes COVID-19. Institutional protocols and algorithms that pertain to the evaluation of patients at risk for COVID-19 are in a state of rapid change based on information released by regulatory bodies including the CDC and federal and state organizations. These policies and algorithms were followed during the patient's care in the ED.   Patient presents with musculoskeletal chest wall pain, normal vital signs, unremarkable EKG.  First troponin is negative as is chest x-ray and other labs.  Due to her obesity and hypertension, will get a second troponin for further stratification but symptoms are noncardiac and no further cardiac work-up is needed.  She can follow-up with primary care, take NSAIDs, heat therapy.  Clinical Course as of Jul 01 1855  Tue Jul 01, 2019  [id number] Patient now insists on being discharged immediately.  I will follow-up her troponin, contact her if it is concerning.  Pain is noncardiac, musculoskeletal, stable for discharge.    [PS]    Clinical Course User Index [PS] Carrie Mew, MD     ____________________________________________   FINAL CLINICAL IMPRESSION(S) / ED DIAGNOSES    Final diagnoses:  Chest wall pain     ED Discharge Orders    None      Portions of this note were generated  with [employer]. Dictation errors may occur despite best attempts at proofreading.   Carrie Mew, MD 07/01/19 [phone]

## 2019-08-20 ENCOUNTER — Other Ambulatory Visit: Payer: Self-pay | Admitting: Nurse Practitioner

## 2019-08-20 DIAGNOSIS — F5104 Psychophysiologic insomnia: Secondary | ICD-10-CM

## 2019-08-25 ENCOUNTER — Ambulatory Visit (INDEPENDENT_AMBULATORY_CARE_PROVIDER_SITE_OTHER): Payer: BLUE CROSS/BLUE SHIELD | Admitting: Family Medicine

## 2019-08-25 ENCOUNTER — Encounter: Payer: Self-pay | Admitting: Family Medicine

## 2019-08-25 ENCOUNTER — Other Ambulatory Visit: Payer: Self-pay

## 2019-08-25 VITALS — BP 139/90 | HR 92 | Temp 98.1°F | Resp 20 | Ht 62.0 in | Wt 276.2 lb

## 2019-08-25 DIAGNOSIS — I10 Essential (primary) hypertension: Secondary | ICD-10-CM

## 2019-08-25 DIAGNOSIS — R251 Tremor, unspecified: Secondary | ICD-10-CM | POA: Diagnosis not present

## 2019-08-25 DIAGNOSIS — Z23 Encounter for immunization: Secondary | ICD-10-CM | POA: Diagnosis not present

## 2019-08-25 DIAGNOSIS — R202 Paresthesia of skin: Secondary | ICD-10-CM | POA: Diagnosis not present

## 2019-08-25 MED ORDER — GABAPENTIN 100 MG PO CAPS: ORAL_CAPSULE | ORAL | 1 refills | Status: DC

## 2019-08-25 MED ORDER — METOPROLOL TARTRATE 25 MG PO TABS: 25.0000 mg | ORAL_TABLET | Freq: Two times a day (BID) | ORAL | 1 refills | Status: DC

## 2019-08-25 NOTE — Progress Notes (Signed)
Subjective:    Patient ID: Denise Glass, female    DOB: 12-11-1955, 63 y.o.   MRN: [id number]  Denise Glass is a 63 y.o. female presenting on 08/25/2019 for Pain (pain in hand/right arm)  Previous PCP Denise Glass, AGPCNP-BC   HPI   Left Hand Tremors / Paresthesia She is right handed. Reports issues today new complaints. She describes Left handed upper extremity symptoms with "nerve tremors" with Left hand only with shaking tremor worse with intention or movement or holding something, seems to last few minutes up to 5-10 min max, can repeat throughout the day but NOT every day. Seems to be worsening worsening over this past 1 week - Additionally admitting some neuropathy symptoms mostly elbow down into hand, seems to be hours in duration, pretty much occurring every day now, more frequent. Describes "neuropathy" as pins and needles or tingling and numbness but NOT painful, 0 out of 10 pain. - She cannot link either of these to some activity or trigger that is known. No new activity or project. - Never had these issues before. Not seen specialist or any treatment - History of Denise Glass's Johnson-Syndrome. She cannot take sulfa antibiotics. She had temporal artery biopsy in past she said that prednisone caused oral ulcers - Denies any weakness, redness swelling, other extremity or skin involvement   Health Maintenance: Due for Flu Shot, will receive today    Depression screen [facility] 2/9 08/25/2019 04/29/2019 09/25/2018  Decreased Interest 2 3 3   Down, Depressed, Hopeless 1 3 3   PHQ - 2 Score 3 6 6   Altered sleeping 3 3 2   Tired, decreased energy 1 3 3   Change in appetite 0 3 3  Feeling bad or failure about yourself  0 3 0  Trouble concentrating 0 2 3  Moving slowly or fidgety/restless 0 0 0  Suicidal thoughts 0 0 2  PHQ-9 Score 7 20 19   Difficult doing work/chores Somewhat difficult Extremely dIfficult Not difficult at all  Some recent data might be hidden    Social History    Tobacco Use  . Smoking status: Never Smoker  . Smokeless tobacco: Never Used  Substance Use Topics  . Alcohol use: No  . Drug use: No    Review of Systems Per HPI unless specifically indicated above     Objective:    BP 139/90 (BP Location: Right Wrist, Patient Position: Sitting, Cuff Size: Normal)   Pulse 92   Temp 98.1 F (36.7 C) (Oral)   Resp 20   Ht 5\' 2"  (1.575 m)   Wt 276 lb 3.2 oz (125.3 kg)   SpO2 97%   BMI 50.52 kg/m   Wt Readings from Last 3 Encounters:  08/25/19 276 lb 3.2 oz (125.3 kg)  07/01/19 270 lb (122.5 kg)  04/29/19 275 lb (124.7 kg)    Physical Exam Vitals signs and nursing note reviewed.  Constitutional:      General: She is not in acute distress.    Appearance: She is well-developed. She is not diaphoretic.     Comments: Well-appearing, comfortable, cooperative  HENT:     Head: Normocephalic and atraumatic.  Eyes:     General:        Right eye: No discharge.        Left eye: No discharge.     Conjunctiva/sclera: Conjunctivae normal.  Cardiovascular:     Rate and Rhythm: Normal rate.  Pulmonary:     Effort: Pulmonary effort is normal.  Skin:  General: Skin is warm and dry.     Findings: No erythema or rash.  Neurological:     Mental Status: She is alert and oriented to person, place, and time.     Cranial Nerves: No cranial nerve deficit.     Sensory: Sensory deficit (reduced sense to light touch palmar and dorsal L hand all 5 fingers, then dorsal forearm only) present.     Motor: No weakness.     Comments: No evidence of resting tremor or involuntary movement.  No significant active movement provoked tremor as well  Mild reduced strength grip  Psychiatric:        Behavior: Behavior normal.     Comments: Well groomed, good eye contact, normal speech and thoughts      Results for orders placed or performed during the hospital encounter of [id number]  Basic metabolic panel  Result Value Ref Range   Sodium 137 135 - 145 mmol/L    Potassium 4.0 3.5 - 5.1 mmol/L   Chloride 104 98 - 111 mmol/L   CO2 22 22 - 32 mmol/L   Glucose, Bld 112 (H) 70 - 99 mg/dL   BUN 9 8 - 23 mg/dL   Creatinine, Ser 0.91 0.44 - 1.00 mg/dL   Calcium 9.5 8.9 - 10.3 mg/dL   GFR calc non Af Amer >60 >60 mL/min   GFR calc Af Amer >60 >60 mL/min   Anion gap 11 5 - 15  CBC  Result Value Ref Range   WBC 9.9 4.0 - 10.5 K/uL   RBC 5.04 3.87 - 5.11 MIL/uL   Hemoglobin 14.7 12.0 - 15.0 g/dL   HCT 44.3 36.0 - 46.0 %   MCV 87.9 80.0 - 100.0 fL   MCH 29.2 26.0 - 34.0 pg   MCHC 33.2 30.0 - 36.0 g/dL   RDW 14.4 11.5 - 15.5 %   Platelets 314 150 - 400 K/uL   nRBC 0.0 0.0 - 0.2 %  Troponin I (High Sensitivity)  Result Value Ref Range   Troponin I (High Sensitivity) 5 <18 ng/L  Troponin I (High Sensitivity)  Result Value Ref Range   Troponin I (High Sensitivity) 6 <18 ng/L      Assessment & Plan:   Problem List Items Addressed This Visit    Essential hypertension   Relevant Medications   metoprolol tartrate (LOPRESSOR) 25 MG tablet    Other Visit Diagnoses    Paresthesia of left arm    -  Primary   Relevant Medications   gabapentin (NEURONTIN) 100 MG capsule   Occasional tremors       Relevant Medications   metoprolol tartrate (LOPRESSOR) 25 MG tablet   Needs flu shot       Relevant Orders   [facility] - Flu Vaccine QUAD 36+ mos PF IM (Fluarix & Fluzone Quad PF) (Completed)      #HTN Increase Metoprolol back to 25mg  BID, previously was on 12.5mg  BID, may help if possible essential tremor  #Tremors/Paresthesia Uncertain exact etiology, focal and persistent now seems some worsening. Considered nerve entrapment possibly at elbow with ulnar and also median nerve given distribution, no significant cervical / neck or shoulder symptoms present. No obvious injury.  - Trial on Gabapentin for current neuropathy symptoms - Increased BB for tremor - Check labs for other diagnostic - Strongly recommended referral to Neurologist, she declines today  until 2021 due to insurance issue will be changing, prefer to see neuro in [facility] or [facility].  Meds ordered this  encounter  Medications  . gabapentin (NEURONTIN) 100 MG capsule    Sig: Start 1 capsule daily, increase by 1 cap every 2-3 days as tolerated up to 3 times a day, or may take 3 at once in evening.    Dispense:  90 capsule    Refill:  1  . metoprolol tartrate (LOPRESSOR) 25 MG tablet    Sig: Take 1 tablet (25 mg total) by mouth 2 (two) times daily.    Dispense:  180 tablet    Refill:  1    Dose increase. Please hold medicine on file until patient ready.     Follow up plan: Return in about 3 months (around 11/25/2019), or if symptoms worsen or fail to improve, for tremors / paresthesia nerve symptoms.   Future labs ordered for 1 week BMET, Mag, TSH, Vitamin B12  [name], DO [facility] 08/25/2019, 1:34 PM

## 2019-08-25 NOTE — Patient Instructions (Addendum)
Thank you for coming to the office today.  For neuropathy Start Gabapentin 100mg  capsules, take at night for 2-3 nights only, and then increase to 2 times a day for a few days, and then may increase to 3 times a day, it may make you drowsy, if helps significantly at night only, then you can increase instead to 3 capsules at night, instead of 3 times a day - In the future if needed, we can significantly increase the dose if tolerated well, some common doses are 300mg  three times a day up to 600mg  three times a day, usually it takes several weeks or months to get to higher doses  For BP and tremors - INCREASE Metoprolol from half pill up to whole pill twice a day, new rx sent.  Recommend Neurology consultation when you are ready. We can order this. Future need nerve conduction testing.  As discussed tremors do not entirely fit parkinsons at this time we can reconsider in future with more testing  Lab testing recommended  DUE for FASTING BLOOD WORK (no food or drink after midnight before the lab appointment, only water or coffee without cream/sugar on the morning of)  SCHEDULE "Lab Only" visit in the morning at the clinic for lab draw within 1-2  WEEKS   - Make sure Lab Only appointment is at about 1 week before your next appointment, so that results will be available  For Lab Results, once available within 2-3 days of blood draw, you can can log in to MyChart online to view your results and a brief explanation. Also, we can discuss results at next follow-up visit.   Please schedule a Follow-up Appointment to: Return in about 3 months (around 11/25/2019), or if symptoms worsen or fail to improve, for tremors / paresthesia nerve symptoms.  If you have any other questions or concerns, please feel free to call the office or send a message through [place]. You may also schedule an earlier appointment if necessary.  Additionally, you may be receiving a survey about your experience at our office  within a few days to 1 week by e-mail or mail. We value your feedback.  [name], DO [facility]

## 2019-08-26 ENCOUNTER — Encounter: Payer: Self-pay | Admitting: Family Medicine

## 2019-10-02 ENCOUNTER — Other Ambulatory Visit: Payer: Self-pay | Admitting: Family Medicine

## 2019-10-02 DIAGNOSIS — B350 Tinea barbae and tinea capitis: Secondary | ICD-10-CM

## 2019-10-07 ENCOUNTER — Other Ambulatory Visit: Payer: Self-pay | Admitting: Nurse Practitioner

## 2019-10-07 DIAGNOSIS — N3281 Overactive bladder: Secondary | ICD-10-CM

## 2019-10-07 DIAGNOSIS — N3942 Incontinence without sensory awareness: Secondary | ICD-10-CM

## 2019-10-07 DIAGNOSIS — R350 Frequency of micturition: Secondary | ICD-10-CM

## 2019-11-14 ENCOUNTER — Ambulatory Visit (INDEPENDENT_AMBULATORY_CARE_PROVIDER_SITE_OTHER): Payer: 59 | Admitting: Family

## 2019-11-14 ENCOUNTER — Encounter: Payer: Self-pay | Admitting: Family

## 2019-11-14 ENCOUNTER — Other Ambulatory Visit: Payer: Self-pay

## 2019-11-14 ENCOUNTER — Ambulatory Visit (INDEPENDENT_AMBULATORY_CARE_PROVIDER_SITE_OTHER): Payer: 59

## 2019-11-14 VITALS — BP 110/70 | HR 79 | Ht 62.0 in | Wt 276.0 lb

## 2019-11-14 DIAGNOSIS — R0600 Dyspnea, unspecified: Secondary | ICD-10-CM | POA: Diagnosis not present

## 2019-11-14 DIAGNOSIS — R002 Palpitations: Secondary | ICD-10-CM

## 2019-11-14 DIAGNOSIS — I341 Nonrheumatic mitral (valve) prolapse: Secondary | ICD-10-CM

## 2019-11-14 DIAGNOSIS — E039 Hypothyroidism, unspecified: Secondary | ICD-10-CM

## 2019-11-14 DIAGNOSIS — R0609 Other forms of dyspnea: Secondary | ICD-10-CM

## 2019-11-14 DIAGNOSIS — I10 Essential (primary) hypertension: Secondary | ICD-10-CM

## 2019-11-14 NOTE — Patient Instructions (Addendum)
Medication Instructions:  No medication changes today.  *If you need a refill on your cardiac medications before your next appointment, please call your pharmacy*  Lab Work: Your physician recommends that you return for lab work today: TSH, BMP, magnesium, CBC  If you have labs (blood work) drawn today and your tests are completely normal, you will receive your results only by: Marland Kitchen MyChart Message (if you have MyChart) OR . A paper copy in the mail If you have any lab test that is abnormal or we need to change your treatment, we will call you to review the results.  Testing/Procedures: Your physician has requested that you have an echocardiogram (after 11/28/19) . Echocardiography is a painless test that uses sound waves to create images of your heart. It provides your doctor with information about the size and shape of your heart and how well your heart's chambers and valves are working. This procedure takes approximately one hour. There are no restrictions for this procedure.  Your physician has recommended that you wear a 14 day Zio monitor (placed in office today). This monitor is a medical device that records the heart's electrical activity. Doctors most often use these monitors to diagnose arrhythmias. Arrhythmias are problems with the speed or rhythm of the heartbeat. The monitor is a small device applied to your chest. You can wear one while you do your normal daily activities. While wearing this monitor if you have any symptoms to push the button and record what you felt. Once you have worn this monitor for the period of time provider prescribed (Usually 14 days), you will return the monitor device in the postage paid box. Once it is returned they will download the data collected and provide [place] with a report which the provider will then review and we will call you with those results. Important tips:  1. Avoid showering during the first 24 hours of wearing the monitor. 2. Avoid excessive  sweating to help maximize wear time. 3. Do not submerge the device, no hot tubs, and no swimming pools. 4. Keep any lotions or oils away from the patch. 5. After 24 hours you may shower with the patch on. Take brief showers with your back facing the shower head.  6. Do not remove patch once it has been placed because that will interrupt data and decrease adhesive wear time. 7. Push the button when you have any symptoms and write down what you were feeling. 8. Once you have completed wearing your monitor, remove and place into box which has postage paid and place in your outgoing mailbox.  9. If for some reason you have misplaced your box then call our office and we can provide another box and/or mail it off for you.          Follow-Up: At [facility], you and your health needs are our priority.  As part of our continuing mission to provide you with exceptional heart care, we have created designated Provider Care Teams.  These Care Teams include your primary Cardiologist (physician) and Advanced Practice Providers (APPs -  Physician Assistants and Nurse Practitioners) who all work together to provide you with the care you need, when you need it.  Your next appointment:   5-6 week(s)  The format for your next appointment:   In Person  Provider:   You may see Ida Rogue, MD or one of the following Advanced Practice Providers on your designated Care Team:    Murray Hodgkins, NP  Christell Faith,  PA-C  Marrianne Mood, PA-C  Other Instructions  Recommend reducing your caffeine intake as this can trigger palpitations. Try purchasing caffeine-free Coke.   We will update your ultrasound of your heart to make sure your mitral valve prolapse to make sure it is stable.   Medications and Palpitations 1. Avoid all over-the-counter antihistamines except Claritin/Loratadine and Zyrtec/Cetrizine. 2. Avoid all combination including cold sinus allergies flu decongestant and sleep  medications 3. You can use Robitussin DM Mucinex and Mucinex DM for cough. 4. can use Tylenol aspirin ibuprofen and naproxen but no combinations such as sleep or sinus.   Palpitations Palpitations are feelings that your heartbeat is not normal. Your heartbeat may feel like it is:  Uneven.  Faster than normal.  Fluttering.  Skipping a beat. This is usually not a serious problem. In some cases, you may need tests to rule out any serious problems. Follow these instructions at home: Pay attention to any changes in your condition. Take these actions to help manage your symptoms: Eating and drinking  Avoid: ? Coffee, tea, soft drinks, and energy drinks. ? Chocolate. ? Alcohol. ? Diet pills. Lifestyle   Try to lower your stress. These things can help you relax: ? Yoga. ? Deep breathing and meditation. ? Exercise. ? Using words and images to create positive thoughts (guided imagery). ? Using your mind to control things in your body (biofeedback).  Do not use drugs.  Get plenty of rest and sleep. Keep a regular bed time. General instructions   Take over-the-counter and prescription medicines only as told by your doctor.  Do not use any products that contain nicotine or tobacco, such as cigarettes and e-cigarettes. If you need help quitting, ask your doctor.  Keep all follow-up visits as told by your doctor. This is important. You may need more tests if palpitations do not go away or get worse. Contact a doctor if:  Your symptoms last more than 24 hours.  Your symptoms occur more often. Get help right away if you:  Have chest pain.  Feel short of breath.  Have a very bad headache.  Feel dizzy.  Pass out (faint). Summary  Palpitations are feelings that your heartbeat is uneven or faster than normal. It may feel like your heart is fluttering or skipping a beat.  Avoid food and drinks that may cause palpitations. These include caffeine, chocolate, and  alcohol.  Try to lower your stress. Do not smoke or use drugs.  Get help right away if you faint or have chest pain, shortness of breath, a severe headache, or dizziness. This information is not intended to replace advice given to you by your health care provider. Make sure you discuss any questions you have with your health care provider. Document Revised: 11/14/2017 Document Reviewed: 11/14/2017 Elsevier Patient Education  [place].   Echocardiogram An echocardiogram is a procedure that uses painless sound waves (ultrasound) to produce an image of the heart. Images from an echocardiogram can provide important information about:  Signs of coronary artery disease (CAD).  Aneurysm detection. An aneurysm is a weak or damaged part of an artery wall that bulges out from the normal force of blood pumping through the body.  Heart size and shape. Changes in the size or shape of the heart can be associated with certain conditions, including heart failure, aneurysm, and CAD.  Heart muscle function.  Heart valve function.  Signs of a past heart attack.  Fluid buildup around the heart.  Thickening of the  heart muscle.  A tumor or infectious growth around the heart valves. Tell a health care provider about:  Any allergies you have.  All medicines you are taking, including vitamins, herbs, eye drops, creams, and over-the-counter medicines.  Any blood disorders you have.  Any surgeries you have had.  Any medical conditions you have.  Whether you are pregnant or may be pregnant. What are the risks? Generally, this is a safe procedure. However, problems may occur, including:  Allergic reaction to dye (contrast) that may be used during the procedure. What happens before the procedure? No specific preparation is needed. You may eat and drink normally. What happens during the procedure?   An IV tube may be inserted into one of your veins.  You may receive contrast through  this tube. A contrast is an injection that improves the quality of the pictures from your heart.  A gel will be applied to your chest.  A wand-like tool (transducer) will be moved over your chest. The gel will help to transmit the sound waves from the transducer.  The sound waves will harmlessly bounce off of your heart to allow the heart images to be captured in real-time motion. The images will be recorded on a computer. The procedure may vary among health care providers and hospitals. What happens after the procedure?  You may return to your normal, everyday life, including diet, activities, and medicines, unless your health care provider tells you not to do that. Summary  An echocardiogram is a procedure that uses painless sound waves (ultrasound) to produce an image of the heart.  Images from an echocardiogram can provide important information about the size and shape of your heart, heart muscle function, heart valve function, and fluid buildup around your heart.  You do not need to do anything to prepare before this procedure. You may eat and drink normally.  After the echocardiogram is completed, you may return to your normal, everyday life, unless your health care provider tells you not to do that. This information is not intended to replace advice given to you by your health care provider. Make sure you discuss any questions you have with your health care provider. Document Revised: 01/23/2019 Document Reviewed: 11/04/2016 Elsevier Patient Education  [place].

## 2019-11-14 NOTE — Progress Notes (Signed)
Office Visit    Patient Name: Denise Glass Date of Encounter: 11/14/2019  Primary Care Provider:  Mikey College, NP (Inactive) Primary Cardiologist:  Ida Rogue, MD Electrophysiologist:  None   Chief Complaint    Denise Glass is a 64 y.o. female with a hx of HTN, tachycardia, palpitations, fatigue, depression/anxiety, shortness of breath, thyroidism, obesity, GERD presents today for palpitations.   Past Medical History    Past Medical History:  Diagnosis Date  . Complication of anesthesia    pt reports her appetite takes 2-3 weeks to come back after anesthesia.  . Depression   . DJD (degenerative joint disease)   . GERD (gastroesophageal reflux disease)   . Headache 2016   cluster head aches, Head aches have subsided.  . Hypertension   . Hypothyroidism   . Insomnia   . Mitral valve prolapse 2003   Followed by Dr. Clayborn Bigness (prn)  . PONV (postoperative nausea and vomiting) 2002   With BTL  . Urine frequency    Past Surgical History:  Procedure Laterality Date  . BREAST BIOPSY  2001   Calcification - bx done by Dr. Raylene Everts  . BREAST SURGERY  2001   biopsy  . COLONOSCOPY WITH PROPOFOL N/A 11/13/2016   Procedure: COLONOSCOPY WITH PROPOFOL;  Surgeon: Manya Silvas, MD;  Location: [facility] ENDOSCOPY;  Service: Endoscopy;  Laterality: N/A;  . JOINT REPLACEMENT  2008   left and right knee replaced  . KNEE ARTHROPLASTY  2007   Right Knee  . KNEE ARTHROPLASTY  2008   Left Knee  . left total knee replacement    . right total knee    . TOTAL HIP ARTHROPLASTY Right 08/22/2017   Procedure: TOTAL HIP ARTHROPLASTY;  Surgeon: Dereck Leep, MD;  Location: [facility] ORS;  Service: Orthopedics;  Laterality: Right;  . TUBAL LIGATION      Allergies  Allergies  Allergen Reactions  . Prednazoline Other (See Comments)  . Sulfa Antibiotics Other (See Comments)    Unknown    History of Present Illness    Denise Glass is a 65 y.o. female with a hx of HTN,  tachycardia, palpitations, fatigue, depression/anxiety, shortness of breath, thyroidism, obesity, GERD  last seen 04/2018 by Dr. Rockey Situ.  Tells me sometimes she gets episodes of rapid heart rates. Has been present over the last 6 months. Lasts 10-15 seconds.   Tells me she gets worn out more quickly than she used to and dyspneic with exertion.  Tells me she knows she has gained weight but she does not feel that it is all related to her recent weight gain.  She drinks multiple diet soda throughout the day.  We discussed the caffeine could be contributory.  She reports she has been on the same dose of levothyroxine from a long time, most recent TSH check appears to be 2 years ago.  We discussed that stress could be contributory and she tells me she has been under more stress recently.  She takes Benadryl daily for congestion, we discussed the Benadryl as a proarrhythmic medication.  We discussed switching to Claritin or Zyrtec.  She tells me she has a history of mitral valve prolapse, but has not had an echocardiogram in a number of years.  Reports no chest pain, pressure, tightness.  Reports no shortness breath at rest.  No edema, orthopnea, PND.  EKGs/Labs/Other Studies Reviewed:   The following studies were reviewed today:   EKG:  EKG is  ordered  today.  The ekg ordered today demonstrates sinus and 79 bpm with nonspecific ST/T wave changes in inferior leads.  Recent Labs: 07/01/2019: BUN 9; Creatinine, Ser 0.91; Hemoglobin 14.7; Platelets 314; Potassium 4.0; Sodium 137  Recent Lipid Panel    Component Value Date/Time   CHOL 238 (H) 05/31/2017 0837   TRIG 156 (H) 05/31/2017 0837   HDL 56 05/31/2017 0837   CHOLHDL 4.3 05/31/2017 0837   VLDL 31 (H) 05/31/2017 0837   LDLCALC 151 (H) 05/31/2017 0837    Home Medications   Current Meds  Medication Sig  . ibuprofen (ADVIL,MOTRIN) 200 MG tablet Take 800 mg by mouth every 6 (six) hours as needed for headache or moderate pain.  Marland Kitchen levothyroxine  (SYNTHROID, LEVOTHROID) 50 MCG tablet TAKE 1 TABLET BY MOUTH EVERY DAY  . metoprolol tartrate (LOPRESSOR) 25 MG tablet Take 1 tablet (25 mg total) by mouth 2 (two) times daily.  Marland Kitchen omeprazole (PRILOSEC OTC) 20 MG tablet Take 20 mg by mouth at bedtime.  Marland Kitchen oxybutynin (DITROPAN XL) 15 MG 24 hr tablet TAKE 1 TABLET BY MOUTH EVERYDAY AT BEDTIME  . sertraline (ZOLOFT) 100 MG tablet Take 1 tablet (100 mg total) by mouth daily.  Marland Kitchen zolpidem (AMBIEN) 10 MG tablet TAKE 1 TABLET (10 MG TOTAL) BY MOUTH AT BEDTIME AS NEEDED FOR SLEEP.      Review of Systems   Review of Systems  Constitution: Negative for chills, fever and malaise/fatigue.  Cardiovascular: Positive for dyspnea on exertion and palpitations. Negative for chest pain, leg swelling, near-syncope, orthopnea and syncope.  Respiratory: Negative for cough, shortness of breath and wheezing.   Gastrointestinal: Negative for nausea and vomiting.  Neurological: Negative for dizziness, light-headedness and weakness.   All other systems reviewed and are otherwise negative except as noted above.  Physical Exam    VS:  BP 110/70 (BP Location: Left Wrist, Patient Position: Sitting, Cuff Size: Normal)   Pulse 79   Ht 5\' 2"  (1.575 m)   Wt 276 lb (125.2 kg)   SpO2 98%   BMI 50.48 kg/m  , BMI Body mass index is 50.48 kg/m. GEN: Well nourished, overweight, well developed, in no acute distress. HEENT: normal. Neck: Supple, no JVD, carotid bruits, or masses. Cardiac: RRR, no murmurs, rubs, or gallops. No clubbing, cyanosis, edema.  Radials/DP/PT 2+ and equal bilaterally.  Respiratory:  Respirations regular and unlabored, clear to auscultation bilaterally. GI: Soft, nontender, nondistended, BS + x 4. MS: No deformity or atrophy. Skin: Warm and dry, no rash. Neuro:  Strength and sensation are intact. Psych: Normal affect.  Accessory Clinical Findings    ECG personally reviewed by me today -sinus and 79 bpm with nonspecific ST/T wave changes in  inferior leads.- no acute changes.  Assessment & Plan    1. Palpitations - Worsening over the last 6 months.  Reports episodes of rapid heart rates with minimal activity.  Likely paroxysmal atrial tachycardia.  Will place 14-day ZIO monitor today.  She was recommended to avoid caffeine as she drinks a significant amount.  She was recommended to avoid Benadryl as it is proarrhythmic medication, she is agreeable to switch to Claritin or Zyrtec.  We discussed the role that recent stress and anxiety could have on palpitations.  She will continue her metoprolol 25 mg twice daily.  In the future we could consider increasing the dose of lifestyle changes do not improve her symptoms.  2. DOE- Likely deconditioning.  Will check echocardiogram to rule out heart failure.  3. HTN -  Bp well controlled today. No lightheadedness, dizziness, near-syncope.  Continue present antihypertensive regimen.  4. Hypothyroidism - TSH last checked 2 years ago.  Check today.  Continue levothyroxine as prescribed by her PCP.  5. Mitral valve prolapse -reported history.  Will obtain echocardiogram as this could be contributory palpitations.  Disposition: 14 day ZIO placed today. Echocardiogram. Follow up in 5 week(s) with Dr. Rockey Situ or APP.   Loel Dubonnet, NP 11/14/2019, 2:40 PM

## 2019-11-15 LAB — CBC WITH DIFFERENTIAL/PLATELET
Basophils Absolute: 0 10*3/uL (ref 0.0–0.2)
Basos: 0 %
EOS (ABSOLUTE): 0.2 10*3/uL (ref 0.0–0.4)
Eos: 3 %
Hematocrit: 44.6 % (ref 34.0–46.6)
Hemoglobin: 15.3 g/dL (ref 11.1–15.9)
Immature Grans (Abs): 0.1 10*3/uL (ref 0.0–0.1)
Immature Granulocytes: 1 %
Lymphocytes Absolute: 1.9 10*3/uL (ref 0.7–3.1)
Lymphs: 21 %
MCH: 30.2 pg (ref 26.6–33.0)
MCHC: 34.3 g/dL (ref 31.5–35.7)
MCV: 88 fL (ref 79–97)
Monocytes Absolute: 0.4 10*3/uL (ref 0.1–0.9)
Monocytes: 5 %
Neutrophils Absolute: 6.4 10*3/uL (ref 1.4–7.0)
Neutrophils: 70 %
Platelets: 343 10*3/uL (ref 150–450)
RBC: 5.07 x10E6/uL (ref 3.77–5.28)
RDW: 13.3 % (ref 11.7–15.4)
WBC: 9 10*3/uL (ref 3.4–10.8)

## 2019-11-15 LAB — BASIC METABOLIC PANEL
BUN/Creatinine Ratio: 14 (ref 12–28)
BUN: 13 mg/dL (ref 8–27)
CO2: 18 mmol/L — ABNORMAL LOW (ref 20–29)
Calcium: 9.6 mg/dL (ref 8.7–10.3)
Chloride: 102 mmol/L (ref 96–106)
Creatinine, Ser: 0.9 mg/dL (ref 0.57–1.00)
GFR calc Af Amer: 78 mL/min/{1.73_m2} (ref >59)
GFR calc non Af Amer: 68 mL/min/{1.73_m2} (ref >59)
Glucose: 117 mg/dL — ABNORMAL HIGH (ref 65–99)
Potassium: 4.2 mmol/L (ref 3.5–5.2)
Sodium: 138 mmol/L (ref 134–144)

## 2019-11-15 LAB — MAGNESIUM: Magnesium: 2.2 mg/dL (ref 1.6–2.3)

## 2019-11-15 LAB — TSH: TSH: 3.79 u[IU]/mL (ref 0.450–4.500)

## 2019-11-17 ENCOUNTER — Telehealth: Payer: Self-pay

## 2019-11-17 NOTE — Telephone Encounter (Signed)
-----   Message from Loel Dubonnet, NP sent at 11/16/2019 11:10 AM EST ----- Normal thyroid, electrolytes, kidney function. CBC normal with no signs of anemia or infection. Mildly elevated glucose (blood sugar) which is not of concern. Good result! No sign of the cause of her palpitations, plan to proceed with ZIO monitor and echocardiogram as we discussed in recent office visit.

## 2019-11-17 NOTE — Telephone Encounter (Signed)
Attempted to call patient. LMTCB 11/17/2019

## 2019-11-17 NOTE — Telephone Encounter (Signed)
Call returned by patient.   Reviewed labs.   No further orders at this time.   Continue with POC.   Advised pt to call for any further questions or concerns.

## 2019-12-03 ENCOUNTER — Telehealth: Payer: Self-pay | Admitting: Family

## 2019-12-03 NOTE — Telephone Encounter (Signed)
Patient states she had a phone call around 1:30 pm regarding the cost of her heart monitor. Patient is unsure who called but quoted her the cost of paying 190.00 oop for her monitor. Patient is not sure who called but would like to be called back and explained the cost  Please advise

## 2019-12-03 NOTE — Telephone Encounter (Signed)
Called patient. She received a call yesterday about the billing for her ZIO monitor that she only wore for 2 days. She was napping at the time and did not remember who called but has more questions. Gave her the number for [employer] and advised her to call them back.  Advised her to ask about payment assistance if the amount is too much for her to afford.

## 2019-12-05 ENCOUNTER — Other Ambulatory Visit: Payer: 59

## 2019-12-17 ENCOUNTER — Telehealth: Payer: Self-pay | Admitting: Family

## 2019-12-17 NOTE — Telephone Encounter (Signed)
Returned the patients call. Advised her that based on her complaint of DOE an echo was ordered to evaluate her heart muscle squeeze, size and structure. Per Laurann Montana, NP documentation. DOE- Likely deconditioning.  Will check echocardiogram to rule out heart failure.  Patient asked if the echo will be covered by insurance. Advised her that the test will go through prior auth for approval and that she will need to contact her insurance to inquire what her out of pocket cost would be if any.  Patient verbalized understanding and has no other questions at this time.

## 2019-12-17 NOTE — Telephone Encounter (Signed)
Patient would like a call back to discuss why she needs an echocardiogram. State she will be available until 3 pm, after that she will not be available until Monday 3/8.

## 2019-12-23 ENCOUNTER — Ambulatory Visit: Payer: 59 | Admitting: Cardiovascular Disease

## 2019-12-29 ENCOUNTER — Telehealth: Payer: Self-pay

## 2019-12-29 NOTE — Telephone Encounter (Signed)
Spoke with patient regarding results.  Patient verbalizes understanding. Advised patient to call back with any issues or concerns.  

## 2019-12-29 NOTE — Telephone Encounter (Signed)
-----   Message from Loel Dubonnet, NP sent at 12/28/2019  9:08 PM EDT ----- Good result! Monitor shows normal sinus rhythm. No significant arrhythmia nor pause noted. Rare (less than 1%) early beats which are not dangerous. Reassuring result.

## 2020-01-16 ENCOUNTER — Other Ambulatory Visit: Payer: Self-pay | Admitting: Family Medicine

## 2020-01-16 DIAGNOSIS — F5104 Psychophysiologic insomnia: Secondary | ICD-10-CM

## 2020-01-28 ENCOUNTER — Other Ambulatory Visit: Payer: 59

## 2020-03-05 ENCOUNTER — Ambulatory Visit (INDEPENDENT_AMBULATORY_CARE_PROVIDER_SITE_OTHER): Payer: 59 | Admitting: Family Medicine

## 2020-03-05 ENCOUNTER — Telehealth: Payer: Self-pay

## 2020-03-05 ENCOUNTER — Other Ambulatory Visit: Payer: Self-pay

## 2020-03-05 ENCOUNTER — Encounter: Payer: Self-pay | Admitting: Family Medicine

## 2020-03-05 VITALS — BP 118/78 | HR 72 | Temp 97.7°F | Resp 17 | Ht 62.0 in | Wt 277.4 lb

## 2020-03-05 DIAGNOSIS — M545 Low back pain, unspecified: Secondary | ICD-10-CM

## 2020-03-05 DIAGNOSIS — R634 Abnormal weight loss: Secondary | ICD-10-CM

## 2020-03-05 DIAGNOSIS — R413 Other amnesia: Secondary | ICD-10-CM | POA: Diagnosis not present

## 2020-03-05 DIAGNOSIS — E782 Mixed hyperlipidemia: Secondary | ICD-10-CM | POA: Diagnosis not present

## 2020-03-05 DIAGNOSIS — R7309 Other abnormal glucose: Secondary | ICD-10-CM | POA: Diagnosis not present

## 2020-03-05 DIAGNOSIS — R8290 Unspecified abnormal findings in urine: Secondary | ICD-10-CM

## 2020-03-05 LAB — POCT URINALYSIS DIPSTICK
Bilirubin, UA: NEGATIVE
Blood, UA: NEGATIVE
Glucose, UA: NEGATIVE
Ketones, UA: NEGATIVE
Nitrite, UA: NEGATIVE
Protein, UA: NEGATIVE
Spec Grav, UA: <=1.005 — AB (ref 1.010–1.025)
Urobilinogen, UA: 0.2 E.U./dL
pH, UA: 7 (ref 5.0–8.0)

## 2020-03-05 LAB — POCT GLYCOSYLATED HEMOGLOBIN (HGB A1C): Hemoglobin A1C: 5.9 % — AB (ref 4.0–5.6)

## 2020-03-05 MED ORDER — NITROFURANTOIN MONOHYD MACRO 100 MG PO CAPS: 100.0000 mg | ORAL_CAPSULE | Freq: Two times a day (BID) | ORAL | 0 refills | Status: AC

## 2020-03-05 NOTE — Patient Instructions (Signed)
As we discussed, we are treating you for a urinary tract infection.  I have sent in a prescription for Macrobid 100mg , to take 1 tablet 2x per day for the next 5 days.  We have also sent your urine for culture and will call you once we receive the results.  Have your labs completed and we will be in touch with the results.  I have put in a referral to Neurology, per your request.  You should hear something from their office or our referral coordinator within 1 week, if you have not heard anything, please contact our office and I will follow up on this for you.  As we discussed, if you decide that you want to restart on medication or have a referral for counseling/therapy, please let me know how I can help you.  I am your partner in healthcare and can help as you need me to.  The following recommendations are helpful adjuncts for helping rebalance your mood.  Eat a nourishing diet. Ensure adequate intake of calories, protein, carbs, fat, vitamins, and minerals. Prioritize whole foods at each meal, including meats, vegetables, fruits, nuts and seeds, etc.   Avoid inflammatory and/or "junk" foods, such as sugar, omega-6 fats, refined grains, chemicals, and preservatives are common in packaged and prepared foods. Minimize or completely avoid these ingredients and stick to whole foods with little to no additives. Cook from scratch as much as possible for more control over what you eat  Get enough sleep. Poor sleep is significantly associated with depression and anxiety. Make 7-9 hours of sleep nightly a top priority  Exercise appropriately. Exercise is known to improve brain functioning and boost mood. Aim for 30 minutes of daily physical activity. Avoid "overtraining," which can cause mental disturbances  Assess your light exposure. Not enough natural light during the day and too much artificial light can have a major impact on your mood. Get outside as often as possible during daylight hours. Minimize  light exposure after dark and avoid the use of electronics that give off blue light before bed  Manage your stress.  Use daily stress management techniques such as meditation, yoga, or mindfulness to retrain your brain to respond differently to stress. Try deep breathing to deactivate your "fight or flight" response.  There are many of sources with apps like Headspace, Calm or a variety of YouTube videos (videos from Gwynne Edinger have guided meditation)  Prioritize your social life. Work on building social support with new friends or improve current relationships. Consider getting a pet that allows for companionship, social interaction, and physical touch. Try volunteering or joining a faith-based community to increase your sense of purpose  4-7-8 breathing technique at bedtime: breathe in to count of 4, hold breath for count of 7, exhale for count of 8; do 3-5 times for letting go of overactive thoughts  Take time to play Unstructured "play" time can help reduce anxiety and depression Options for play include music, games, sports, dance, art, etc.  Try to add daily omega 3 fatty acids, magnesium, B complex, and balanced amino acid supplements to help improve mood and anxiety.  We will plan to see you back in 4 weeks for memory loss follow up  You will receive a survey after today's visit either digitally by e-mail or paper by Viola mail. Your experiences and feedback matter to [place].  Please respond so we know how we are doing as we provide care for you.  Call us with any questions/concerns/needs.  It is my goal to be available to you for your health concerns.  Thanks for choosing me to be a partner in your healthcare needs!  Harlin Rain, FNP-C Family Nurse Practitioner [facility] Phone: [phone]

## 2020-03-05 NOTE — Assessment & Plan Note (Signed)
See memory loss A/P

## 2020-03-05 NOTE — Progress Notes (Signed)
Subjective:    Patient ID: Denise Glass, female    DOB: 01/22/1956, 64 y.o.   MRN: [id number]  Denise Glass is a 64 y.o. female presenting on 03/05/2020 for Memory Loss (pt concern for dementia, because of forgetfulness. ) and Back Pain (intermittent lower back pain x 1 weeks )   HPI  Ms. Riesen presents to clinic for evaluation of memory loss x 6 months.  States she has been told by family/friends over the past 6 months that she is repeating herself, has been forgetting what day of the week it is, getting her days/nights confused and recently made herself breakfast and then went to bed, leaving the stove top on.    Reports has been having low back pain x 1 week.  Denies any acute injury/trauma/fall, dysuria, urinary frequency, urgency, hesitancy, saddle anesthesia or change in bowel/bladder function.  Has increased anxiety regarding memory loss, stating she had lost her husband of 10 years shortly after a brain tumor was diagnosed.  Requesting to follow up with [facility] for evaluation.  Depression screen [facility] 2/9 03/05/2020 08/25/2019 04/29/2019  Decreased Interest 3 2 3   Down, Depressed, Hopeless 3 1 3   PHQ - 2 Score 6 3 6   Altered sleeping 3 3 3   Tired, decreased energy 3 1 3   Change in appetite 0 0 3  Feeling bad or failure about yourself  1 0 3  Trouble concentrating 3 0 2  Moving slowly or fidgety/restless 0 0 0  Suicidal thoughts 0 0 0  PHQ-9 Score 16 7 20   Difficult doing work/chores Very difficult Somewhat difficult Extremely dIfficult  Some recent data might be hidden    Social History   Tobacco Use  . Smoking status: Never Smoker  . Smokeless tobacco: Never Used  Substance Use Topics  . Alcohol use: No  . Drug use: No    Review of Systems  Constitutional: Negative.   HENT: Negative.   Eyes: Negative.   Respiratory: Negative.   Cardiovascular: Negative.   Gastrointestinal: Negative.   Endocrine: Negative.   Genitourinary: Negative.     Musculoskeletal: Positive for back pain. Negative for arthralgias, gait problem, joint swelling, myalgias, neck pain and neck stiffness.  Skin: Negative.   Allergic/Immunologic: Negative.   Neurological: Negative.   Hematological: Negative.   Psychiatric/Behavioral: Positive for dysphoric mood. Negative for agitation, behavioral problems, confusion, decreased concentration, hallucinations, self-injury, sleep disturbance and suicidal ideas. The patient is nervous/anxious. The patient is not hyperactive.    Per HPI unless specifically indicated above     Objective:    BP 118/78   Pulse 72   Temp 97.7 F (36.5 C) (Temporal)   Resp 17   Ht 5\' 2"  (1.575 m)   Wt 277 lb 6.4 oz (125.8 kg)   SpO2 98%   BMI 50.74 kg/m   Wt Readings from Last 3 Encounters:  03/05/20 277 lb 6.4 oz (125.8 kg)  11/14/19 276 lb (125.2 kg)  08/25/19 276 lb 3.2 oz (125.3 kg)    Physical Exam Vitals reviewed.  Constitutional:      General: She is not in acute distress.    Appearance: Normal appearance. She is well-developed and well-groomed. She is obese. She is not ill-appearing or toxic-appearing.  HENT:     Head: Normocephalic.  Eyes:     General: Lids are normal. Vision grossly intact.        Right eye: No discharge.        Left eye: No  discharge.     Extraocular Movements: Extraocular movements intact.     Conjunctiva/sclera: Conjunctivae normal.     Pupils: Pupils are equal, round, and reactive to light.  Cardiovascular:     Rate and Rhythm: Normal rate and regular rhythm.     Pulses: Normal pulses.     Heart sounds: Normal heart sounds. No murmur. No friction rub. No gallop.   Pulmonary:     Effort: Pulmonary effort is normal. No respiratory distress.     Breath sounds: Normal breath sounds.  Musculoskeletal:     Thoracic back: Normal.     Lumbar back: Normal.     Right lower leg: No edema.     Left lower leg: No edema.     Comments: No CVA tenderness  Skin:    General: Skin is warm and  dry.     Capillary Refill: Capillary refill takes less than 2 seconds.  Neurological:     General: No focal deficit present.     Mental Status: She is alert and oriented to person, place, and time.     Cranial Nerves: Cranial nerves are intact. No cranial nerve deficit.     Sensory: Sensation is intact. No sensory deficit.     Motor: Motor function is intact. No weakness.     Coordination: Coordination is intact. Coordination normal.     Gait: Gait is intact. Gait normal.     Deep Tendon Reflexes: Reflexes are normal and symmetric.  Psychiatric:        Attention and Perception: Attention and perception normal.        Mood and Affect: Mood is anxious and depressed. Affect is tearful.        Speech: Speech normal.        Behavior: Behavior normal. Behavior is cooperative.        Thought Content: Thought content normal.        Cognition and Memory: Cognition and memory normal.        Judgment: Judgment normal.    Results for orders placed or performed in visit on 03/05/20  POCT Urinalysis Dipstick  Result Value Ref Range   Color, UA light yellow    Clarity, UA clear    Glucose, UA Negative Negative   Bilirubin, UA negative    Ketones, UA negative    Spec Grav, UA <=1.005 (A) 1.010 - 1.025   Blood, UA negative    pH, UA 7.0 5.0 - 8.0   Protein, UA Negative Negative   Urobilinogen, UA 0.2 0.2 or 1.0 E.U./dL   Nitrite, UA negative    Leukocytes, UA Large (3+) (A) Negative   Appearance     Odor    POCT glycosylated hemoglobin (Hb A1C)  Result Value Ref Range   Hemoglobin A1C 5.9 (A) 4.0 - 5.6 %   HbA1c POC (<> result, manual entry)     HbA1c, POC (prediabetic range)     HbA1c, POC (controlled diabetic range)        Assessment & Plan:   Problem List Items Addressed This Visit      Other   Mixed hyperlipidemia   Relevant Orders   Lipid Profile   Memory loss    Reports this has been going on approximately 6 months that she has been being told by friends/family that she is  repeating herself.  Reports she has been mixing up what day of the week it is, what she has spoken with family/friends about, recently made breakfast on the  stove, went to bed and left the stove on.  Does have some increased anxiety/depression on exam today and some abnormal urine findings.  Discussed with patient that likely has UTI and could have memory issues with increased anxiety/depression.  Patient states her spouse had passed away years ago after he had been diagnosed with a brain tumor and she is anxious regarding this.  Would like to follow up with [facility] for evaluation/treatment.  Plan: 1. Referral to [place] [facility] for evaluation 2. Treating abnormal urine findings and LBP as UTI with Macrobid 100mg  BID x 5 days 3. Discussed treatment plans for anxiety/depression with medication management/therapy/counseling, patient declines at this time 4. Have labs drawn for evaluation of memory loss concerns 5. Will follow up in 4 weeks       Relevant Orders   HIV antibody (with reflex)   Hepatitis C Antibody   RPR   CBC with Differential   COMPLETE METABOLIC PANEL WITH GFR   Ambulatory referral to Neurology   Low back pain    See memory loss A/P      Relevant Medications   nitrofurantoin, macrocrystal-monohydrate, (MACROBID) 100 MG capsule   Other Relevant Orders   POCT Urinalysis Dipstick (Completed)    Other Visit Diagnoses    Elevated glucose    -  Primary   Relevant Orders   POCT glycosylated hemoglobin (Hb A1C) (Completed)   Weight loss       Relevant Orders   Thyroid Panel With TSH   Abnormal urine findings       Relevant Medications   nitrofurantoin, macrocrystal-monohydrate, (MACROBID) 100 MG capsule   Other Relevant Orders   Urine Culture      Meds ordered this encounter  Medications  . nitrofurantoin, macrocrystal-monohydrate, (MACROBID) 100 MG capsule    Sig: Take 1 capsule (100 mg total) by mouth 2 (two) times daily for 5 days.    Dispense:  10  capsule    Refill:  0      Follow up plan: Return in about 4 weeks (around 04/02/2020) for Memory F/U.   Harlin Rain, [place] Family Nurse Practitioner [facility] 03/05/2020, 3:34 PM

## 2020-03-05 NOTE — Assessment & Plan Note (Signed)
Reports this has been going on approximately 6 months that she has been being told by friends/family that she is repeating herself.  Reports she has been mixing up what day of the week it is, what she has spoken with family/friends about, recently made breakfast on the stove, went to bed and left the stove on.  Does have some increased anxiety/depression on exam today and some abnormal urine findings.  Discussed with patient that likely has UTI and could have memory issues with increased anxiety/depression.  Patient states her spouse had passed away years ago after he had been diagnosed with a brain tumor and she is anxious regarding this.  Would like to follow up with [facility] for evaluation/treatment.  Plan: 1. Referral to [facility] for evaluation 2. Treating abnormal urine findings and LBP as UTI with Macrobid 100mg  BID x 5 days 3. Discussed treatment plans for anxiety/depression with medication management/therapy/counseling, patient declines at this time 4. Have labs drawn for evaluation of memory loss concerns 5. Will follow up in 4 weeks

## 2020-03-05 NOTE — Telephone Encounter (Signed)
Copied from [place] [id number]. Topic: General - Other >> Mar 05, 2020  3:52 PM Leward Quan A wrote: Reason for CRM: Patient called to inform Cyndia Skeeters that she does not have to be referred to the specialist in [facility] so if there is one in the area closer she is willing to go see them. States that she will get the update when she come in on Monday for blood work.

## 2020-03-06 LAB — URINE CULTURE
MICRO NUMBER:: 10506191
SPECIMEN QUALITY:: ADEQUATE

## 2020-03-08 ENCOUNTER — Other Ambulatory Visit: Payer: Self-pay

## 2020-03-08 ENCOUNTER — Other Ambulatory Visit: Payer: 59

## 2020-03-19 ENCOUNTER — Encounter: Payer: Self-pay | Admitting: Family Medicine

## 2020-03-19 ENCOUNTER — Ambulatory Visit (INDEPENDENT_AMBULATORY_CARE_PROVIDER_SITE_OTHER): Payer: 59 | Admitting: Family Medicine

## 2020-03-19 ENCOUNTER — Emergency Department
Admission: EM | Admit: 2020-03-19 | Discharge: 2020-03-19 | Disposition: A | Discharge status: Home / Self Care | Payer: 59 | Attending: Emergency Medicine | Admitting: Emergency Medicine

## 2020-03-19 ENCOUNTER — Emergency Department: Payer: 59

## 2020-03-19 ENCOUNTER — Other Ambulatory Visit: Payer: Self-pay

## 2020-03-19 VITALS — BP 178/81 | HR 79 | Temp 97.5°F | Ht 62.0 in

## 2020-03-19 DIAGNOSIS — Z5321 Procedure and treatment not carried out due to patient leaving prior to being seen by health care provider: Secondary | ICD-10-CM | POA: Diagnosis not present

## 2020-03-19 DIAGNOSIS — R079 Chest pain, unspecified: Secondary | ICD-10-CM

## 2020-03-19 DIAGNOSIS — M62838 Other muscle spasm: Secondary | ICD-10-CM | POA: Diagnosis not present

## 2020-03-19 DIAGNOSIS — M5410 Radiculopathy, site unspecified: Secondary | ICD-10-CM | POA: Diagnosis not present

## 2020-03-19 DIAGNOSIS — B370 Candidal stomatitis: Secondary | ICD-10-CM

## 2020-03-19 LAB — BASIC METABOLIC PANEL
Anion gap: 10 (ref 5–15)
BUN: 10 mg/dL (ref 8–23)
CO2: 24 mmol/L (ref 22–32)
Calcium: 9.1 mg/dL (ref 8.9–10.3)
Chloride: 106 mmol/L (ref 98–111)
Creatinine, Ser: 0.85 mg/dL (ref 0.44–1.00)
GFR calc Af Amer: >60 mL/min (ref >60)
GFR calc non Af Amer: >60 mL/min (ref >60)
Glucose, Bld: 123 mg/dL — ABNORMAL HIGH (ref 70–99)
Potassium: 3.6 mmol/L (ref 3.5–5.1)
Sodium: 140 mmol/L (ref 135–145)

## 2020-03-19 LAB — CBC
HCT: 43.8 % (ref 36.0–46.0)
Hemoglobin: 14.4 g/dL (ref 12.0–15.0)
MCH: 29 pg (ref 26.0–34.0)
MCHC: 32.9 g/dL (ref 30.0–36.0)
MCV: 88.3 fL (ref 80.0–100.0)
Platelets: 369 10*3/uL (ref 150–400)
RBC: 4.96 MIL/uL (ref 3.87–5.11)
RDW: 13.9 % (ref 11.5–15.5)
WBC: 10.5 10*3/uL (ref 4.0–10.5)
nRBC: 0 % (ref 0.0–0.2)

## 2020-03-19 LAB — TROPONIN I (HIGH SENSITIVITY): Troponin I (High Sensitivity): 6 ng/L (ref <18)

## 2020-03-19 MED ORDER — CYCLOBENZAPRINE HCL 10 MG PO TABS: 5.0000 mg | ORAL_TABLET | Freq: Three times a day (TID) | ORAL | 0 refills | Status: DC | PRN

## 2020-03-19 MED ORDER — DEXAMETHASONE SODIUM PHOSPHATE 10 MG/ML IJ SOLN
10.0000 mg | Freq: Once | INTRAMUSCULAR | Status: AC
  Administered 2020-03-19: 10 mg via INTRAMUSCULAR

## 2020-03-19 MED ORDER — FLUCONAZOLE 150 MG PO TABS: ORAL_TABLET | ORAL | 0 refills | Status: DC

## 2020-03-19 NOTE — Patient Instructions (Addendum)
We have given you an injection of decadron today, this is a steroid and should help reduce the swelling and alleviate the left upper arm radiculopathy that you are having.  We reviewed your EKG from today, with the one taken at [facility] ER early this morning and your previous EKG from your cardiologist's office, no acute changes.  Reviewed [facility] chest xray from ER visit this morning and labs taken as well.  I have sent in a prescription for diflucan to take, if the steroid injection we gave you in clinic today gives you thrush, given your past history.  1. For your Headaches / Neck pain - Overall it seems reassuring. I don't see any findings on exam that are concerning for serious head injury or neurological injury.  2. Start cyclobenzaprine 5mg -10mg  (muscle relaxant) - use half or whole tablet up to 3 times daily (may make you drowsy)  3. Start Naproxen (Naprosyn) or OTC Aleve 500mg  twice daily (with food) for 5 to 7 days or may do Ibuprofen / Advil (400mg  every 6 to 8 hours) with food for 5 to 7 days  4. Increase Tylenol to 1000mg  up to 3 times daily for breakthrough pain for 3-5 days then only as needed  5. Use moist heat or heating pad on shoulders / neck, and have family member help with soft tissue massage as demonstrated  Please schedule a follow-up appointment with [place] in 2 to 4 weeks if symptoms not improving, or sooner if worsening  If develop severe headaches, nausea / vomiting, loss of vision, numbness, weakness or tingling - call 911 or go immediately to ED.  We will plan to see you back as needed for this  You will receive a survey after today's visit either digitally by e-mail or paper by [place] mail. Your experiences and feedback matter to [place].  Please respond so we know how we are doing as we provide care for you.  Call us with any questions/concerns/needs.  It is my goal to be available to you for your health concerns.  Thanks for choosing me to be a partner in your healthcare  needs!  Harlin Rain, FNP-C Family Nurse Practitioner [facility] Phone: [phone]

## 2020-03-19 NOTE — Assessment & Plan Note (Signed)
Muscle spasms of left sided cervical paraspinals, left subscapular spasm and left pectoralis major pain with palpation.  EKG similar to EKG completed in ER over night and compared to previous EKGs with Dr. Donivan Scull office.  Reviewed CXR and lab values.  MSK pain with muscle spasms.  Plan: 1. Decadron 10mg  IM given today 2. Rx for cyclobenzaprine 5-10mg  TID PRN for muscle spasms 3. Can use OTC Ibuprofen and acetaminophen (instructions in AVS) 4. Can use heat therapy as well as light massage to help reduce muscle spasm 5. Return to clinic in 2-4 weeks if no improvement in symptoms

## 2020-03-19 NOTE — Progress Notes (Signed)
Subjective:    Patient ID: Denise Glass, female    DOB: April 22, 1956, 64 y.o.   MRN: [id number]  Denise Glass is a 64 y.o. female presenting on 03/19/2020 for Muscle Pain (pt complains of left-sided chest pain, left shoulder blade pain with radiation into the left arm and into the left shoulder and neck. She went to the ER, but left before being seen. She had a Xray, EKG and labs done prior to leaving the ER. )   HPI  [name] presents to clinic for evaluation of left sided chest pain, left shoulder pain, left subscapular pain with pain radiating to left elbow.  Reports went to the ER over night and had labs, a chest xray and an EKG but left before seeing a provider.  Reports pain has been present for approximately 2 days and has improved since being in the ER overnight.  Denies visual changes, dizziness, lightheadedness, headache, palpitations, DOE, shortness of breath, cough, lower extremity edema.  Has not taken anything for her symptoms.  Depression screen [facility] 2/9 03/05/2020 08/25/2019 04/29/2019  Decreased Interest 3 2 3   Down, Depressed, Hopeless 3 1 3   PHQ - 2 Score 6 3 6   Altered sleeping 3 3 3   Tired, decreased energy 3 1 3   Change in appetite 0 0 3  Feeling bad or failure about yourself  1 0 3  Trouble concentrating 3 0 2  Moving slowly or fidgety/restless 0 0 0  Suicidal thoughts 0 0 0  PHQ-9 Score 16 7 20   Difficult doing work/chores Very difficult Somewhat difficult Extremely dIfficult  Some recent data might be hidden    Social History   Tobacco Use  . Smoking status: Never Smoker  . Smokeless tobacco: Never Used  Substance Use Topics  . Alcohol use: No  . Drug use: No    Review of Systems  Constitutional: Negative.   HENT: Negative.   Eyes: Negative.   Respiratory: Negative.   Cardiovascular: Negative.   Gastrointestinal: Negative.   Endocrine: Negative.   Genitourinary: Negative.   Musculoskeletal: Positive for myalgias and neck pain. Negative for  arthralgias, back pain, gait problem, joint swelling and neck stiffness.       Left shoulder, left chest wall pain, left upper arm pain  Skin: Negative.   Allergic/Immunologic: Negative.   Neurological: Positive for numbness. Negative for dizziness, tremors, seizures, syncope, facial asymmetry, speech difficulty, weakness, light-headedness and headaches.  Hematological: Negative.   Psychiatric/Behavioral: Negative.    Per HPI unless specifically indicated above     Objective:    BP (!) 178/81 (BP Location: Right Arm, Patient Position: Sitting, Cuff Size: Normal)   Pulse 79   Temp (!) 97.5 F (36.4 C) (Temporal)   Ht 5\' 2"  (1.575 m)   BMI 49.38 kg/m   Wt Readings from Last 3 Encounters:  03/19/20 270 lb (122.5 kg)  03/05/20 277 lb 6.4 oz (125.8 kg)  11/14/19 276 lb (125.2 kg)    Physical Exam Vitals reviewed.  Constitutional:      General: She is not in acute distress.    Appearance: Normal appearance. She is well-developed and well-groomed. She is obese. She is not ill-appearing or toxic-appearing.  HENT:     Head: Normocephalic.     Nose:     Comments: Denise Glass is in place, covering mouth and nose  Eyes:     General: Lids are normal. Vision grossly intact.        Right eye: No discharge.  Left eye: No discharge.     Extraocular Movements: Extraocular movements intact.     Conjunctiva/sclera: Conjunctivae normal.     Pupils: Pupils are equal, round, and reactive to light.  Cardiovascular:     Rate and Rhythm: Normal rate and regular rhythm.     Pulses: Normal pulses.     Heart sounds: Normal heart sounds. No murmur. No friction rub. No gallop.   Pulmonary:     Effort: Pulmonary effort is normal. No respiratory distress.     Breath sounds: Normal breath sounds.  Chest:    Musculoskeletal:        General: Tenderness present.     Left shoulder: Tenderness present. No swelling, deformity, effusion or laceration. Normal range of motion. Normal strength. Normal  pulse.     Left upper arm: Tenderness present. No swelling, edema, deformity, lacerations or bony tenderness.     Left elbow: Normal.     Cervical back: Full passive range of motion without pain and neck supple. Spasms present. No rigidity or tenderness.     Thoracic back: Normal.     Lumbar back: Normal.       Back:     Right lower leg: No edema.     Left lower leg: No edema.     Comments: Normal tone, BUE + BLE with 5/5 strength.  Full ROM left shoulder.  Lymphadenopathy:     Cervical: No cervical adenopathy.  Skin:    General: Skin is warm and dry.     Capillary Refill: Capillary refill takes less than 2 seconds.  Neurological:     General: No focal deficit present.     Mental Status: She is alert and oriented to person, place, and time.     Cranial Nerves: No cranial nerve deficit.     Sensory: No sensory deficit.     Motor: No weakness.     Coordination: Coordination normal.     Gait: Gait normal.  Psychiatric:        Attention and Perception: Attention and perception normal.        Mood and Affect: Mood is anxious. Affect is tearful.        Speech: Speech normal.        Behavior: Behavior normal. Behavior is cooperative.        Thought Content: Thought content normal.        Cognition and Memory: Cognition and memory normal.        Judgment: Judgment normal.    Results for orders placed or performed during the hospital encounter of 39/76/73  Basic metabolic panel  Result Value Ref Range   Sodium 140 135 - 145 mmol/L   Potassium 3.6 3.5 - 5.1 mmol/L   Chloride 106 98 - 111 mmol/L   CO2 24 22 - 32 mmol/L   Glucose, Bld 123 (H) 70 - 99 mg/dL   BUN 10 8 - 23 mg/dL   Creatinine, Ser 0.85 0.44 - 1.00 mg/dL   Calcium 9.1 8.9 - 10.3 mg/dL   GFR calc non Af Amer >60 >60 mL/min   GFR calc Af Amer >60 >60 mL/min   Anion gap 10 5 - 15  CBC  Result Value Ref Range   WBC 10.5 4.0 - 10.5 K/uL   RBC 4.96 3.87 - 5.11 MIL/uL   Hemoglobin 14.4 12.0 - 15.0 g/dL   HCT 43.8 36.0  - 46.0 %   MCV 88.3 80.0 - 100.0 fL   MCH 29.0 26.0 - 34.0  pg   MCHC 32.9 30.0 - 36.0 g/dL   RDW 13.9 11.5 - 15.5 %   Platelets 369 150 - 400 K/uL   nRBC 0.0 0.0 - 0.2 %  Troponin I (High Sensitivity)  Result Value Ref Range   Troponin I (High Sensitivity) 6 <18 ng/L      Assessment & Plan:   Problem List Items Addressed This Visit      Musculoskeletal and Integument   Muscle spasms of neck    Muscle spasms of left sided cervical paraspinals, left subscapular spasm and left pectoralis major pain with palpation.  EKG similar to EKG completed in ER over night and compared to previous EKGs with Dr. Donivan Scull office.  Reviewed CXR and lab values.  MSK pain with muscle spasms.  Plan: 1. Decadron 10mg  IM given today 2. Rx for cyclobenzaprine 5-10mg  TID PRN for muscle spasms 3. Can use OTC Ibuprofen and acetaminophen (instructions in AVS) 4. Can use heat therapy as well as light massage to help reduce muscle spasm 5. Return to clinic in 2-4 weeks if no improvement in symptoms       Other Visit Diagnoses    Chest pain, unspecified type    -  Primary   Relevant Orders   EKG 12-Lead   POCT Urinalysis Dipstick   Radiculopathy affecting upper extremity       Relevant Medications   dexamethasone (DECADRON) injection 10 mg (Completed)   cyclobenzaprine (FLEXERIL) 10 MG tablet   Neck muscle spasm       Relevant Medications   cyclobenzaprine (FLEXERIL) 10 MG tablet   Thrush, oral       Relevant Medications   fluconazole (DIFLUCAN) 150 MG tablet      Meds ordered this encounter  Medications  . dexamethasone (DECADRON) injection 10 mg  . cyclobenzaprine (FLEXERIL) 10 MG tablet    Sig: Take 0.5-1 tablets (5-10 mg total) by mouth 3 (three) times daily as needed for muscle spasms.    Dispense:  30 tablet    Refill:  0  . fluconazole (DIFLUCAN) 150 MG tablet    Sig: Take 1 tablet now and repeat dose in 72 hours if having continued symptoms.    Dispense:  2 tablet    Refill:  0       Follow up plan: Return if symptoms worsen or fail to improve.   Harlin Rain, [place] Family Nurse Practitioner [facility] 03/19/2020, 12:57 PM

## 2020-03-19 NOTE — ED Triage Notes (Signed)
Pt arrives to ED via POV from home with c/o CP x2 days. Pt reports left-sided CP with radiation into the left arm and into the left shoulder and neck. Pt denies N/V/D or fever. Pt reports some nausea, but denies emesis. Pt reports previous cardiac h/x (unsure of d/x) and is a pt of Dr Clayborn Bigness. Pt is A&O, in NAD; RR even, regular, and unlabored.

## 2020-03-23 ENCOUNTER — Other Ambulatory Visit: Payer: Self-pay

## 2020-03-23 ENCOUNTER — Ambulatory Visit (INDEPENDENT_AMBULATORY_CARE_PROVIDER_SITE_OTHER): Payer: 59 | Admitting: Family Medicine

## 2020-03-23 ENCOUNTER — Encounter: Payer: Self-pay | Admitting: Family Medicine

## 2020-03-23 VITALS — BP 140/80 | HR 83 | Temp 97.1°F | Ht 62.0 in | Wt 280.2 lb

## 2020-03-23 DIAGNOSIS — M542 Cervicalgia: Secondary | ICD-10-CM | POA: Diagnosis not present

## 2020-03-23 DIAGNOSIS — M25522 Pain in left elbow: Secondary | ICD-10-CM | POA: Diagnosis not present

## 2020-03-23 DIAGNOSIS — M62838 Other muscle spasm: Secondary | ICD-10-CM

## 2020-03-23 NOTE — Progress Notes (Signed)
Subjective:    Patient ID: Denise Glass, female    DOB: [birth year], 64 y.o.   MRN: [id number]  Denise Glass is a 64 y.o. female presenting on 03/23/2020 for Back Pain (left upper back pain, pt state it from the left shoulder blade and radiates down the arm to the elbow. She describe it as a constant sharp aching pain. x 4 days )   HPI  Denise Glass presents to clinic for continued left upper back pain with left shoulder pain that radiates to left elbow.  Reports having some relief immediately after decadron injection, but has not had relief with current treatment plan of ibuprofen, acetaminophen and cyclobenzaprine.  Denies numbness, tingling, weakness, changes in bowel/bladder function or saddle anesthesia.  Reports is a continued constant sharp and aching pain.  Depression screen [facility] 2/9 03/05/2020 08/25/2019 04/29/2019  Decreased Interest 3 2 3   Down, Depressed, Hopeless 3 1 3   PHQ - 2 Score 6 3 6   Altered sleeping 3 3 3   Tired, decreased energy 3 1 3   Change in appetite 0 0 3  Feeling bad or failure about yourself  1 0 3  Trouble concentrating 3 0 2  Moving slowly or fidgety/restless 0 0 0  Suicidal thoughts 0 0 0  PHQ-9 Score 16 7 20   Difficult doing work/chores Very difficult Somewhat difficult Extremely dIfficult  Some recent data might be hidden    Social History   Tobacco Use  . Smoking status: Never Smoker  . Smokeless tobacco: Never Used  Substance Use Topics  . Alcohol use: No  . Drug use: No    Review of Systems  Constitutional: Negative.   HENT: Negative.   Eyes: Negative.   Respiratory: Negative.   Cardiovascular: Negative.   Gastrointestinal: Negative.   Endocrine: Negative.   Genitourinary: Negative.   Musculoskeletal: Positive for myalgias and neck pain. Negative for arthralgias, back pain, gait problem, joint swelling and neck stiffness.       Left shoulder, left chest wall pain, left upper arm pain  Skin: Negative.   Allergic/Immunologic: Negative.     Neurological: Positive for numbness. Negative for dizziness, tremors, seizures, syncope, facial asymmetry, speech difficulty, weakness, light-headedness and headaches.  Hematological: Negative.   Psychiatric/Behavioral: Negative.    Per HPI unless specifically indicated above     Objective:    BP 140/80 (BP Location: Right Arm, Patient Position: Sitting, Cuff Size: Normal)   Pulse 83   Temp (!) 97.1 F (36.2 C) (Temporal)   Ht 5\' 2"  (1.575 m)   Wt 280 lb 3.2 oz (127.1 kg)   BMI 51.25 kg/m   Wt Readings from Last 3 Encounters:  03/23/20 280 lb 3.2 oz (127.1 kg)  03/19/20 270 lb (122.5 kg)  03/05/20 277 lb 6.4 oz (125.8 kg)    Physical Exam Vitals reviewed.  Constitutional:      General: She is not in acute distress.    Appearance: Normal appearance. She is well-developed and well-groomed. She is obese. She is not ill-appearing or toxic-appearing.  HENT:     Head: Normocephalic.     Nose:     Comments: [name] is in place, covering mouth and nose  Eyes:     General: Lids are normal. Vision grossly intact.  Pulmonary:     Effort: Pulmonary effort is normal. No respiratory distress.  Musculoskeletal:     Right lower leg: No edema.     Left lower leg: No edema.  Skin:    General:  Skin is dry.     Capillary Refill: Capillary refill takes less than 2 seconds.  Neurological:     General: No focal deficit present.     Mental Status: She is alert and oriented to person, place, and time.  Psychiatric:        Attention and Perception: Attention and perception normal.        Mood and Affect: Mood and affect normal.        Speech: Speech normal.        Behavior: Behavior normal. Behavior is cooperative.        Thought Content: Thought content normal.        Cognition and Memory: Cognition and memory normal.        Judgment: Judgment normal.    Results for orders placed or performed during the hospital encounter of 10/03/74  Basic metabolic panel  Result Value Ref Range    Sodium 140 135 - 145 mmol/L   Potassium 3.6 3.5 - 5.1 mmol/L   Chloride 106 98 - 111 mmol/L   CO2 24 22 - 32 mmol/L   Glucose, Bld 123 (H) 70 - 99 mg/dL   BUN 10 8 - 23 mg/dL   Creatinine, Ser 0.85 0.44 - 1.00 mg/dL   Calcium 9.1 8.9 - 10.3 mg/dL   GFR calc non Af Amer >60 >60 mL/min   GFR calc Af Amer >60 >60 mL/min   Anion gap 10 5 - 15  CBC  Result Value Ref Range   WBC 10.5 4.0 - 10.5 K/uL   RBC 4.96 3.87 - 5.11 MIL/uL   Hemoglobin 14.4 12.0 - 15.0 g/dL   HCT 43.8 36.0 - 46.0 %   MCV 88.3 80.0 - 100.0 fL   MCH 29.0 26.0 - 34.0 pg   MCHC 32.9 30.0 - 36.0 g/dL   RDW 13.9 11.5 - 15.5 %   Platelets 369 150 - 400 K/uL   nRBC 0.0 0.0 - 0.2 %  Troponin I (High Sensitivity)  Result Value Ref Range   Troponin I (High Sensitivity) 6 <18 ng/L      Assessment & Plan:   Problem List Items Addressed This Visit      Musculoskeletal and Integument   Muscle spasms of neck - Primary    Continued neck spasms causing patient to be unable to sit still comfortably.  Treatment failure with NSAIDs, acetaminophen and cyclobenzaprine.  Discussed referral to Orthopedic Urgent Care for evaluation.  FNP contacted [facility] to confirm they accepted patient's insurance, per patient request.  Address and directions for Orthopedic Urgent Care provided.  Plan: 1. Proceed to Orthopedic Urgent Care for evaluation and treatment plan      Relevant Orders   AMB referral to orthopedics    Other Visit Diagnoses    Neck pain       Relevant Orders   AMB referral to orthopedics   Left elbow pain       Relevant Orders   AMB referral to orthopedics      No orders of the defined types were placed in this encounter.     Follow up plan: Return if symptoms worsen or fail to improve.   Harlin Rain, [place] Family Nurse Practitioner [facility] 03/23/2020, 4:03 PM

## 2020-03-23 NOTE — Patient Instructions (Signed)
Please proceed to orthopedic urgent care at [facility] on [address] in [place].  They are open until 7:30pm  Continue all medications as directed  We will plan to see you back as needed  You will receive a survey after today's visit either digitally by e-mail or paper by [place] mail. Your experiences and feedback matter to [place].  Please respond so we know how we are doing as we provide care for you.  Call us with any questions/concerns/needs.  It is my goal to be available to you for your health concerns.  Thanks for choosing me to be a partner in your healthcare needs!  Harlin Rain, FNP-C Family Nurse Practitioner [facility] Phone: [phone]

## 2020-03-23 NOTE — Assessment & Plan Note (Signed)
Continued neck spasms causing patient to be unable to sit still comfortably.  Treatment failure with NSAIDs, acetaminophen and cyclobenzaprine.  Discussed referral to Orthopedic Urgent Care for evaluation.  FNP contacted [facility] to confirm they accepted patient's insurance, per patient request.  Address and directions for Orthopedic Urgent Care provided.  Plan: 1. Proceed to Orthopedic Urgent Care for evaluation and treatment plan

## 2020-03-29 ENCOUNTER — Other Ambulatory Visit: Payer: Self-pay | Admitting: Family Medicine

## 2020-03-29 DIAGNOSIS — M5410 Radiculopathy, site unspecified: Secondary | ICD-10-CM

## 2020-03-29 DIAGNOSIS — M62838 Other muscle spasm: Secondary | ICD-10-CM

## 2020-03-29 MED ORDER — CYCLOBENZAPRINE HCL 10 MG PO TABS: 5.0000 mg | ORAL_TABLET | Freq: Three times a day (TID) | ORAL | 0 refills | Status: DC | PRN

## 2020-03-29 NOTE — Telephone Encounter (Signed)
Requested medication (s) are due for refill today: yes  Requested medication (s) are on the active medication list: yes  Last refill:  03/19/2020  Future visit scheduled: No  Notes to clinic: this refill cannot be delegated    Requested Prescriptions  Pending Prescriptions Disp Refills   cyclobenzaprine (FLEXERIL) 10 MG tablet 30 tablet 0    Sig: Take 0.5-1 tablets (5-10 mg total) by mouth 3 (three) times daily as needed for muscle spasms.      Not Delegated - Analgesics:  Muscle Relaxants Failed - 03/29/2020 11:09 AM      Failed - This refill cannot be delegated      Passed - Valid encounter within last 6 months    Recent Outpatient Visits           6 days ago Muscle spasms of neck   [place], FNP   1 week ago Chest pain, unspecified type   [facility], Lupita Raider, FNP   3 weeks ago Elevated glucose   [place], FNP   7 months ago Paresthesia of left arm   [place], DO   10 months ago Tinnitus of right ear   [facility] [employer], NP

## 2020-03-29 NOTE — Telephone Encounter (Signed)
Copied from [place] [id number]. Topic: Quick Communication - Rx Refill/Question >> Mar 29, 2020 11:07 AM Leward Quan A wrote: Medication: cyclobenzaprine (FLEXERIL) 10 MG tablet   Has the patient contacted their pharmacy? Yes.   (Agent: If no, request that the patient contact the pharmacy for the refill.) (Agent: If yes, when and what did the pharmacy advise?)  Preferred Pharmacy (with phone number or street name): [id number] - [place], [address]  Phone:  [phone] Fax:  [phone]     Agent: Please be advised that RX refills may take up to 3 business days. We ask that you follow-up with your pharmacy.

## 2020-03-31 ENCOUNTER — Telehealth: Payer: Self-pay

## 2020-03-31 NOTE — Telephone Encounter (Signed)
Called patient and read her note of Denise Skeeters FNP.  Patient did go to [facility]  She was placed on Tramadol for pain 50mg  q6h as needed.  Dr found bone spurs on her spine She will start physical therapy Monday.  She states her Ritta Slot is much better but she still has pain that is in the center of her tongue and back of throat. She agrees to take whichever medication you think is best. Please advise patient in AM.

## 2020-03-31 NOTE — Telephone Encounter (Signed)
Unable to reach pt left message to call back.

## 2020-03-31 NOTE — Telephone Encounter (Signed)
Did she end up going to [facility] for her muscular concerns?  Did they change any of her medications or add new medications to her treatment plan?  I can send in a prescription for nystatin swish and swallow or can send in a refill on the diflucan if she prefers that.

## 2020-03-31 NOTE — Telephone Encounter (Addendum)
Copied from [place] [id number]. Topic: General - Other >> Mar 31, 2020 11:12 AM Hinda Lenis D wrote: Reason for CRM: PT needs having allergic reaction to her medicine / please advise    Pt called complaining of persistent thrush since getting that dexamethasone injection x 12 days ago. She took both rounds of Fluconazole with  no improvement. She said that its not bad at all. She is just concern because its not going away. The only time she notice any discomfort is when she brushes her teeth.

## 2020-04-01 ENCOUNTER — Ambulatory Visit: Payer: 59 | Admitting: Family Medicine

## 2020-04-01 ENCOUNTER — Other Ambulatory Visit: Payer: Self-pay | Admitting: Family Medicine

## 2020-04-01 DIAGNOSIS — B370 Candidal stomatitis: Secondary | ICD-10-CM

## 2020-04-01 MED ORDER — NYSTATIN 100000 UNIT/ML MT SUSP: 5.0000 mL | Freq: Four times a day (QID) | OROMUCOSAL | 0 refills | Status: DC

## 2020-04-01 NOTE — Progress Notes (Signed)
Sent in Nystatin for c/o thrush s/p dexamethasone injection.

## 2020-04-01 NOTE — Telephone Encounter (Signed)
I sent in an rx for nystatin swish and swallow.  Can use this to gargle with to make sure is getting the back of her throat as well.  Thanks

## 2020-04-01 NOTE — Telephone Encounter (Signed)
The pt was notified of the recommendation, no questions or concerns.

## 2020-04-18 ENCOUNTER — Other Ambulatory Visit: Payer: Self-pay | Admitting: Family Medicine

## 2020-04-18 DIAGNOSIS — R350 Frequency of micturition: Secondary | ICD-10-CM

## 2020-04-18 DIAGNOSIS — N3281 Overactive bladder: Secondary | ICD-10-CM

## 2020-04-18 DIAGNOSIS — N3942 Incontinence without sensory awareness: Secondary | ICD-10-CM

## 2020-04-18 NOTE — Telephone Encounter (Signed)
Requested Prescriptions  Pending Prescriptions Disp Refills  . oxybutynin (DITROPAN XL) 15 MG 24 hr tablet [Pharmacy Med Name: OXYBUTYNIN CL ER 15 MG TABLET] 90 tablet 1    Sig: TAKE 1 TABLET BY MOUTH EVERYDAY AT BEDTIME     Urology:  Bladder Agents Passed - 04/18/2020  9:17 AM      Passed - Valid encounter within last 12 months    Recent Outpatient Visits          3 weeks ago Muscle spasms of neck   [place], FNP   1 month ago Chest pain, unspecified type   [place], FNP   1 month ago Elevated glucose   [facility], Lupita Raider, FNP   7 months ago Paresthesia of left arm   [place], DO   11 months ago Tinnitus of right ear   [facility] Merrilyn Puma, Jerrel Ivory, NP

## 2020-04-28 ENCOUNTER — Institutional Professional Consult (permissible substitution): Payer: 59 | Admitting: Neurology

## 2020-04-29 ENCOUNTER — Other Ambulatory Visit: Payer: Self-pay | Admitting: Physician Assistant

## 2020-04-29 DIAGNOSIS — M5412 Radiculopathy, cervical region: Secondary | ICD-10-CM

## 2020-05-05 ENCOUNTER — Ambulatory Visit (INDEPENDENT_AMBULATORY_CARE_PROVIDER_SITE_OTHER): Payer: 59 | Admitting: Family Medicine

## 2020-05-05 ENCOUNTER — Other Ambulatory Visit: Payer: Self-pay

## 2020-05-05 ENCOUNTER — Encounter: Payer: Self-pay | Admitting: Family Medicine

## 2020-05-05 VITALS — BP 129/81 | HR 82 | Temp 98.2°F | Ht 62.0 in | Wt 278.2 lb

## 2020-05-05 DIAGNOSIS — H00012 Hordeolum externum right lower eyelid: Secondary | ICD-10-CM | POA: Diagnosis not present

## 2020-05-05 MED ORDER — ERYTHROMYCIN 5 MG/GM OP OINT: 1.0000 "application " | TOPICAL_OINTMENT | Freq: Two times a day (BID) | OPHTHALMIC | 0 refills | Status: DC

## 2020-05-05 NOTE — Patient Instructions (Addendum)
I have sent in a prescription for erythromycin to apply to the right eye 2x per day.  If having continued pain/swelling to the right lower eyelid in 1 week to contact your ophthalmologist for evaluation.  Can apply warm compresses to the right lower eyelid 3-4x per day, as tolerated. Can use a warm tea bag that has been steeped to the right lower eyelid.  We will plan to see you back as needed  You will receive a survey after today's visit either digitally by e-mail or paper by [place] mail. Your experiences and feedback matter to [place].  Please respond so we know how we are doing as we provide care for you.  Call us with any questions/concerns/needs.  It is my goal to be available to you for your health concerns.  Thanks for choosing me to be a partner in your healthcare needs!  Harlin Rain, FNP-C Family Nurse Practitioner [facility] Phone: [phone]

## 2020-05-05 NOTE — Progress Notes (Signed)
Subjective:    Patient ID: Denise Glass, female    DOB: [birth year], 64 y.o.   MRN: [id number]  Denise Glass is a 64 y.o. female presenting on 05/05/2020 for Stye (right lower eyelid x 1 day.. Painful to the touch w/ redness.)   HPI  Denise Glass presents to clinic for evaluation of right lower eyelid swelling and redness x 1 day.  Reports is painful to the touch.  Denies pain with eye movements, eye discharge/drainage, eye injury, light sensitivity, wearing contacts or make up.  Has not tried anything for symptoms prior to arrival.  Depression screen [facility] 2/9 03/05/2020 08/25/2019 04/29/2019  Decreased Interest 3 2 3   Down, Depressed, Hopeless 3 1 3   PHQ - 2 Score 6 3 6   Altered sleeping 3 3 3   Tired, decreased energy 3 1 3   Change in appetite 0 0 3  Feeling bad or failure about yourself  1 0 3  Trouble concentrating 3 0 2  Moving slowly or fidgety/restless 0 0 0  Suicidal thoughts 0 0 0  PHQ-9 Score 16 7 20   Difficult doing work/chores Very difficult Somewhat difficult Extremely dIfficult  Some recent data might be hidden    Social History   Tobacco Use  . Smoking status: Never Smoker  . Smokeless tobacco: Never Used  Vaping Use  . Vaping Use: Never used  Substance Use Topics  . Alcohol use: No  . Drug use: No    Review of Systems  Constitutional: Negative.   HENT: Negative.   Eyes: Negative.        Right lower eyelid redness and swelling  Respiratory: Negative.   Cardiovascular: Negative.   Gastrointestinal: Negative.   Endocrine: Negative.   Genitourinary: Negative.   Musculoskeletal: Negative.   Skin: Negative.   Allergic/Immunologic: Negative.   Neurological: Negative.   Hematological: Negative.   Psychiatric/Behavioral: Negative.    Per HPI unless specifically indicated above     Objective:    BP 129/81 (BP Location: Left Arm, Patient Position: Sitting, Cuff Size: Normal)   Pulse 82   Temp 98.2 F (36.8 C) (Oral)   Ht 5\' 2"  (1.575 m)   Wt 278 lb  3.2 oz (126.2 kg)   BMI 50.88 kg/m   Wt Readings from Last 3 Encounters:  05/05/20 278 lb 3.2 oz (126.2 kg)  03/23/20 280 lb 3.2 oz (127.1 kg)  03/19/20 270 lb (122.5 kg)    Physical Exam Vitals reviewed.  Constitutional:      General: She is not in acute distress.    Appearance: Normal appearance. She is well-developed and well-groomed. She is morbidly obese. She is not ill-appearing or toxic-appearing.  HENT:     Head: Normocephalic and atraumatic.     Nose:     Comments: Denise Glass is in place, covering mouth and nose. Eyes:     General: Lids are normal. Vision grossly intact. No scleral icterus.       Right eye: Hordeolum present. No discharge.        Left eye: No discharge or hordeolum.     Extraocular Movements: Extraocular movements intact.     Conjunctiva/sclera: Conjunctivae normal.     Pupils: Pupils are equal, round, and reactive to light.     Comments: Right lower eyelid with hordeolum.  No drainage or streaking.  Cardiovascular:     Pulses: Normal pulses.          Dorsalis pedis pulses are 2+ on the right side and 2+ on  the left side.  Pulmonary:     Effort: Pulmonary effort is normal. No respiratory distress.  Musculoskeletal:     Right lower leg: No edema.     Left lower leg: No edema.  Skin:    General: Skin is warm and dry.     Capillary Refill: Capillary refill takes less than 2 seconds.  Neurological:     General: No focal deficit present.     Mental Status: She is alert and oriented to person, place, and time.  Psychiatric:        Attention and Perception: Attention and perception normal.        Mood and Affect: Mood and affect normal.        Speech: Speech normal.        Behavior: Behavior normal. Behavior is cooperative.        Thought Content: Thought content normal.        Cognition and Memory: Cognition and memory normal.        Judgment: Judgment normal.    Results for orders placed or performed during the hospital encounter of 33/82/50  Basic  metabolic panel  Result Value Ref Range   Sodium 140 135 - 145 mmol/L   Potassium 3.6 3.5 - 5.1 mmol/L   Chloride 106 98 - 111 mmol/L   CO2 24 22 - 32 mmol/L   Glucose, Bld 123 (H) 70 - 99 mg/dL   BUN 10 8 - 23 mg/dL   Creatinine, Ser 0.85 0.44 - 1.00 mg/dL   Calcium 9.1 8.9 - 10.3 mg/dL   GFR calc non Af Amer >60 >60 mL/min   GFR calc Af Amer >60 >60 mL/min   Anion gap 10 5 - 15  CBC  Result Value Ref Range   WBC 10.5 4.0 - 10.5 K/uL   RBC 4.96 3.87 - 5.11 MIL/uL   Hemoglobin 14.4 12.0 - 15.0 g/dL   HCT 43.8 36 - 46 %   MCV 88.3 80.0 - 100.0 fL   MCH 29.0 26.0 - 34.0 pg   MCHC 32.9 30.0 - 36.0 g/dL   RDW 13.9 11.5 - 15.5 %   Platelets 369 150 - 400 K/uL   nRBC 0.0 0.0 - 0.2 %  Troponin I (High Sensitivity)  Result Value Ref Range   Troponin I (High Sensitivity) 6 <18 ng/L      Assessment & Plan:   Problem List Items Addressed This Visit      Other   Hordeolum externum of right lower eyelid - Primary    Present x 1 day with redness and pain.  Discussed use of topical warm compresses to apply to the area 3-4x per day.  Can use tea bags that have been steeped to help clear this area up as well.  Sent in rx for erythromycin ointment to apply thin ribbon to this area 2x per day.  Strict instructions to f/u with Ophthalmology with any worsening of symptoms or if still present with pain in 1 week.  Plan: 1. Begin erythromycin ointment to right lower eyelid 2x daily 2. Can use warm compresses 3-4x per day to the right lower eyelid 3. Follow up with ophthalmology if any worsening of symptoms, or if symptoms do not resolve in 1-2 weeks      Relevant Medications   erythromycin ophthalmic ointment      Meds ordered this encounter  Medications  . erythromycin ophthalmic ointment    Sig: Place 1 application into the right eye in the  morning and at bedtime.    Dispense:  3.5 g    Refill:  0      Follow up plan: Return if symptoms worsen or fail to improve.   Harlin Rain, [place] Family Nurse Practitioner [facility] 05/05/2020, 2:51 PM

## 2020-05-05 NOTE — Assessment & Plan Note (Signed)
Present x 1 day with redness and pain.  Discussed use of topical warm compresses to apply to the area 3-4x per day.  Can use tea bags that have been steeped to help clear this area up as well.  Sent in rx for erythromycin ointment to apply thin ribbon to this area 2x per day.  Strict instructions to f/u with Ophthalmology with any worsening of symptoms or if still present with pain in 1 week.  Plan: 1. Begin erythromycin ointment to right lower eyelid 2x daily 2. Can use warm compresses 3-4x per day to the right lower eyelid 3. Follow up with ophthalmology if any worsening of symptoms, or if symptoms do not resolve in 1-2 weeks

## 2020-05-17 ENCOUNTER — Other Ambulatory Visit: Payer: Self-pay

## 2020-05-17 ENCOUNTER — Ambulatory Visit
Admission: RE | Admit: 2020-05-17 | Discharge: 2020-05-17 | Disposition: A | Discharge status: Home / Self Care | Payer: 59 | Source: Ambulatory Visit | Attending: Physician Assistant | Admitting: Physician Assistant

## 2020-05-17 DIAGNOSIS — M5412 Radiculopathy, cervical region: Secondary | ICD-10-CM | POA: Diagnosis not present

## 2020-05-23 ENCOUNTER — Other Ambulatory Visit: Payer: Self-pay | Admitting: Family Medicine

## 2020-05-23 DIAGNOSIS — M62838 Other muscle spasm: Secondary | ICD-10-CM

## 2020-05-23 DIAGNOSIS — M5410 Radiculopathy, site unspecified: Secondary | ICD-10-CM

## 2020-05-23 NOTE — Telephone Encounter (Signed)
Requested medication (s) are due for refill today: yes  Requested medication (s) are on the active medication list: yes  Last refill:  03/29/20  Future visit scheduled: no  Notes to clinic:  med not delegated to NT to RF   Requested Prescriptions  Pending Prescriptions Disp Refills   cyclobenzaprine (FLEXERIL) 10 MG tablet [Pharmacy Med Name: CYCLOBENZAPRINE 10 MG TABLET] 30 tablet 0    Sig: Take 0.5-1 tablets (5-10 mg total) by mouth 3 (three) times daily as needed for muscle spasms.      Not Delegated - Analgesics:  Muscle Relaxants Failed - 05/23/2020  5:25 PM      Failed - This refill cannot be delegated      Passed - Valid encounter within last 6 months    Recent Outpatient Visits           2 weeks ago Hordeolum externum of right lower eyelid   [place], FNP   2 months ago Muscle spasms of neck   [place], FNP   2 months ago Chest pain, unspecified type   [place], FNP   2 months ago Elevated glucose   [facility], Lupita Raider, [place]   9 months ago Paresthesia of left arm   [place], Devonne Doughty, [place]

## 2020-06-07 ENCOUNTER — Other Ambulatory Visit: Payer: Self-pay

## 2020-06-07 ENCOUNTER — Ambulatory Visit (INDEPENDENT_AMBULATORY_CARE_PROVIDER_SITE_OTHER): Payer: 59 | Admitting: Family Medicine

## 2020-06-07 ENCOUNTER — Encounter: Payer: Self-pay | Admitting: Family Medicine

## 2020-06-07 VITALS — BP 159/87 | HR 83 | Temp 98.6°F | Ht 62.0 in | Wt 278.6 lb

## 2020-06-07 DIAGNOSIS — I10 Essential (primary) hypertension: Secondary | ICD-10-CM

## 2020-06-07 DIAGNOSIS — H60501 Unspecified acute noninfective otitis externa, right ear: Secondary | ICD-10-CM | POA: Diagnosis not present

## 2020-06-07 DIAGNOSIS — M62838 Other muscle spasm: Secondary | ICD-10-CM

## 2020-06-07 DIAGNOSIS — E039 Hypothyroidism, unspecified: Secondary | ICD-10-CM | POA: Diagnosis not present

## 2020-06-07 DIAGNOSIS — R300 Dysuria: Secondary | ICD-10-CM | POA: Diagnosis not present

## 2020-06-07 DIAGNOSIS — R413 Other amnesia: Secondary | ICD-10-CM

## 2020-06-07 LAB — POCT URINALYSIS DIPSTICK
Bilirubin, UA: NEGATIVE
Blood, UA: NEGATIVE
Glucose, UA: NEGATIVE
Ketones, UA: NEGATIVE
Leukocytes, UA: NEGATIVE
Nitrite, UA: NEGATIVE
Protein, UA: NEGATIVE
Spec Grav, UA: 1.02
Urobilinogen, UA: 0.2 U/dL
pH, UA: 5

## 2020-06-07 MED ORDER — NEOMYCIN-POLYMYXIN-HC 3.5-10000-1 OT SUSP: 4.0000 [drp] | Freq: Four times a day (QID) | OTIC | 0 refills | Status: DC

## 2020-06-07 NOTE — Assessment & Plan Note (Signed)
Uncontrolled hypertension.  BP is not at goal < 130/80.  Pt is not working on lifestyle modifications.  Is no longer taking her prescription for metoprolol.  Is not interested in restarting on her blood pressure medications.  Education and discussion with patient regarding hypertension as well as the effects on the organs and body.  Specifically, we spoke about kidney disease, kidney failure, heart attack, stroke and up to and including death, as likely outcomes if non-compliant with blood pressure regulation.  Discussed how all of these habits are attached to each other and each has the effect on each other.  Patient verbalized understanding and declines. Complications:  Morbid obesity, GERD, hyperlipidemia.  Plan: 1. Encouraged to restart on metoprolol 25mg  BID, declines 2. Obtain labs at next office visit 3. Encouraged heart healthy diet and increasing exercise to 30 minutes most days of the week, going no more than 2 days in a row without exercise. 4. Encouraged to check BP 1-2 x per week at home, keep log, and bring to clinic at next appointment. 5. Follow up 3 months.

## 2020-06-07 NOTE — Assessment & Plan Note (Signed)
Reviewed has been on levothyroxine for many years for control of her thyroid hormone.  Patient reports has recently stopped taking this medication.  Discussed having labs drawn to review thyroid function and further discussion if medication is needed.  Patient declines at this time for labs or refill on levothyroxine.

## 2020-06-07 NOTE — Assessment & Plan Note (Signed)
Right otitis externa.  Will treat with neomycin-polymixin-HC 4 drops in right ear 4x per day.    Plan: 1. Begin using neomycin-polymixin-HC ear drops in right ear 4 drops, 4x per day 2. RTC if symptoms worsen or fail to improve

## 2020-06-07 NOTE — Assessment & Plan Note (Signed)
Dysuria x 1 day, urine POCT negative.  Patient reports was having symptoms of urinary pressure at beginning of urination since earlier today, symptoms have lessened since the day has progressed.  Plan: 1. POCT U/A completed

## 2020-06-07 NOTE — Assessment & Plan Note (Signed)
States she had scheduled an appointment with [facility] but has cancelled this appointment.  Reports all symptoms of memory loss were situational and have resolved.  No longer interested in pursuing a referral to Neurology.

## 2020-06-07 NOTE — Assessment & Plan Note (Signed)
Is currently following with [facility].  Reports is following with their pain management department in [place].

## 2020-06-07 NOTE — Patient Instructions (Addendum)
I have sent in a prescription for ear drops to help clear up the right external ear infection.  Use 4 drops into the right ear 4x per day.  You may find it helpful to put the bottle under your arm or in your pocket for a few minutes before putting the drops in your ear to bring them to body temperature so they are not as uncomfortable.  Try to get exercise a minimum of 30 minutes per day at least 5 days per week as well as  adequate water intake all while measuring blood pressure a few times per week.  Keep a blood pressure log and bring back to clinic at your next visit.  If your readings are consistently over 130/80 to contact our office/send me a MyChart message and we will see you sooner.  Can try DASH and Mediterranean diet options, avoiding processed foods, lowering sodium intake, avoiding pork products, and eating a plant based diet for optimal health.  We will plan to see you back in 3 months for hypertension and thyroid follow up visit  You will receive a survey after today's visit either digitally by e-mail or paper by [place] mail. Your experiences and feedback matter to [place].  Please respond so we know how we are doing as we provide care for you.  Call us with any questions/concerns/needs.  It is my goal to be available to you for your health concerns.  Thanks for choosing me to be a partner in your healthcare needs!  Harlin Rain, FNP-C Family Nurse Practitioner [facility] Phone: [phone]

## 2020-06-07 NOTE — Progress Notes (Signed)
Subjective:    Patient ID: Denise Glass, female    DOB: Mar 14, 1956, 64 y.o.   MRN: [id number]  Denise Glass is a 64 y.o. female presenting on 06/07/2020 for Ear drainage (pt complains of pus drainage from the Rt ear x 2 weeks. She denies any pain or discomfort.) and Dysuria (urinary pressure with the first start of urination x x 1 day )   HPI  [name] presents to clinic for concerns of dysuria x 1 day and right ear drainage x 2 weeks.  Believes that she may have cut the inside of her right ear when she was scratching at it.  Does not have any pain, vertigo, dizziness, fever, facial/jaw pain, pain in teeth, popping or clicking in the ears.  Has concerns for urinary pressure upon the start of urination beginning today, symptoms have gotten better as the day has progressed.  Denies urinary urgency, hesitancy, frequency, feeling of incomplete bladder emptying, or hematuria.   Hypertension - She is not checking BP at home or outside of clinic.    - Current medications: None, has stopped Metoprolol 25mg  BID - She is not currently symptomatic. - Pt denies headache, lightheadedness, dizziness, changes in vision, chest tightness/pressure, palpitations, leg swelling, sudden loss of speech or loss of consciousness. - She  Reports no regular exercise. - Her diet is high in salt, high in fat, and high in carbohydrates.   Depression screen [facility] 2/9 03/05/2020 08/25/2019 04/29/2019  Decreased Interest 3 2 3   Down, Depressed, Hopeless 3 1 3   PHQ - 2 Score 6 3 6   Altered sleeping 3 3 3   Tired, decreased energy 3 1 3   Change in appetite 0 0 3  Feeling bad or failure about yourself  1 0 3  Trouble concentrating 3 0 2  Moving slowly or fidgety/restless 0 0 0  Suicidal thoughts 0 0 0  PHQ-9 Score 16 7 20   Difficult doing work/chores Very difficult Somewhat difficult Extremely dIfficult  Some recent data might be hidden    Social History   Tobacco Use  . Smoking status: Never Smoker  .  Smokeless tobacco: Never Used  Vaping Use  . Vaping Use: Never used  Substance Use Topics  . Alcohol use: No  . Drug use: No    Review of Systems  Constitutional: Negative.   HENT: Positive for ear discharge. Negative for congestion, dental problem, drooling, ear pain, facial swelling, hearing loss, mouth sores, nosebleeds, postnasal drip, rhinorrhea, sinus pressure, sinus pain, sneezing, sore throat, tinnitus, trouble swallowing and voice change.   Eyes: Negative.   Respiratory: Negative.   Cardiovascular: Negative.   Gastrointestinal: Negative.   Endocrine: Negative.   Genitourinary: Positive for dysuria. Negative for decreased urine volume, difficulty urinating, dyspareunia, enuresis, flank pain, frequency, genital sores, hematuria, menstrual problem, pelvic pain, urgency, vaginal bleeding, vaginal discharge and vaginal pain.  Musculoskeletal: Negative.   Skin: Negative.   Allergic/Immunologic: Negative.   Neurological: Negative.   Hematological: Negative.   Psychiatric/Behavioral: Negative.    Per HPI unless specifically indicated above     Objective:    BP (!) 159/87 (BP Location: Left Arm, Patient Position: Sitting, Cuff Size: Normal)   Pulse 83   Temp 98.6 F (37 C) (Oral)   Ht 5\' 2"  (1.575 m)   Wt 278 lb 9.6 oz (126.4 kg)   BMI 50.96 kg/m   Wt Readings from Last 3 Encounters:  06/07/20 278 lb 9.6 oz (126.4 kg)  05/05/20 278 lb 3.2 oz (  126.2 kg)  03/23/20 280 lb 3.2 oz (127.1 kg)    Physical Exam Vitals reviewed.  Constitutional:      General: She is not in acute distress.    Appearance: Normal appearance. She is well-developed and well-groomed. She is morbidly obese. She is not ill-appearing or toxic-appearing.  HENT:     Head: Normocephalic and atraumatic.     Right Ear: Tympanic membrane normal. Drainage, swelling and tenderness present. No middle ear effusion. There is no impacted cerumen. No mastoid tenderness.     Left Ear: Tympanic membrane, ear canal  and external ear normal. There is no impacted cerumen.     Nose:     Comments: Denise Glass is in place, covering mouth and nose. Eyes:     General: Lids are normal. Vision grossly intact.        Right eye: No discharge.        Left eye: No discharge.     Extraocular Movements: Extraocular movements intact.     Conjunctiva/sclera: Conjunctivae normal.     Pupils: Pupils are equal, round, and reactive to light.  Cardiovascular:     Pulses: Normal pulses.  Pulmonary:     Effort: Pulmonary effort is normal. No respiratory distress.  Musculoskeletal:     Right lower leg: No edema.     Left lower leg: No edema.  Skin:    General: Skin is warm and dry.     Capillary Refill: Capillary refill takes less than 2 seconds.  Neurological:     General: No focal deficit present.     Mental Status: She is alert and oriented to person, place, and time.  Psychiatric:        Attention and Perception: Attention and perception normal.        Mood and Affect: Mood and affect normal.        Speech: Speech normal.        Behavior: Behavior normal. Behavior is cooperative.        Thought Content: Thought content normal.        Cognition and Memory: Cognition and memory normal.        Judgment: Judgment normal.    Results for orders placed or performed during the hospital encounter of 38/75/64  Basic metabolic panel  Result Value Ref Range   Sodium 140 135 - 145 mmol/L   Potassium 3.6 3.5 - 5.1 mmol/L   Chloride 106 98 - 111 mmol/L   CO2 24 22 - 32 mmol/L   Glucose, Bld 123 (H) 70 - 99 mg/dL   BUN 10 8 - 23 mg/dL   Creatinine, Ser 0.85 0.44 - 1.00 mg/dL   Calcium 9.1 8.9 - 10.3 mg/dL   GFR calc non Af Amer >60 >60 mL/min   GFR calc Af Amer >60 >60 mL/min   Anion gap 10 5 - 15  CBC  Result Value Ref Range   WBC 10.5 4.0 - 10.5 K/uL   RBC 4.96 3.87 - 5.11 MIL/uL   Hemoglobin 14.4 12.0 - 15.0 g/dL   HCT 43.8 36 - 46 %   MCV 88.3 80.0 - 100.0 fL   MCH 29.0 26.0 - 34.0 pg   MCHC 32.9 30.0 - 36.0  g/dL   RDW 13.9 11.5 - 15.5 %   Platelets 369 150 - 400 K/uL   nRBC 0.0 0.0 - 0.2 %  Troponin I (High Sensitivity)  Result Value Ref Range   Troponin I (High Sensitivity) 6 <18 ng/L  Assessment & Plan:   Problem List Items Addressed This Visit      Cardiovascular and Mediastinum   Essential hypertension    Uncontrolled hypertension.  BP is not at goal < 130/80.  Pt is not working on lifestyle modifications.  Is no longer taking her prescription for metoprolol.  Is not interested in restarting on her blood pressure medications.  Education and discussion with patient regarding hypertension as well as the effects on the organs and body.  Specifically, we spoke about kidney disease, kidney failure, heart attack, stroke and up to and including death, as likely outcomes if non-compliant with blood pressure regulation.  Discussed how all of these habits are attached to each other and each has the effect on each other.  Patient verbalized understanding and declines. Complications:  Morbid obesity, GERD, hyperlipidemia.  Plan: 1. Encouraged to restart on metoprolol 25mg  BID, declines 2. Obtain labs at next office visit 3. Encouraged heart healthy diet and increasing exercise to 30 minutes most days of the week, going no more than 2 days in a row without exercise. 4. Encouraged to check BP 1-2 x per week at home, keep log, and bring to clinic at next appointment. 5. Follow up 3 months.         Endocrine   Hypothyroidism    Reviewed has been on levothyroxine for many years for control of her thyroid hormone.  Patient reports has recently stopped taking this medication.  Discussed having labs drawn to review thyroid function and further discussion if medication is needed.  Patient declines at this time for labs or refill on levothyroxine.        Nervous and Auditory   Acute otitis externa of right ear - Primary    Right otitis externa.  Will treat with neomycin-polymixin-HC 4 drops in  right ear 4x per day.    Plan: 1. Begin using neomycin-polymixin-HC ear drops in right ear 4 drops, 4x per day 2. RTC if symptoms worsen or fail to improve      Relevant Medications   neomycin-polymyxin-hydrocortisone (CORTISPORIN) 3.5-10000-1 OTIC suspension     Musculoskeletal and Integument   Muscle spasms of neck    Is currently following with [facility].  Reports is following with their pain management department in [place].        Other   Memory loss    States she had scheduled an appointment with [facility] but has cancelled this appointment.  Reports all symptoms of memory loss were situational and have resolved.  No longer interested in pursuing a referral to Neurology.      Dysuria    Dysuria x 1 day, urine POCT negative.  Patient reports was having symptoms of urinary pressure at beginning of urination since earlier today, symptoms have lessened since the day has progressed.  Plan: 1. POCT U/A completed      Relevant Orders   POCT Urinalysis Dipstick      Meds ordered this encounter  Medications  . neomycin-polymyxin-hydrocortisone (CORTISPORIN) 3.5-10000-1 OTIC suspension    Sig: Place 4 drops into the right ear 4 (four) times daily.    Dispense:  10 mL    Refill:  0      Follow up plan: Return in about 3 months (around 09/07/2020) for HTN and thyroid follow up visit.   Harlin Rain, [place] Family Nurse Practitioner [facility] 06/07/2020, 2:41 PM

## 2020-07-06 ENCOUNTER — Ambulatory Visit (INDEPENDENT_AMBULATORY_CARE_PROVIDER_SITE_OTHER): Payer: 59

## 2020-07-06 ENCOUNTER — Other Ambulatory Visit: Payer: Self-pay

## 2020-07-06 DIAGNOSIS — Z23 Encounter for immunization: Secondary | ICD-10-CM

## 2020-07-07 ENCOUNTER — Telehealth: Payer: Self-pay | Admitting: Family Medicine

## 2020-07-07 ENCOUNTER — Other Ambulatory Visit: Payer: Self-pay | Admitting: Family Medicine

## 2020-07-07 DIAGNOSIS — M5410 Radiculopathy, site unspecified: Secondary | ICD-10-CM

## 2020-07-07 DIAGNOSIS — Z23 Encounter for immunization: Secondary | ICD-10-CM

## 2020-07-07 DIAGNOSIS — M62838 Other muscle spasm: Secondary | ICD-10-CM

## 2020-07-07 MED ORDER — CYCLOBENZAPRINE HCL 10 MG PO TABS: 5.0000 mg | ORAL_TABLET | Freq: Three times a day (TID) | ORAL | 0 refills | Status: DC | PRN

## 2020-07-07 NOTE — Telephone Encounter (Signed)
Patient was in the clinic for immunization but was asking for muscle relaxer for hand pain, flexeril listed on her medication list.

## 2020-07-07 NOTE — Progress Notes (Signed)
Nurse visit for immunization.

## 2020-07-28 ENCOUNTER — Other Ambulatory Visit: Payer: Self-pay | Admitting: Family Medicine

## 2020-07-28 DIAGNOSIS — F5104 Psychophysiologic insomnia: Secondary | ICD-10-CM

## 2020-07-28 MED ORDER — ZOLPIDEM TARTRATE 10 MG PO TABS: 10.0000 mg | ORAL_TABLET | Freq: Every evening | ORAL | 2 refills | Status: DC | PRN

## 2020-07-28 NOTE — Telephone Encounter (Signed)
Personally reviewed [place] today, 07/28/20.  According to PMP aware, pt last received a refill on 04/21/20.  Refill of her controlled substance will be provided today.

## 2020-07-28 NOTE — Telephone Encounter (Signed)
Medication Refill - Medication: zolpidem (AMBIEN) 10 MG tablet   Has the patient contacted their pharmacy? Yes.   (Agent: If no, request that the patient contact the pharmacy for the refill.) (Agent: If yes, when and what did the pharmacy advise?)  Preferred Pharmacy (with phone number or street name):  [id number] - [place], [address]  [address] [place] [address]  Phone: [phone] Fax: [phone]     Agent: Please be advised that RX refills may take up to 3 business days. We ask that you follow-up with your pharmacy.

## 2020-07-28 NOTE — Telephone Encounter (Signed)
Requested medication (s) are due for refill today: yes  Requested medication (s) are on the active medication list: Yes  Last refill:  01/16/20  Future visit scheduled: No  Notes to clinic:  not delegated    Requested Prescriptions  Pending Prescriptions Disp Refills   zolpidem (AMBIEN) 10 MG tablet 30 tablet 2    Sig: Take 1 tablet (10 mg total) by mouth at bedtime as needed. for sleep      Not Delegated - Psychiatry:  Anxiolytics/Hypnotics Failed - 07/28/2020 10:41 AM      Failed - This refill cannot be delegated      Failed - Urine Drug Screen completed in last 360 days.      Passed - Valid encounter within last 6 months    Recent Outpatient Visits           1 month ago Acute otitis externa of right ear, unspecified type   [place], FNP   2 months ago Hordeolum externum of right lower eyelid   [place], FNP   4 months ago Muscle spasms of neck   [place], FNP   4 months ago Chest pain, unspecified type   [place], FNP   4 months ago Elevated glucose   [facility], Lupita Raider, [place]

## 2020-08-12 ENCOUNTER — Telehealth: Payer: Self-pay

## 2020-08-12 ENCOUNTER — Ambulatory Visit (INDEPENDENT_AMBULATORY_CARE_PROVIDER_SITE_OTHER): Payer: 59 | Admitting: Family Medicine

## 2020-08-12 ENCOUNTER — Other Ambulatory Visit: Payer: Self-pay

## 2020-08-12 ENCOUNTER — Encounter: Payer: Self-pay | Admitting: Family Medicine

## 2020-08-12 DIAGNOSIS — J029 Acute pharyngitis, unspecified: Secondary | ICD-10-CM

## 2020-08-12 NOTE — Telephone Encounter (Signed)
Copied from [place] [id number]. Topic: General - Other >> Aug 11, 2020  4:33 PM Leward Quan A wrote: Reason for CRM: Patient scheduled appointment for 11.20 AM tomorrow morning. Complained of sore throat on the right side and cough. Became annoyed and a bit upset when informed that her appointment would be virtual instead of in person. Per patient she does not have equipment for the virtual visit so asking for the nurse to call her before her visit. Ph# [phone]

## 2020-08-12 NOTE — Progress Notes (Signed)
Virtual Visit via Telephone  The purpose of this virtual visit is to provide medical care while limiting exposure to the novel coronavirus (COVID19) for both patient and office staff.  Consent was obtained for phone visit:  Yes.   Answered questions that patient had about telehealth interaction:  Yes.   I discussed the limitations, risks, security and privacy concerns of performing an evaluation and management service by telephone. I also discussed with the patient that there may be a patient responsible charge related to this service. The patient expressed understanding and agreed to proceed.  Patient is at home and is accessed via telephone Services are provided by Harlin Rain, FNP-C from [facility])  ---------------------------------------------------------------------- Chief Complaint  Patient presents with  . Sore Throat    Rt side of throat sore, coughing on yesterday. She is currently taking Tylenol and notice improvement with the sore throat     S: Reviewed CMA documentation. I have called patient and gathered additional HPI as follows:  [name] presents via telephone for virtual telemedicine visit.  Reports yesterday she had some right sided sore throat and a slight cough, that has been responsive to acetaminophen.  Reports almost all of her symptoms have resolved and has no concerns for our visit today.  Patient is currently home Denies any high risk travel to areas of current concern for COVID19. Denies any known or suspected exposure to person with or possibly with COVID19.  Past Medical History:  Diagnosis Date  . Complication of anesthesia    pt reports her appetite takes 2-3 weeks to come back after anesthesia.  . Depression   . DJD (degenerative joint disease)   . GERD (gastroesophageal reflux disease)   . Headache 2016   cluster head aches, Head aches have subsided.  . Hypertension   . Hypothyroidism   . Insomnia   . Mitral  valve prolapse 2003   Followed by Dr. Clayborn Bigness (prn)  . PONV (postoperative nausea and vomiting) 2002   With BTL  . Urine frequency    Social History   Tobacco Use  . Smoking status: Never Smoker  . Smokeless tobacco: Never Used  Vaping Use  . Vaping Use: Never used  Substance Use Topics  . Alcohol use: No  . Drug use: No    Current Outpatient Medications:  .  cyclobenzaprine (FLEXERIL) 10 MG tablet, Take 0.5-1 tablets (5-10 mg total) by mouth 3 (three) times daily as needed for muscle spasms., Disp: 30 tablet, Rfl: 0 .  diphenhydrAMINE HCl (BENADRYL ALLERGY PO), Take by mouth., Disp: , Rfl:  .  HYDROcodone-acetaminophen (NORCO/VICODIN) 5-325 MG tablet, Take 1 tablet by mouth 2 (two) times daily as needed. , Disp: , Rfl:  .  omeprazole (PRILOSEC OTC) 20 MG tablet, Take 20 mg by mouth at bedtime., Disp: , Rfl:  .  oxybutynin (DITROPAN XL) 15 MG 24 hr tablet, TAKE 1 TABLET BY MOUTH EVERYDAY AT BEDTIME, Disp: 90 tablet, Rfl: 1 .  zolpidem (AMBIEN) 10 MG tablet, Take 1 tablet (10 mg total) by mouth at bedtime as needed. for sleep, Disp: 30 tablet, Rfl: 2 .  ibuprofen (ADVIL,MOTRIN) 200 MG tablet, Take 800 mg by mouth every 6 (six) hours as needed for headache or moderate pain. (Patient not taking: Reported on 08/12/2020), Disp: , Rfl:   Depression screen [facility] 2/9 03/05/2020 08/25/2019 04/29/2019  Decreased Interest 3 2 3   Down, Depressed, Hopeless 3 1 3   PHQ - 2 Score 6 3 6   Altered  sleeping 3 3 3   Tired, decreased energy 3 1 3   Change in appetite 0 0 3  Feeling bad or failure about yourself  1 0 3  Trouble concentrating 3 0 2  Moving slowly or fidgety/restless 0 0 0  Suicidal thoughts 0 0 0  PHQ-9 Score 16 7 20   Difficult doing work/chores Very difficult Somewhat difficult Extremely dIfficult  Some recent data might be hidden    GAD 7 : Generalized Anxiety Score 08/25/2019 04/29/2019 09/25/2018 06/05/2017  Nervous, Anxious, on Edge 0 0 3 0  Control/stop worrying 0 0 3 0  Worry too  much - different things 0 0 3 0  Trouble relaxing 0 0 0 1  Restless 0 0 0 0  Easily annoyed or irritable 0 0 0 0  Afraid - awful might happen 0 1 0 0  Total GAD 7 Score 0 1 9 1   Anxiety Difficulty - Very difficult Not difficult at all -    -------------------------------------------------------------------------- O: No physical exam performed due to remote telephone encounter.  Physical Exam: Patient remotely monitored without video.  Verbal communication appropriate.  Cognition normal.  Recent Results (from the past 2160 hour(s))  POCT Urinalysis Dipstick     Status: Normal   Collection Time: 06/07/20  2:42 PM  Result Value Ref Range   Color, UA Yellow    Clarity, UA clear    Glucose, UA Negative Negative   Bilirubin, UA negative    Ketones, UA negative    Spec Grav, UA 1.020 1.010 - 1.025   Blood, UA negative    pH, UA 5.0 5.0 - 8.0   Protein, UA Negative Negative   Urobilinogen, UA 0.2 0.2 or 1.0 E.U./dL   Nitrite, UA negative    Leukocytes, UA Negative Negative   Appearance     Odor      -------------------------------------------------------------------------- A&P:  Problem List Items Addressed This Visit      Respiratory   Pharyngitis    Discussed pharyngitis can be caused by seasonal allergies and viruses, but since her symptoms are resolving with a short course of acetaminophen, would continue symptom management at this time.  To return to clinic if symptoms worsen or do not fully resolve.         No orders of the defined types were placed in this encounter.   Follow-up: - Return if symptoms worsen/return  Patient verbalizes understanding with the above medical recommendations including the limitation of remote medical advice.  Specific follow-up and call-back criteria were given for patient to follow-up or seek medical care more urgently if needed.  - Time spent in direct consultation with patient on phone: 5 minutes  Harlin Rain,  [facility] 08/12/2020, 11:33 AM

## 2020-08-12 NOTE — Assessment & Plan Note (Signed)
Discussed pharyngitis can be caused by seasonal allergies and viruses, but since her symptoms are resolving with a short course of acetaminophen, would continue symptom management at this time.  To return to clinic if symptoms worsen or do not fully resolve.

## 2020-08-12 NOTE — Telephone Encounter (Signed)
I called the patient, no answer and left a detail message on the patient vm that her appt is over the phone. No special equipment needed for a virtual visit.

## 2020-08-16 ENCOUNTER — Ambulatory Visit: Payer: 59 | Attending: Internal Medicine

## 2020-08-16 DIAGNOSIS — Z23 Encounter for immunization: Secondary | ICD-10-CM

## 2020-08-16 NOTE — Progress Notes (Signed)
° °  Covid-19 Vaccination Clinic  Name:  Denise Glass    MRN: [id number] DOB: 09/20/1956  08/16/2020  Denise Glass was observed post Covid-19 immunization for 15 minutes without incident. She was provided with Vaccine Information Sheet and instruction to access the V-Safe system.   Denise Glass was instructed to call 911 with any severe reactions post vaccine:  Difficulty breathing   Swelling of face and throat   A fast heartbeat   A bad rash all over body   Dizziness and weakness   Immunizations Administered    Name Date Dose VIS Date Route   JANSSEN COVID-19 VACCINE 08/16/2020  3:37 PM 0.5 mL 08/04/2020 Intramuscular   Manufacturer: [employer]   Lot: [id number]   [place]: [id number]

## 2020-08-18 ENCOUNTER — Other Ambulatory Visit: Payer: Self-pay | Admitting: Family Medicine

## 2020-08-18 DIAGNOSIS — R202 Paresthesia of skin: Secondary | ICD-10-CM

## 2020-09-06 ENCOUNTER — Other Ambulatory Visit: Payer: 59

## 2020-09-06 DIAGNOSIS — Z20822 Contact with and (suspected) exposure to covid-19: Secondary | ICD-10-CM

## 2020-09-07 LAB — SARS-COV-2, NAA 2 DAY TAT

## 2020-09-07 LAB — NOVEL CORONAVIRUS, NAA: SARS-CoV-2, NAA: NOT DETECTED

## 2020-09-14 ENCOUNTER — Ambulatory Visit (INDEPENDENT_AMBULATORY_CARE_PROVIDER_SITE_OTHER): Payer: 59 | Admitting: Family Medicine

## 2020-09-14 ENCOUNTER — Other Ambulatory Visit: Payer: Self-pay

## 2020-09-14 ENCOUNTER — Encounter: Payer: Self-pay | Admitting: Family Medicine

## 2020-09-14 VITALS — BP 118/72 | HR 78 | Resp 17 | Ht 62.0 in | Wt 279.2 lb

## 2020-09-14 DIAGNOSIS — F5104 Psychophysiologic insomnia: Secondary | ICD-10-CM

## 2020-09-14 DIAGNOSIS — F418 Other specified anxiety disorders: Secondary | ICD-10-CM | POA: Diagnosis not present

## 2020-09-14 MED ORDER — ESCITALOPRAM OXALATE 10 MG PO TABS: 10.0000 mg | ORAL_TABLET | Freq: Every day | ORAL | 1 refills | Status: DC

## 2020-09-14 MED ORDER — ZOLPIDEM TARTRATE 10 MG PO TABS: 10.0000 mg | ORAL_TABLET | Freq: Every evening | ORAL | 2 refills | Status: DC | PRN

## 2020-09-14 NOTE — Patient Instructions (Signed)
The following recommendations are helpful adjuncts for helping rebalance your mood.  Eat a nourishing diet. Ensure adequate intake of calories, protein, carbs, fat, vitamins, and minerals. Prioritize whole foods at each meal, including meats, vegetables, fruits, nuts and seeds, etc.   Avoid inflammatory and/or "junk" foods, such as sugar, omega-6 fats, refined grains, chemicals, and preservatives are common in packaged and prepared foods. Minimize or completely avoid these ingredients and stick to whole foods with little to no additives. Cook from scratch as much as possible for more control over what you eat  Get enough sleep. Poor sleep is significantly associated with depression and anxiety. Make 7-9 hours of sleep nightly a top priority  Exercise appropriately. Exercise is known to improve brain functioning and boost mood. Aim for 30 minutes of daily physical activity. Avoid "overtraining," which can cause mental disturbances  Assess your light exposure. Not enough natural light during the day and too much artificial light can have a major impact on your mood. Get outside as often as possible during daylight hours. Minimize light exposure after dark and avoid the use of electronics that give off blue light before bed  Manage your stress.  Use daily stress management techniques such as meditation, yoga, or mindfulness to retrain your brain to respond differently to stress. Try deep breathing to deactivate your "fight or flight" response.  There are many of sources with apps like Headspace, Calm or a variety of YouTube videos (videos from Gwynne Edinger have guided meditation)  Prioritize your social life. Work on building social support with new friends or improve current relationships. Consider getting a pet that allows for companionship, social interaction, and physical touch. Try volunteering or joining a faith-based community to increase your sense of purpose  4-7-8 breathing technique at  bedtime: breathe in to count of 4, hold breath for count of 7, exhale for count of 8; do 3-5 times for letting go of overactive thoughts  Take time to play Unstructured "play" time can help reduce anxiety and depression Options for play include music, games, sports, dance, art, etc.  Try to add daily omega 3 fatty acids, magnesium, B complex, and balanced amino acid supplements to help improve mood and anxiety.  Sleep hygiene is the single most effective treatment for sleep issues, but it is hard work.  Tips for a good night's sleep:  -Keep sleep environment comfortable and conducive to sleep -Keep regular sleep schedule 7 nights a week -Avoiding naps during the day -Avoiding going to bed until drowsy and ready to sleep, not trying to sleep, and not watching the clock -Get out of bed if not asleep within 15-20 minutes and returning only when drowsy -Avoiding caffeine, nicotine, alcohol, and other substances that interfere with sleep before bedtime -Take an hour before your set bedtime and start to wind down: bath/shower, no more TV or phone (the blue light can interfere with sleeping), listen to soothing music, or meditation -No TV in your bedroom -Exercising regularly, at least 6 hours before sleep. Yoga and Tai Chi can improve sleep quality  There are a lot of books and apps that may help guide you with any of the following:   -Progressive muscle relaxation (involves methodical tension and relaxation of different Muscle groups throughout body)  Guided imagery  -YouTube - Gwynne Edinger has free videos on YouTube that can help with meditation and some   Abdominal breathing   Over the counter sleep aid one hour before bed- and gradually wean your use over  2-4 weeks  Some examples are : *Melatonin 5-10 mg *Sleepology (Can find on [employer]) taken according to packaging directions  There are a few online evidence based online programs, unfortunately they are not free.   Developed  by a sleep expert who created a drug-free program for insomnia proven more effective than sleeping pills.  www.cbtforinsomnia.com Sleepio is an evidence-based digital sleep improvement program   www.sleepio.com SHUTi is designed to actively help retrain your body and mind for great sleep through six engaging Cognitive Behavioral Therapy for Insomnia strategy and learning sessions  [email or web address]  We will plan to see you back in 4 weeks for anxiety and insomnia follow up visit  You will receive a survey after today's visit either digitally by e-mail or paper by [place] mail. Your experiences and feedback matter to [place].  Please respond so we know how we are doing as we provide care for you.  Call us with any questions/concerns/needs.  It is my goal to be available to you for your health concerns.  Thanks for choosing me to be a partner in your healthcare needs!  Harlin Rain, FNP-C Family Nurse Practitioner [facility] Phone: [phone]

## 2020-09-14 NOTE — Progress Notes (Signed)
Subjective:    Patient ID: Denise Glass, female    DOB: [birth year], 64 y.o.   MRN: [id number]  Denise Glass is a 64 y.o. female presenting on 09/14/2020 for Depression   HPI  Denise Glass presents to clinic for concerns of continued anxiety/depression following the unexpected loss of her partner from 2 years ago.  Reports she finds herself crying most days, throughout the day, and is eager to return to "normal" life.  Is working through the grief process but believes would benefit from some assistance with this.  She has been reaching out to find a psychiatry provider that will take her insurance, reports may have better opportunities for this after the beginning of the new year, when her insurance changes from [facility] to Medicare.  Depression screen [facility] 2/9 03/05/2020 08/25/2019 04/29/2019  Decreased Interest 3 2 3   Down, Depressed, Hopeless 3 1 3   PHQ - 2 Score 6 3 6   Altered sleeping 3 3 3   Tired, decreased energy 3 1 3   Change in appetite 0 0 3  Feeling bad or failure about yourself  1 0 3  Trouble concentrating 3 0 2  Moving slowly or fidgety/restless 0 0 0  Suicidal thoughts 0 0 0  PHQ-9 Score 16 7 20   Difficult doing work/chores Very difficult Somewhat difficult Extremely dIfficult  Some recent data might be hidden    Social History   Tobacco Use  . Smoking status: Never Smoker  . Smokeless tobacco: Never Used  Vaping Use  . Vaping Use: Never used  Substance Use Topics  . Alcohol use: No  . Drug use: No    Review of Systems  Constitutional: Negative.   HENT: Negative.   Eyes: Negative.   Respiratory: Negative.   Cardiovascular: Negative.   Gastrointestinal: Negative.   Endocrine: Negative.   Genitourinary: Negative.   Musculoskeletal: Negative.   Skin: Negative.   Allergic/Immunologic: Negative.   Neurological: Negative.   Hematological: Negative.   Psychiatric/Behavioral: Positive for dysphoric mood and sleep disturbance. Negative for agitation,  behavioral problems, confusion, decreased concentration, hallucinations, self-injury and suicidal ideas. The patient is nervous/anxious. The patient is not hyperactive.    Per HPI unless specifically indicated above     Objective:    BP 118/72 (BP Location: Left Arm, Patient Position: Sitting, Cuff Size: Large)   Pulse 78   Resp 17   Ht 5\' 2"  (1.575 m)   Wt 279 lb 3.2 oz (126.6 kg)   BMI 51.07 kg/m   Wt Readings from Last 3 Encounters:  09/14/20 279 lb 3.2 oz (126.6 kg)  06/07/20 278 lb 9.6 oz (126.4 kg)  05/05/20 278 lb 3.2 oz (126.2 kg)    Physical Exam Vitals and nursing note reviewed.  Constitutional:      General: She is not in acute distress.    Appearance: Normal appearance. She is well-developed and well-groomed. She is morbidly obese. She is not ill-appearing or toxic-appearing.  HENT:     Head: Normocephalic and atraumatic.     Nose:     Comments: Denise Glass is in place, covering mouth and nose. Eyes:     General: Lids are normal. Vision grossly intact.        Right eye: No discharge.        Left eye: No discharge.     Extraocular Movements: Extraocular movements intact.     Conjunctiva/sclera: Conjunctivae normal.     Pupils: Pupils are equal, round, and reactive to light.  Cardiovascular:  Rate and Rhythm: Normal rate and regular rhythm.     Pulses: Normal pulses.     Heart sounds: Normal heart sounds. No murmur heard.  No friction rub. No gallop.   Pulmonary:     Effort: Pulmonary effort is normal. No respiratory distress.     Breath sounds: Normal breath sounds.  Skin:    General: Skin is warm and dry.     Capillary Refill: Capillary refill takes less than 2 seconds.  Neurological:     General: No focal deficit present.     Mental Status: She is alert and oriented to person, place, and time.  Psychiatric:        Attention and Perception: Attention and perception normal.        Mood and Affect: Mood is anxious and depressed. Affect is tearful.         Speech: Speech normal.        Behavior: Behavior normal. Behavior is cooperative.        Thought Content: Thought content normal.        Cognition and Memory: Cognition and memory normal.        Judgment: Judgment normal.    Results for orders placed or performed in visit on 09/06/20  Novel Coronavirus, NAA ([facility])   Specimen: Nasopharyngeal(NP) swabs in vial transport medium   Nasopharynge  Screenin  Result Value Ref Range   SARS-CoV-2, NAA Not Detected Not Detected  SARS-COV-2, NAA 2 DAY TAT   Nasopharynge  Screenin  Result Value Ref Range   SARS-CoV-2, NAA 2 DAY TAT Performed       Assessment & Plan:   Problem List Items Addressed This Visit      Other   Insomnia   Relevant Medications   zolpidem (AMBIEN) 10 MG tablet   Anxiety - Primary    Anxiety with depression, complicated grief with the loss of partner > 2 years ago.  Was unexpected and patient had found partner had passed away by surprise.  Has not been able to move past this, finds herself crying most days, throughout the day.  Is interested in starting on a daily medication to help with her grief process.  Reports her insurance is changing as of the beginning of the year and hopes this will improve her access to care and will be working towards establishing with a local psychiatry provider.  Plan: 1. Begin escitalopram 10mg  daily 2. Review mood handout 3. RTC in 4 weeks, sooner, if needed      Relevant Medications   escitalopram (LEXAPRO) 10 MG tablet      Meds ordered this encounter  Medications  . zolpidem (AMBIEN) 10 MG tablet    Sig: Take 1 tablet (10 mg total) by mouth at bedtime as needed. for sleep    Dispense:  30 tablet    Refill:  2    Not to exceed 3 additional fills before 02/17/2020.  Marland Kitchen escitalopram (LEXAPRO) 10 MG tablet    Sig: Take 1 tablet (10 mg total) by mouth daily.    Dispense:  30 tablet    Refill:  1    Follow up plan: Return in about 4 weeks (around 10/12/2020) for Anxiety  follow up visit.   Harlin Rain, [place] Family Nurse Practitioner [facility] 09/14/2020, 4:12 PM

## 2020-09-14 NOTE — Assessment & Plan Note (Signed)
Anxiety with depression, complicated grief with the loss of partner > 2 years ago.  Was unexpected and patient had found partner had passed away by surprise.  Has not been able to move past this, finds herself crying most days, throughout the day.  Is interested in starting on a daily medication to help with her grief process.  Reports her insurance is changing as of the beginning of the year and hopes this will improve her access to care and will be working towards establishing with a local psychiatry provider.  Plan: 1. Begin escitalopram 10mg  daily 2. Review mood handout 3. RTC in 4 weeks, sooner, if needed

## 2020-10-06 ENCOUNTER — Other Ambulatory Visit: Payer: Self-pay | Admitting: Family Medicine

## 2020-10-06 DIAGNOSIS — F418 Other specified anxiety disorders: Secondary | ICD-10-CM

## 2020-10-17 ENCOUNTER — Other Ambulatory Visit: Payer: Self-pay

## 2020-10-17 ENCOUNTER — Encounter: Payer: Self-pay | Admitting: Emergency Medicine

## 2020-10-17 ENCOUNTER — Emergency Department
Admission: EM | Admit: 2020-10-17 | Discharge: 2020-10-17 | Disposition: A | Discharge status: Home / Self Care | Payer: Medicare Other | Attending: Emergency Medicine | Admitting: Emergency Medicine

## 2020-10-17 DIAGNOSIS — R112 Nausea with vomiting, unspecified: Secondary | ICD-10-CM | POA: Insufficient documentation

## 2020-10-17 DIAGNOSIS — R110 Nausea: Secondary | ICD-10-CM | POA: Diagnosis not present

## 2020-10-17 DIAGNOSIS — R42 Dizziness and giddiness: Secondary | ICD-10-CM | POA: Diagnosis not present

## 2020-10-17 DIAGNOSIS — Z5321 Procedure and treatment not carried out due to patient leaving prior to being seen by health care provider: Secondary | ICD-10-CM | POA: Diagnosis not present

## 2020-10-17 DIAGNOSIS — R0902 Hypoxemia: Secondary | ICD-10-CM | POA: Diagnosis not present

## 2020-10-17 MED ORDER — ONDANSETRON 4 MG PO TBDP
8.0000 mg | ORAL_TABLET | Freq: Once | ORAL | Status: AC
  Administered 2020-10-17: 8 mg via ORAL
  Filled 2020-10-17: qty 2

## 2020-10-17 MED ORDER — MECLIZINE HCL 25 MG PO TABS
50.0000 mg | ORAL_TABLET | Freq: Once | ORAL | Status: AC
  Administered 2020-10-17: 50 mg via ORAL
  Filled 2020-10-17: qty 2

## 2020-10-17 NOTE — ED Triage Notes (Signed)
Pt to ED via [facility] from home for dizziness. Pt states that she laid down to take a nap at 1310, pt reports that when she woke up at 1500 she felt like she was going to vomit. Pt reports that he has had vertigo in the past. Pt states that it feels like the room is spinning and that she is "pulling to the right". No slurred speech noted. Pt was given Zofran by EMS PTA. Pt dry heaving in triage.

## 2020-10-17 NOTE — ED Triage Notes (Addendum)
FIRST NURSE NOTE: Vertigo since 1500 today, worse today, feels like whole world spinning, + N/V  Hx overactive bladder  4mg  Zofran 22g R hand  NSR 12 lead unremark.  CBG 169

## 2020-10-17 NOTE — ED Notes (Signed)
Pt leaving ED, daughter coming to pick her up, pt reports feeling better.  IV access removed.

## 2020-10-17 NOTE — ED Notes (Signed)
Called for lab to come down and get blood from patient. Lab stated that they only had "one phlebotomists on the floor and that I should find some one here in triage that could collect the blood."

## 2020-10-18 ENCOUNTER — Telehealth: Payer: Self-pay | Admitting: Family Medicine

## 2020-10-18 NOTE — Telephone Encounter (Signed)
Pt states her thrush is just not going away and would like a refill of the nystatin (MYCOSTATIN) 100000 UNIT/ML suspension   [id number] - [place], [place] - [address] Phone:  [phone]  Fax:  [phone]     Pt states she spoke to [place] the other day about this issue

## 2020-10-18 NOTE — Telephone Encounter (Signed)
Please review for refill. Medication is not on current med list.  Last OV: 08/3020.

## 2020-10-19 ENCOUNTER — Other Ambulatory Visit: Payer: Self-pay | Admitting: Family Medicine

## 2020-10-19 DIAGNOSIS — B370 Candidal stomatitis: Secondary | ICD-10-CM

## 2020-10-19 NOTE — Telephone Encounter (Signed)
Requested medication (s) are due for refill today: -  Requested medication (s) are on the active medication list: no  Last refill:  04/01/20  Future visit scheduled: no  Notes to clinic:  historical med and provider- med not assigned   Requested Prescriptions  Pending Prescriptions Disp Refills   nystatin (MYCOSTATIN) 100000 UNIT/ML suspension [Pharmacy Med Name: NYSTATIN 100,000 UNIT/ML SUSP] 60 mL 0    Sig: Take 5 mLs (500,000 Units total) by mouth 4 (four) times daily.      Off-Protocol Failed - 10/19/2020 10:15 AM      Failed - Medication not assigned to a protocol, review manually.      Passed - Valid encounter within last 12 months    Recent Outpatient Visits           1 month ago Anxiety with depression   [facility], Jodelle Gross, FNP   2 months ago Pharyngitis, unspecified etiology   [facility], Jodelle Gross, FNP   4 months ago Acute otitis externa of right ear, unspecified type   [facility], Jodelle Gross, FNP   5 months ago Hordeolum externum of right lower eyelid   [facility], Jodelle Gross, FNP   7 months ago Muscle spasms of neck   [facility], Jodelle Gross, [place]

## 2020-10-25 ENCOUNTER — Other Ambulatory Visit: Payer: Self-pay

## 2020-10-25 DIAGNOSIS — Z20822 Contact with and (suspected) exposure to covid-19: Secondary | ICD-10-CM | POA: Diagnosis not present

## 2020-10-26 LAB — NOVEL CORONAVIRUS, NAA: SARS-CoV-2, NAA: NOT DETECTED

## 2020-10-26 LAB — SARS-COV-2, NAA 2 DAY TAT

## 2020-10-28 DIAGNOSIS — H9041 Sensorineural hearing loss, unilateral, right ear, with unrestricted hearing on the contralateral side: Secondary | ICD-10-CM | POA: Diagnosis not present

## 2020-10-28 DIAGNOSIS — R42 Dizziness and giddiness: Secondary | ICD-10-CM | POA: Diagnosis not present

## 2020-10-28 DIAGNOSIS — H9311 Tinnitus, right ear: Secondary | ICD-10-CM | POA: Diagnosis not present

## 2020-11-10 DIAGNOSIS — M792 Neuralgia and neuritis, unspecified: Secondary | ICD-10-CM | POA: Diagnosis not present

## 2020-11-10 DIAGNOSIS — Z79891 Long term (current) use of opiate analgesic: Secondary | ICD-10-CM | POA: Diagnosis not present

## 2020-11-10 DIAGNOSIS — G894 Chronic pain syndrome: Secondary | ICD-10-CM | POA: Diagnosis not present

## 2020-11-10 DIAGNOSIS — Z79899 Other long term (current) drug therapy: Secondary | ICD-10-CM | POA: Diagnosis not present

## 2020-12-08 ENCOUNTER — Other Ambulatory Visit: Payer: Self-pay

## 2020-12-08 DIAGNOSIS — F5104 Psychophysiologic insomnia: Secondary | ICD-10-CM

## 2020-12-08 MED ORDER — ZOLPIDEM TARTRATE 10 MG PO TABS: 10.0000 mg | ORAL_TABLET | Freq: Every evening | ORAL | 2 refills | Status: DC | PRN

## 2020-12-08 MED ORDER — ZOLPIDEM TARTRATE 10 MG PO TABS
10.0000 mg | ORAL_TABLET | Freq: Every evening | ORAL | 2 refills | Status: DC | PRN
Start: 2020-12-08 — End: 2021-08-16

## 2020-12-08 NOTE — Addendum Note (Signed)
Addended by: Olin Hauser on: 12/08/2020 05:54 PM   Modules accepted: Orders

## 2020-12-09 ENCOUNTER — Other Ambulatory Visit: Payer: Self-pay

## 2020-12-14 ENCOUNTER — Other Ambulatory Visit: Payer: Self-pay | Admitting: Family Medicine

## 2020-12-14 DIAGNOSIS — N3281 Overactive bladder: Secondary | ICD-10-CM

## 2020-12-14 DIAGNOSIS — R350 Frequency of micturition: Secondary | ICD-10-CM

## 2020-12-14 DIAGNOSIS — N3942 Incontinence without sensory awareness: Secondary | ICD-10-CM

## 2021-02-04 DIAGNOSIS — Z79899 Other long term (current) drug therapy: Secondary | ICD-10-CM | POA: Diagnosis not present

## 2021-02-04 DIAGNOSIS — G894 Chronic pain syndrome: Secondary | ICD-10-CM | POA: Diagnosis not present

## 2021-02-04 DIAGNOSIS — M792 Neuralgia and neuritis, unspecified: Secondary | ICD-10-CM | POA: Diagnosis not present

## 2021-03-30 ENCOUNTER — Other Ambulatory Visit: Payer: Self-pay

## 2021-03-30 DIAGNOSIS — F418 Other specified anxiety disorders: Secondary | ICD-10-CM

## 2021-04-19 ENCOUNTER — Other Ambulatory Visit: Payer: Self-pay

## 2021-04-19 ENCOUNTER — Encounter: Payer: Self-pay | Admitting: Internal Medicine

## 2021-04-19 ENCOUNTER — Ambulatory Visit (INDEPENDENT_AMBULATORY_CARE_PROVIDER_SITE_OTHER): Payer: Medicare Other | Admitting: Internal Medicine

## 2021-04-19 VITALS — BP 111/77 | HR 83 | Temp 97.3°F | Resp 16 | Ht 62.0 in | Wt 276.6 lb

## 2021-04-19 DIAGNOSIS — N952 Postmenopausal atrophic vaginitis: Secondary | ICD-10-CM

## 2021-04-19 DIAGNOSIS — F419 Anxiety disorder, unspecified: Secondary | ICD-10-CM

## 2021-04-19 DIAGNOSIS — F5104 Psychophysiologic insomnia: Secondary | ICD-10-CM

## 2021-04-19 DIAGNOSIS — K219 Gastro-esophageal reflux disease without esophagitis: Secondary | ICD-10-CM | POA: Diagnosis not present

## 2021-04-19 DIAGNOSIS — R7303 Prediabetes: Secondary | ICD-10-CM | POA: Insufficient documentation

## 2021-04-19 DIAGNOSIS — I471 Supraventricular tachycardia: Secondary | ICD-10-CM

## 2021-04-19 DIAGNOSIS — E782 Mixed hyperlipidemia: Secondary | ICD-10-CM | POA: Diagnosis not present

## 2021-04-19 DIAGNOSIS — E039 Hypothyroidism, unspecified: Secondary | ICD-10-CM | POA: Diagnosis not present

## 2021-04-19 DIAGNOSIS — F418 Other specified anxiety disorders: Secondary | ICD-10-CM

## 2021-04-19 DIAGNOSIS — I10 Essential (primary) hypertension: Secondary | ICD-10-CM

## 2021-04-19 DIAGNOSIS — F331 Major depressive disorder, recurrent, moderate: Secondary | ICD-10-CM

## 2021-04-19 MED ORDER — ESCITALOPRAM OXALATE 10 MG PO TABS: 10.0000 mg | ORAL_TABLET | Freq: Every day | ORAL | 1 refills | Status: DC

## 2021-04-19 NOTE — Assessment & Plan Note (Signed)
Controlled off meds Reinforced DASH diet and exercise for weight loss 

## 2021-04-19 NOTE — Assessment & Plan Note (Signed)
Try to avoid foods that trigger reflux Encourage weight loss as this can help reduce reflux symptoms Continue Omeprazole CBC and c-Met today

## 2021-04-19 NOTE — Assessment & Plan Note (Signed)
Now symptomatic TSH today She will follow-up with cardiology on Friday regarding this

## 2021-04-19 NOTE — Assessment & Plan Note (Signed)
A1c today Encourage low-carb diet and exercise for weight loss 

## 2021-04-19 NOTE — Progress Notes (Signed)
Subjective:    Patient ID: Denise Glass, female    DOB: 20-Mar-1956, 65 y.o.   MRN: [id number]  HPI  Pt presents to the clinic today for follow up of chronic conditions. She is establishing care with me today, transferring care from Cyndia Skeeters , NP.  Atrial Tachycardia: Intermittent but worse, now with SOB with exertion.. She is not currently taking any medications for this but she is following up with cardiologist on Friday.  ECG from 10/2020 reviewed.  HTN: Her BP today is 111/77.  She is not currently taking any oral antihypertensive medications at this time.  HLD: Her last LDL was 151, triglycerides 156, 05/2017.  She is not taking any cholesterol-lowering medication at this time.  She does not consume a low-fat diet.  GERD: She is not sure what triggers this. She denies breakthrough on Omeprazole.  There is no upper GI on file.  Insomnia: She has difficulty falling asleep.  She is taking Ambien as prescribed.  There is no sleep study on file.  Anxiety and Depression: Chronic, deteriorated since she stopped taking Escitalopram.  She is not currently seeing a therapist.  She denies SI/HI.  Hypothyroidism: She is not taking any medication for her thyroid at this time.  She does not follow with endocrinology.  OAB: She is taking Ditropan as prescribed.  She does not follow with urology.  Prediabetes: Her last A1c was 5.9%, 02/2020.  She is not taking any oral diabetic medication at this time.  She does not check her sugars.  Review of Systems     Past Medical History:  Diagnosis Date   Complication of anesthesia    pt reports her appetite takes 2-3 weeks to come back after anesthesia.   Depression    DJD (degenerative joint disease)    GERD (gastroesophageal reflux disease)    Headache 2016   cluster head aches, Head aches have subsided.   Hypertension    Hypothyroidism    Insomnia    Mitral valve prolapse 2003   Followed by Dr. Clayborn Bigness (prn)   PONV (postoperative  nausea and vomiting) 2002   With BTL   Urine frequency     Current Outpatient Medications  Medication Sig Dispense Refill   diphenhydrAMINE HCl (BENADRYL ALLERGY PO) Take by mouth.     escitalopram (LEXAPRO) 10 MG tablet Take 1 tablet (10 mg total) by mouth daily. 30 tablet 1   ibuprofen (ADVIL,MOTRIN) 200 MG tablet Take 800 mg by mouth every 6 (six) hours as needed for headache or moderate pain.      omeprazole (PRILOSEC OTC) 20 MG tablet Take 20 mg by mouth at bedtime.     oxybutynin (DITROPAN XL) 15 MG 24 hr tablet TAKE 1 TABLET BY MOUTH EVERYDAY AT BEDTIME 90 tablet 0   zolpidem (AMBIEN) 10 MG tablet Take 1 tablet (10 mg total) by mouth at bedtime as needed. for sleep 30 tablet 2   No current facility-administered medications for this visit.    Allergies  Allergen Reactions   Prednazoline Other (See Comments)   Prednisone    Sulfa Antibiotics Other (See Comments)    Unknown    Family History  Problem Relation Age of Onset   Heart disease Father    COPD Father    Cancer Father        throat   COPD Mother    Heart disease Mother    Stroke Mother    Healthy Brother    Healthy Son  Drug abuse Brother     Social History   Socioeconomic History   Marital status: Widowed    Spouse name: Not on file   Number of children: Not on file   Years of education: Not on file   Highest education level: Not on file  Occupational History   Occupation: retired  Tobacco Use   Smoking status: Never   Smokeless tobacco: Never  Vaping Use   Vaping Use: Never used  Substance and Sexual Activity   Alcohol use: No   Drug use: No   Sexual activity: Not Currently  Other Topics Concern   Not on file  Social History Narrative   Patient is a 65 y/o widow (2011) who lives independently at home. 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 She has one son, a daughter-in-law and a grandson that live locally.  She struggles with her relationships with  mother and brother.   Social Determinants of Health   Financial Resource Strain: Not on file  Food Insecurity: Not on file  Transportation Needs: Not on file  Physical Activity: Not on file  Stress: Not on file  Social Connections: Not on file  Intimate Partner Violence: Not on file     Constitutional: Denies fever, malaise, fatigue, headache or abrupt weight changes.  HEENT: Denies eye pain, eye redness, ear pain, ringing in the ears, wax buildup, runny nose, nasal congestion, bloody nose, or sore throat. Respiratory: Denies difficulty breathing, shortness of breath, cough or sputum production.   Cardiovascular: Denies chest pain, chest tightness, palpitations or swelling in the hands or feet.  Gastrointestinal: Denies abdominal pain, bloating, constipation, diarrhea or blood in the stool.  GU: Denies urgency, frequency, pain with urination, burning sensation, blood in urine, odor or discharge. Musculoskeletal: Denies decrease in range of motion, difficulty with gait, muscle pain or joint pain and swelling.  Skin: Denies redness, rashes, lesions or ulcercations.  Neurological: Patient reports insomnia.  Denies dizziness, difficulty with memory, difficulty with speech or problems with balance and coordination.  Psych: Patient has a history of anxiety and depression.  Denies SI/HI.  No other specific complaints in a complete review of systems (except as listed in HPI above).  Objective:   Physical Exam  BP 111/77   Pulse 83   Temp (!) 97.3 F (36.3 C) (Temporal)   Resp 16   Ht 5\' 2"  (1.575 m)   Wt 276 lb 9.6 oz (125.5 kg)   SpO2 98%   BMI 50.59 kg/m   Wt Readings from Last 3 Encounters:  10/17/20 270 lb (122.5 kg)  09/14/20 279 lb 3.2 oz (126.6 kg)  06/07/20 278 lb 9.6 oz (126.4 kg)    General: Appears her stated age, obese, in NAD. Skin: Warm, dry and intact. No ulcerations noted. HEENT: Head: normal shape and size; Eyes: sclera white PERRLA and EOMs intact;  Neck:   Neck supple, trachea midline. No masses, lumps or thyromegaly present.  Cardiovascular: Normal rate and rhythm. S1,S2 noted.  No murmur, rubs or gallops noted. No JVD or BLE edema. No carotid bruits noted. Pulmonary/Chest: Normal effort and positive vesicular breath sounds. No respiratory distress. No wheezes, rales or ronchi noted.  Abdomen: Normal bowel sounds.  Musculoskeletal: Gait slow and steady without device.  Neurological: Alert and oriented.  Psychiatric: Mood and affect normal. Behavior is normal. Judgment and thought content normal.     BMET    Component Value Date/Time   NA 140 03/19/2020 0022  NA 138 11/14/2019 1451   NA 140 01/17/2015 1431   K 3.6 03/19/2020 0022   K 4.5 01/17/2015 1431   CL 106 03/19/2020 0022   CL 104 01/17/2015 1431   CO2 24 03/19/2020 0022   CO2 29 01/17/2015 1431   GLUCOSE 123 (H) 03/19/2020 0022   GLUCOSE 107 (H) 01/17/2015 1431   BUN 10 03/19/2020 0022   BUN 13 11/14/2019 1451   BUN 13 01/17/2015 1431   CREATININE 0.85 03/19/2020 0022   CREATININE 0.82 05/31/2017 0837   CALCIUM 9.1 03/19/2020 0022   CALCIUM 10.2 01/17/2015 1431   GFRNONAA >60 03/19/2020 0022   GFRNONAA >60 01/17/2015 1431   GFRAA >60 03/19/2020 0022   GFRAA >60 01/17/2015 1431    Lipid Panel     Component Value Date/Time   CHOL 238 (H) 05/31/2017 0837   TRIG 156 (H) 05/31/2017 0837   HDL 56 05/31/2017 0837   CHOLHDL 4.3 05/31/2017 0837   VLDL 31 (H) 05/31/2017 0837   LDLCALC 151 (H) 05/31/2017 0837    CBC    Component Value Date/Time   WBC 10.5 03/19/2020 0022   RBC 4.96 03/19/2020 0022   HGB 14.4 03/19/2020 0022   HGB 15.3 11/14/2019 1451   HCT 43.8 03/19/2020 0022   HCT 44.6 11/14/2019 1451   PLT 369 03/19/2020 0022   PLT 343 11/14/2019 1451   MCV 88.3 03/19/2020 0022   MCV 88 11/14/2019 1451   MCV 89 01/17/2015 1431   MCH 29.0 03/19/2020 0022   MCHC 32.9 03/19/2020 0022   RDW 13.9 03/19/2020 0022   RDW 13.3 11/14/2019 1451   RDW 13.5  01/17/2015 1431   LYMPHSABS 1.9 11/14/2019 1451   MONOABS 528 05/31/2017 0837   EOSABS 0.2 11/14/2019 1451   BASOSABS 0.0 11/14/2019 1451    Hgb A1C Lab Results  Component Value Date   HGBA1C 5.9 (A) 03/05/2020           Assessment & Plan:     Webb Silversmith, NP This visit occurred during the SARS-CoV-2 public health emergency.  Safety protocols were in place, including screening questions prior to the visit, additional usage of staff PPE, and extensive cleaning of exam room while observing appropriate contact time as indicated for disinfecting solutions.

## 2021-04-19 NOTE — Assessment & Plan Note (Signed)
Encourage low-carb diet and exercise weight loss

## 2021-04-19 NOTE — Patient Instructions (Signed)
Heart-Healthy Eating Plan Heart-healthy meal planning includes: Eating less unhealthy fats. Eating more healthy fats. Making other changes in your diet. Talk with your doctor or a diet specialist (dietitian) to create an eating plan that is right for you. What is my plan? Your doctor may recommend an eating plan that includes: Total fat: ______% or less of total calories a day. Saturated fat: ______% or less of total calories a day. Cholesterol: less than _________mg a day. What are tips for following this plan? Cooking Avoid frying your food. Try to bake, boil, grill, or broil it instead. You can also reduce fat by: Removing the skin from poultry. Removing all visible fats from meats. Steaming vegetables in water or broth. Meal planning  At meals, divide your plate into four equal parts: Fill one-half of your plate with vegetables and green salads. Fill one-fourth of your plate with whole grains. Fill one-fourth of your plate with lean protein foods. Eat 4-5 servings of vegetables per day. A serving of vegetables is: 1 cup of raw or cooked vegetables. 2 cups of raw leafy greens. Eat 4-5 servings of fruit per day. A serving of fruit is: 1 medium whole fruit.  cup of dried fruit.  cup of fresh, frozen, or canned fruit.  cup of 100% fruit juice. Eat more foods that have soluble fiber. These are apples, broccoli, carrots, beans, peas, and barley. Try to get 20-30 g of fiber per day. Eat 4-5 servings of nuts, legumes, and seeds per week: 1 serving of dried beans or legumes equals  cup after being cooked. 1 serving of nuts is  cup. 1 serving of seeds equals 1 tablespoon.  General information Eat more home-cooked food. Eat less restaurant, buffet, and fast food. Limit or avoid alcohol. Limit foods that are high in starch and sugar. Avoid fried foods. Lose weight if you are overweight. Keep track of how much salt (sodium) you eat. This is important if you have high blood  pressure. Ask your doctor to tell you more about this. Try to add vegetarian meals each week. Fats Choose healthy fats. These include olive oil and canola oil, flaxseeds, walnuts, almonds, and seeds. Eat more omega-3 fats. These include salmon, mackerel, sardines, tuna, flaxseed oil, and ground flaxseeds. Try to eat fish at least 2 times each week. Check food labels. Avoid foods with trans fats or high amounts of saturated fat. Limit saturated fats. These are often found in animal products, such as meats, butter, and cream. These are also found in plant foods, such as palm oil, palm kernel oil, and coconut oil. Avoid foods with partially hydrogenated oils in them. These have trans fats. Examples are stick margarine, some tub margarines, cookies, crackers, and other baked goods. What foods can I eat? Fruits All fresh, canned (in natural juice), or frozen fruits. Vegetables Fresh or frozen vegetables (raw, steamed, roasted, or grilled). Green salads. Grains Most grains. Choose whole wheat and whole grains most of the time. Rice andpasta, including brown rice and pastas made with whole wheat. Meats and other proteins Lean, well-trimmed beef, veal, pork, and lamb. Chicken and turkey without skin. All fish and shellfish. Wild duck, rabbit, pheasant, and venison. Egg whites or low-cholesterol egg substitutes. Dried beans, peas, lentils, and tofu. Seedsand most nuts. Dairy Low-fat or nonfat cheeses, including ricotta and mozzarella. Skim or 1% milk that is liquid, powdered, or evaporated. Buttermilk that is made with low-fatmilk. Nonfat or low-fat yogurt. Fats and oils Non-hydrogenated (trans-free) margarines. Vegetable oils, including soybean, sesame,   sunflower, olive, peanut, safflower, corn, canola, and cottonseed. Salad dressings or mayonnaisemade with a vegetable oil. Beverages Mineral water. Coffee and tea. Diet carbonated beverages. Sweets and desserts Sherbet, gelatin, and fruit ice. Small  amounts of dark chocolate. Limit all sweets and desserts. Seasonings and condiments All seasonings and condiments. The items listed above may not be a complete list of foods and drinks you can eat. Contact a dietitian for more options. What foods should I avoid? Fruits Canned fruit in heavy syrup. Fruit in cream or butter sauce. Fried fruit. Limitcoconut. Vegetables Vegetables cooked in cheese, cream, or butter sauce. Fried vegetables. Grains Breads that are made with saturated or trans fats, oils, or whole milk. Croissants. Sweet rolls. Donuts. High-fat crackers,such as cheese crackers. Meats and other proteins Fatty meats, such as hot dogs, ribs, sausage, bacon, rib-eye roast or steak. High-fat deli meats, such as salami and bologna. Caviar. Domestic duck andgoose. Organ meats, such as liver. Dairy Cream, sour cream, cream cheese, and creamed cottage cheese. Whole-milk cheeses. Whole or 2% milk that is liquid, evaporated, or condensed. Whole buttermilk. Cream sauce or high-fat cheese sauce. Yogurt that is made fromwhole milk. Fats and oils Meat fat, or shortening. Cocoa butter, hydrogenated oils, palm oil, coconut oil, palm kernel oil. Solid fats and shortenings, including bacon fat, salt pork, lard, and butter. Nondairy cream substitutes. Salad dressings with cheeseor sour cream. Beverages Regular sodas and juice drinks with added sugar. Sweets and desserts Frosting. Pudding. Cookies. Cakes. Pies. Milk chocolate or white chocolate.Buttered syrups. Full-fat ice cream or ice cream drinks. The items listed above may not be a complete list of foods and drinks to avoid. Contact a dietitian for more information. Summary Heart-healthy meal planning includes eating less unhealthy fats, eating more healthy fats, and making other changes in your diet. Eat a balanced diet. This includes fruits and vegetables, low-fat or nonfat dairy, lean protein, nuts and legumes, whole grains, and heart-healthy  oils and fats. This information is not intended to replace advice given to you by your health care provider. Make sure you discuss any questions you have with your healthcare provider. Document Revised: 12/06/2017 Document Reviewed: 11/09/2017 Elsevier Patient Education  2022 Elsevier Inc.  

## 2021-04-19 NOTE — Assessment & Plan Note (Signed)
Will restart Escitalopram Support offered

## 2021-04-19 NOTE — Assessment & Plan Note (Signed)
Continue Ambien. 

## 2021-04-19 NOTE — Assessment & Plan Note (Signed)
Continue Escitalopram Support offered

## 2021-04-19 NOTE — Assessment & Plan Note (Signed)
TSH today Not currently medicated

## 2021-04-19 NOTE — Assessment & Plan Note (Signed)
C-Met and lipid profile today Encouraged her to consume a low-fat diet 

## 2021-04-20 ENCOUNTER — Other Ambulatory Visit: Payer: Self-pay

## 2021-04-20 LAB — COMPREHENSIVE METABOLIC PANEL
AG Ratio: 1.4 (calc) (ref 1.0–2.5)
ALT: 24 U/L (ref 6–29)
AST: 22 U/L (ref 10–35)
Albumin: 4.2 g/dL (ref 3.6–5.1)
Alkaline phosphatase (APISO): 103 U/L (ref 37–153)
BUN: 10 mg/dL (ref 7–25)
CO2: 24 mmol/L (ref 20–32)
Calcium: 9.7 mg/dL (ref 8.6–10.4)
Chloride: 103 mmol/L (ref 98–110)
Creat: 0.76 mg/dL (ref 0.50–0.99)
Globulin: 2.9 g/dL (calc) (ref 1.9–3.7)
Glucose, Bld: 127 mg/dL — ABNORMAL HIGH (ref 65–99)
Potassium: 4.1 mmol/L (ref 3.5–5.3)
Sodium: 138 mmol/L (ref 135–146)
Total Bilirubin: 0.5 mg/dL (ref 0.2–1.2)
Total Protein: 7.1 g/dL (ref 6.1–8.1)

## 2021-04-20 LAB — LIPID PANEL
Cholesterol: 224 mg/dL — ABNORMAL HIGH (ref <200)
HDL: 66 mg/dL (ref >=50)
LDL Cholesterol (Calc): 130 mg/dL (calc) — ABNORMAL HIGH
Non-HDL Cholesterol (Calc): 158 mg/dL (calc) — ABNORMAL HIGH (ref <130)
Total CHOL/HDL Ratio: 3.4 (calc) (ref <5.0)
Triglycerides: 160 mg/dL — ABNORMAL HIGH (ref <150)

## 2021-04-20 LAB — HEMOGLOBIN A1C
Hgb A1c MFr Bld: 5.9 % of total Hgb — ABNORMAL HIGH (ref <5.7)
Mean Plasma Glucose: 123 mg/dL
eAG (mmol/L): 6.8 mmol/L

## 2021-04-20 LAB — TSH: TSH: 3.79 mIU/L (ref 0.40–4.50)

## 2021-04-20 MED ORDER — SIMVASTATIN 10 MG PO TABS: 10.0000 mg | ORAL_TABLET | Freq: Every day | ORAL | 2 refills | Status: DC

## 2021-04-22 ENCOUNTER — Other Ambulatory Visit: Payer: Self-pay

## 2021-04-22 ENCOUNTER — Encounter: Payer: Self-pay | Admitting: Cardiovascular Disease

## 2021-04-22 ENCOUNTER — Ambulatory Visit: Payer: Medicare Other | Admitting: Cardiovascular Disease

## 2021-04-22 VITALS — BP 139/80 | HR 76 | Ht 62.0 in | Wt 276.2 lb

## 2021-04-22 DIAGNOSIS — R0602 Shortness of breath: Secondary | ICD-10-CM | POA: Diagnosis not present

## 2021-04-22 DIAGNOSIS — I479 Paroxysmal tachycardia, unspecified: Secondary | ICD-10-CM

## 2021-04-22 DIAGNOSIS — I10 Essential (primary) hypertension: Secondary | ICD-10-CM

## 2021-04-22 NOTE — Progress Notes (Signed)
Cardiology Office Note  Date:  04/22/2021   ID:  Denise Glass, DOB May 03, 1956, MRN [id number]  PCP:  Jearld Fenton, NP   Chief Complaint  Patient presents with   Shortness of Breath    Patient c/o decreased energy and shortness of breath with any amount of activity. Medications reviewed by the patient verbally.     HPI:  Denise Glass is a 65 yo woman with PMH Depression/anxiety SOB Thyroid disease Obesity TKR  GERD Morbid obesity Who presents for follow-up of her tachycardia and palpitations, fatigue, new shortness of breath past 2 months  LOV  with myself 04/2018 Weight at that time 249 pounds Seen by one of our providers January 2021, weight 276 Weight today 276 pounds  On today's visit she reports having 2 months of SOB She feels this is new Unable to walk very far at all without having to stop and catch her breath Weight higher over the past 3 years 30 pounds Retired Company secretary depression", 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   Isolated SVEs were rare (<1.0%), SVE Couplets were rare (<1.0%), and no SVE Triplets were present. Isolated VEs were rare (<1.0%), and no VE Couplets or VE Triplets were present.   Patient triggered event was not associated with significant arrhythmia  Other past medical history reviewed  Previous echocardiogram 2016 showing a widely function grossly normal study Results reviewed with her in detail   PMH:   has a past medical history of Complication of anesthesia, Depression, DJD (degenerative joint disease), GERD (gastroesophageal reflux disease), Headache (2016),  Hypertension, Hypothyroidism, Insomnia, Mitral valve prolapse (2003), PONV (postoperative nausea and vomiting) (2002), and Urine frequency.  PSH:    Past Surgical History:  Procedure Laterality Date   BREAST BIOPSY  2001   Calcification - bx done by Dr. Raylene Everts   BREAST SURGERY  2001   biopsy   COLONOSCOPY WITH PROPOFOL N/A 11/13/2016   Procedure: COLONOSCOPY WITH PROPOFOL;  Surgeon: Manya Silvas, MD;  Location: [facility] END[facility]COPY;  Service: Endoscopy;  Laterality: N/A;   JOINT REPLACEMENT  2008   left and right knee replaced   KNEE ARTHROPLASTY  2007   Right Knee   KNEE ARTHROPLASTY  2008   Left Knee   left total knee replacement     right total knee     TOTAL HIP ARTHROPLASTY Right 08/22/2017   Procedure: TOTAL HIP ARTHROPLASTY;  Surgeon: Dereck Leep, MD;  Location: [facility] ORS;  Service: Orthopedics;  Laterality: Right;   TUBAL LIGATION      Current Outpatient Medications  Medication Sig Dispense Refill   cyclobenzaprine (FLEXERIL) 10 MG tablet Take 10 mg by mouth every 8 (eight) hours as needed.     diphenhydrAMINE HCl (BENADRYL ALLERGY PO) Take by mouth.     escitalopram (LEXAPRO) 10 MG tablet Take 1 tablet (10 mg total) by mouth daily. 90 tablet 1   HYDROcodone-acetaminophen (NORCO/VICODIN) 5-325 MG tablet Take 1 tablet by mouth 2 (two) times daily.     meclizine (ANTIVERT) 25 MG tablet meclizine 25 mg tablet  TAKE 1 TABLET  BY MOUTH EVERY 8 HOURS AS NEEDED FOR NAUSEA AND VOMITING     omeprazole (PRILOSEC OTC) 20 MG tablet Take 20 mg by mouth at bedtime.     oxybutynin (DITROPAN XL) 15 MG 24 hr tablet TAKE 1 TABLET BY MOUTH EVERYDAY AT BEDTIME 90 tablet 0   simvastatin (ZOCOR) 10 MG tablet Take 1 tablet (10 mg total) by mouth at bedtime. 30 tablet 2   zolpidem (AMBIEN) 10 MG tablet Take 1 tablet (10 mg total) by mouth at bedtime as needed. for sleep 30 tablet 2   No current facility-administered medications for this visit.     Allergies:   Prednazoline, Prednisone, and  Sulfa antibiotics   Social History:  The patient  reports that she has never smoked. She has never used smokeless tobacco. She reports that she does not drink alcohol and does not use drugs.   Family History:   family history includes COPD in her father and mother; Cancer in her father; Drug abuse in her brother; Healthy in her brother and son; Heart disease in her father and mother; Stroke in her mother.    Review of Systems: Review of Systems  Constitutional: Negative.   HENT: Negative.    Respiratory:  Positive for shortness of breath.   Cardiovascular: Negative.   Gastrointestinal: Negative.   Musculoskeletal: Negative.   Neurological: Negative.   Psychiatric/Behavioral: Negative.    All other systems reviewed and are negative.   PHYSICAL EXAM: VS:  BP 139/80 (BP Location: Left Wrist, Patient Position: Sitting, Cuff Size: Normal)   Pulse 76   Ht 5\' 2"  (1.575 m)   Wt 276 lb 4 oz (125.3 kg)   SpO2 97%   BMI 50.53 kg/m  , BMI Body mass index is 50.53 kg/m. Constitutional:  oriented to person, place, and time. No distress.  Obese HENT:  Head: Grossly normal Eyes:  no discharge. No scleral icterus.  Neck: No JVD, no carotid bruits  Cardiovascular: Regular rate and rhythm, no murmurs appreciated Pulmonary/Chest: Clear to auscultation bilaterally, no wheezes or rails Abdominal: Soft.  no distension.  no tenderness.  Musculoskeletal: Normal range of motion Neurological:  normal muscle tone. Coordination normal. No atrophy Skin: Skin warm and dry Psychiatric: normal affect, pleasant  Recent Labs: 04/19/2021: ALT 24; BUN 10; Creat 0.76; Potassium 4.1; Sodium 138; TSH 3.79    Lipid Panel Lab Results  Component Value Date   CHOL 224 (H) 04/19/2021   HDL 66 04/19/2021   LDLCALC 130 (H) 04/19/2021   TRIG 160 (H) 04/19/2021      Wt Readings from Last 3 Encounters:  04/22/21 276 lb 4 oz (125.3 kg)  04/19/21 276 lb 9.6 oz (125.5 kg)  10/17/20 270 lb (122.5 kg)       ASSESSMENT AND PLAN:  Atrial tachycardia ([place]) - Recent monitor with no significant arrhythmia noted, currently not on metoprolol  SOB (shortness of breath) on exertion - Reports having worsening symptoms, this was noted before Likely exacerbated by morbid obesity and deconditioning Baseline echocardiogram has been ordered  Anxiety/depression Loss of her mother, loss of husband several years ago, recent loss of boyfriend  Hyperlipidemia Previously declined cholesterol medication    Total encounter time more than 25 minutes  Greater than 50% was spent in counseling and coordination of care with the patient    No orders of the defined types were placed in this encounter.    Signed, Esmond Plants, M.D., Ph.D. 04/22/2021  [place], [place]

## 2021-04-22 NOTE — Patient Instructions (Addendum)
Medication Instructions:  No changes  If you need a refill on your cardiac medications before your next appointment, please call your pharmacy.   Lab work: No new labs needed  Testing/Procedures: Echocardiogram for shortness of breath   Follow-Up: At [facility], you and your health needs are our priority.  As part of our continuing mission to provide you with exceptional heart care, we have created designated Provider Care Teams.  These Care Teams include your primary Cardiologist (physician) and Advanced Practice Providers (APPs -  Physician Assistants and Nurse Practitioners) who all work together to provide you with the care you need, when you need it.  You will need a follow up appointment as needed: we will call with results  Providers on your designated Care Team:   Murray Hodgkins, NP Christell Faith, PA-C Marrianne Mood, PA-C Cadence Delevan, [place]  COVID-19 Vaccine Information can be found at: ShippingScam.co.uk For questions related to vaccine distribution or appointments, please email vaccine@[email or web address] .com or call [phone].   Your physician has requested that you have an echocardiogram. Echocardiography is a painless test that uses sound waves to create images of your heart. It provides your doctor with information about the size and shape of your heart and how well your heart's chambers and valves are working. This procedure takes approximately one hour. There are no restrictions for this procedure.  There is a possibility that an IV may need to be started during your test to inject an image enhancing agent. This is done to obtain more optimal pictures of your heart. Therefore we ask that you do at least drink some water prior to coming in to hydrate your veins.

## 2021-05-04 DIAGNOSIS — M792 Neuralgia and neuritis, unspecified: Secondary | ICD-10-CM | POA: Diagnosis not present

## 2021-05-04 DIAGNOSIS — Z79891 Long term (current) use of opiate analgesic: Secondary | ICD-10-CM | POA: Diagnosis not present

## 2021-05-04 DIAGNOSIS — G894 Chronic pain syndrome: Secondary | ICD-10-CM | POA: Diagnosis not present

## 2021-05-04 DIAGNOSIS — Z79899 Other long term (current) drug therapy: Secondary | ICD-10-CM | POA: Diagnosis not present

## 2021-05-06 ENCOUNTER — Ambulatory Visit: Payer: Self-pay

## 2021-05-06 NOTE — Telephone Encounter (Signed)
Pt. Has a sore throat, started today. Son is bringing her a COVID 19 test when he gets off work today.No available virtual visits today. No other symptoms, feels like she has COVID 19. Requesting PCP send in an anti-viral "in case my test is positive." Does not have My Chart access for e-visit. Please advise pt.

## 2021-05-06 NOTE — Telephone Encounter (Signed)
      Message from [place] sent at 05/06/2021  1:31 PM EDT  Summary: Covid advice   Pt stated she thinks she has COVID, pt is having symptoms such as sore throat, pt wanted to know what she should do next / discuss her options.             Call History   Type Contact Phone/Fax User  05/06/2021 01:30 PM EDT Phone (Incoming) [address] Valisa Crumrine (Self) [phone] (H) Pawlus, Monica A    Reason for Disposition  [1] Sore throat is the only symptom AND [2] present > 48 hours  Answer Assessment - Initial Assessment Questions 1. ONSET: "When did the throat start hurting?" (Hours or days ago)      Today 2. SEVERITY: "How bad is the sore throat?" (Scale 1-10; mild, moderate or severe)   - MILD (1-3):  doesn't interfere with eating or normal activities   - MODERATE (4-7): interferes with eating some solids and normal activities   - SEVERE (8-10):  excruciating pain, interferes with most normal activities   - SEVERE DYSPHAGIA: can't swallow liquids, drooling     Mild 3. STREP EXPOSURE: "Has there been any exposure to strep within the past week?" If Yes, ask: "What type of contact occurred?"      No 4.  VIRAL SYMPTOMS: "Are there any symptoms of a cold, such as a runny nose, cough, hoarse voice or red eyes?"      No 5. FEVER: "Do you have a fever?" If Yes, ask: "What is your temperature, how was it measured, and when did it start?"     No 6. PUS ON THE TONSILS: "Is there pus on the tonsils in the back of your throat?"     No 7. OTHER SYMPTOMS: "Do you have any other symptoms?" (e.g., difficulty breathing, headache, rash)     No 8. PREGNANCY: "Is there any chance you are pregnant?" "When was your last menstrual period?"     No  Protocols used: Sore Throat-A-AH

## 2021-05-07 NOTE — Telephone Encounter (Signed)
Not appropriate. Recommend UC eval over the weekend or she can schedule an appt for Monday morning.

## 2021-05-11 ENCOUNTER — Telehealth: Payer: Self-pay

## 2021-05-11 NOTE — Telephone Encounter (Signed)
Attempted to contact the patient, no answer.

## 2021-05-11 NOTE — Telephone Encounter (Signed)
Copied from [place] [id number]. Topic: General - Other >> May 11, 2021 11:31 AM Leward Quan A wrote: Reason for CRM: Patient called in to say that she had a missed call from the office but nothing noted. Please advise

## 2021-05-19 ENCOUNTER — Other Ambulatory Visit: Payer: Self-pay

## 2021-05-19 ENCOUNTER — Ambulatory Visit (INDEPENDENT_AMBULATORY_CARE_PROVIDER_SITE_OTHER): Payer: Medicare Other

## 2021-05-19 DIAGNOSIS — R0602 Shortness of breath: Secondary | ICD-10-CM

## 2021-05-19 DIAGNOSIS — I10 Essential (primary) hypertension: Secondary | ICD-10-CM

## 2021-05-19 LAB — ECHOCARDIOGRAM COMPLETE
AR max vel: 2.07 cm2
AV Area VTI: 2.36 cm2
AV Area mean vel: 2.19 cm2
AV Mean grad: 2 mmHg
AV Peak grad: 4.8 mmHg
Ao pk vel: 1.1 m/s
Area-P 1/2: 3.26 cm2
MV VTI: 2.23 cm2
S' Lateral: 3.1 cm

## 2021-05-19 MED ORDER — PERFLUTREN LIPID MICROSPHERE
1.0000 mL | INTRAVENOUS | Status: AC | PRN
  Administered 2021-05-19: 4 mL via INTRAVENOUS

## 2021-05-20 ENCOUNTER — Telehealth: Payer: Self-pay | Admitting: Cardiovascular Disease

## 2021-05-20 NOTE — Telephone Encounter (Signed)
Was able to reach out to Denise Glass regarding her ECHO results, ECHO was completed yesterday.  Advised radiology has read the reports, Preliminary results reviewed by nurse. However, Dr. Rockey Situ has not seen the report at this time, should expect a call back Monday or Tuesday of next week for report.   My does not have MyChart.   Denise Glass verbalized understanding and thankful for the return call to explain.

## 2021-05-20 NOTE — Telephone Encounter (Signed)
Patient would like echocardiogram results. Please call.

## 2021-05-24 NOTE — Telephone Encounter (Signed)
Able to reach pt regarding her recent ECHO, Dr. Rockey Situ had a chance to review her results and advised   "Echo  Normal EF , E >55%  No significant valve disease  Normal pressures"  Apologize to pt regarding long delay for results to be called, the clinic and Dr. Rockey Situ have been very visit with the in flood of calls, messages, clinic-office visit, and in-patient hospital rounds. Denise Glass verbalized understanding and thankful for returning her call.  Reports still having sob on execration, was hoping ECHO was going to give answer to her sob. Pt asked about appt with pulmonary, advised could call their office in the am to schedule an appt as she ok with self-referral.  Otherwise all questions or concerns were address and no additional concerns at this time. Agreeable to plan, will call back for anything further.

## 2021-05-24 NOTE — Telephone Encounter (Signed)
Patient calling to check on status of results °

## 2021-06-06 IMAGING — MR MR CERVICAL SPINE W/O CM
5 series · 38 of 48 positions shown · non-contrast
Comparison: None.

CLINICAL DATA: Pain

EXAM:
MRI CERVICAL SPINE WITHOUT CONTRAST
TECHNIQUE: Multiplanar, multisequence MR imaging of the cervical spine was
performed. No intravenous contrast was administered.

[Series 9: T2 · sagittal · 3.0mm · 0.62mm/px · 8 of 15 slices shown (1 of 2)]
[im 1/15]
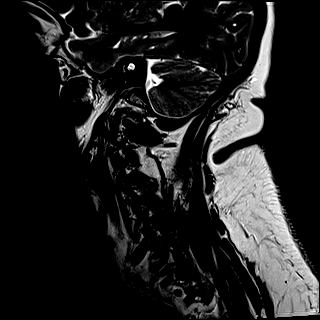
[im 3/15]
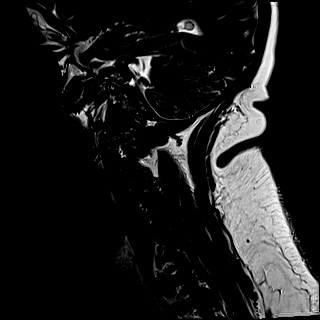
[im 5/15]
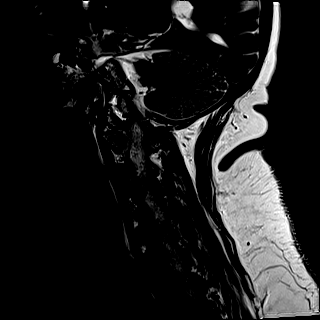
[im 7/15]
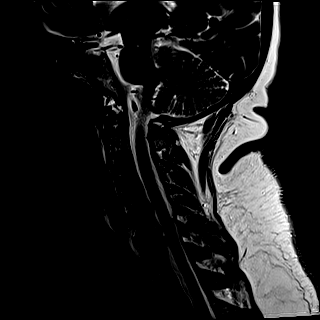
[im 9/15]
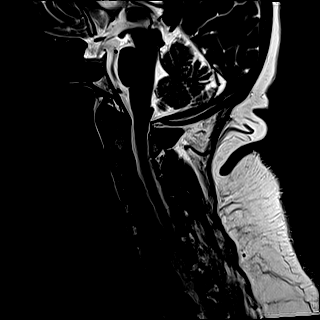
[im 11/15]
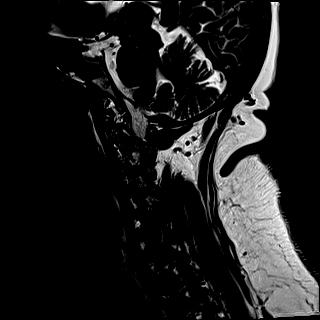
[im 13/15]
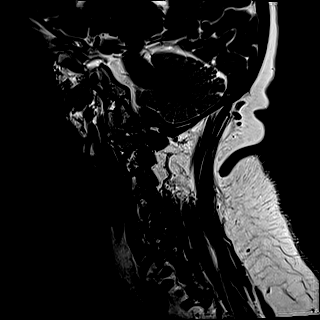
[im 15/15]
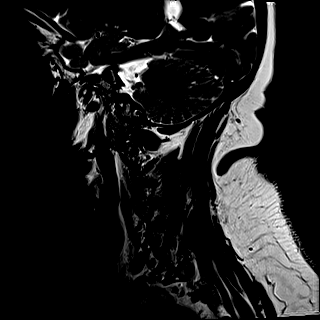

[Series 10: FLAIR · sagittal · 3.0mm · 0.78mm/px · 7 of 15 slices shown]
[im 1/15]
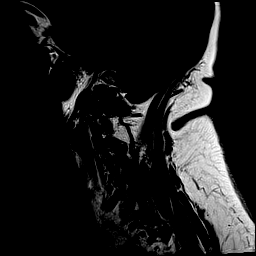
[im 3/15]
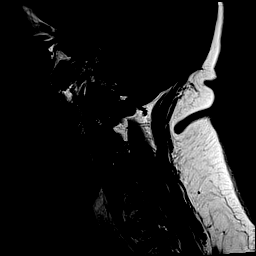
[im 5/15]
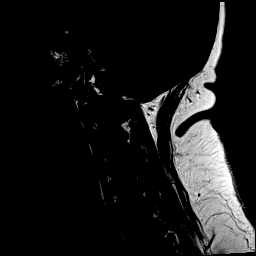
[im 8/15]
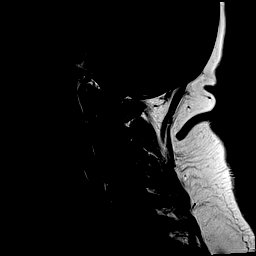
[im 10/15]
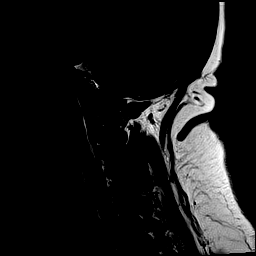
[im 12/15]
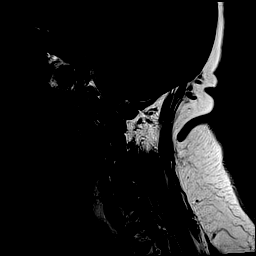
[im 15/15]
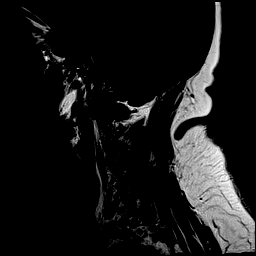

[Series 11: STIR · sagittal · 3.0mm · 0.62mm/px · 7 of 15 slices shown]
[im 1/15]
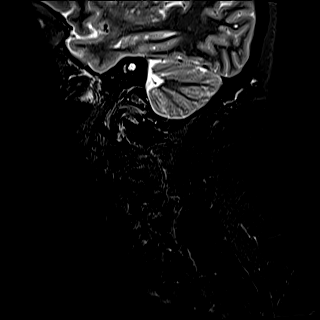
[im 3/15]
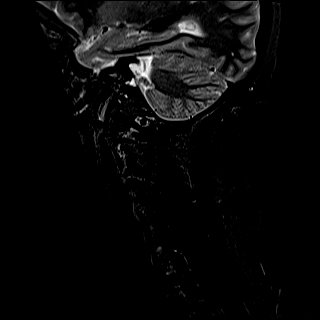
[im 5/15]
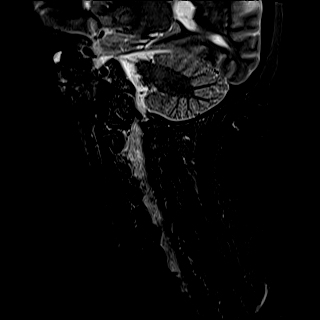
[im 8/15]
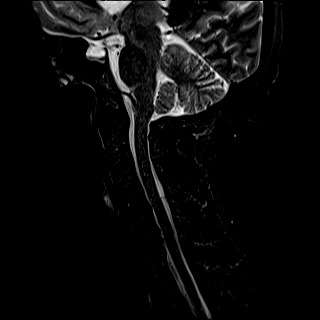
[im 10/15]
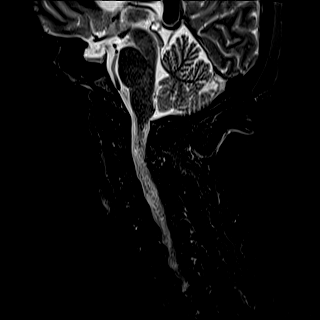
[im 12/15]
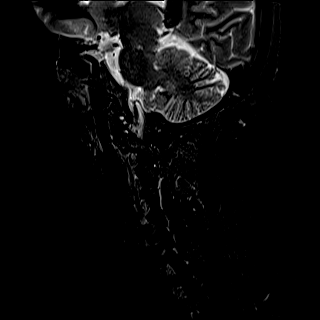
[im 15/15]
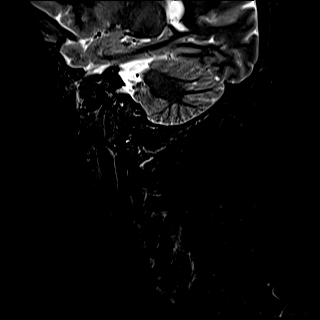

[Series 12: T2 · axial · 3.0mm · 0.70mm/px · z∈[-265,-179]mm · 9 of 27 slices shown (2 of 2)]
[im 1/27]
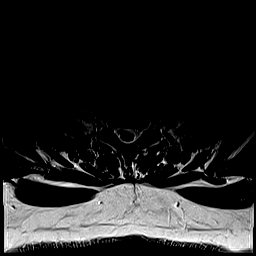
[im 5/27]
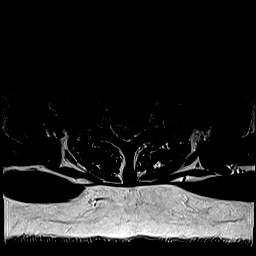
[im 9/27]
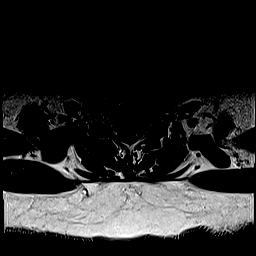
[im 11/27]
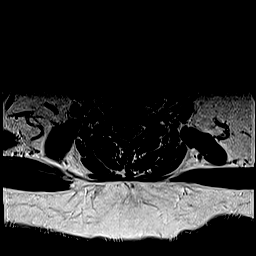
[im 14/27]
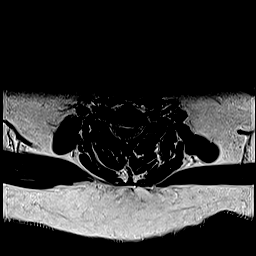
[im 16/27]
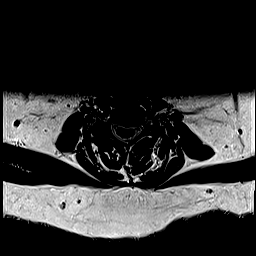
[im 18/27]
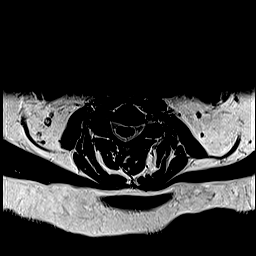
[im 22/27]
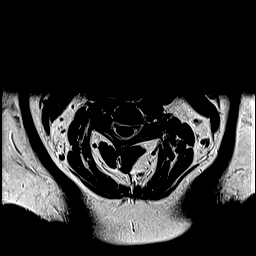
[im 27/27]
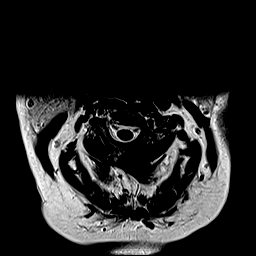

[Series 14: ax mpgr · axial · 3.0mm · 0.35mm/px · z∈[-265,-195]mm · 7 of 27 slices shown]
[im 1/27]
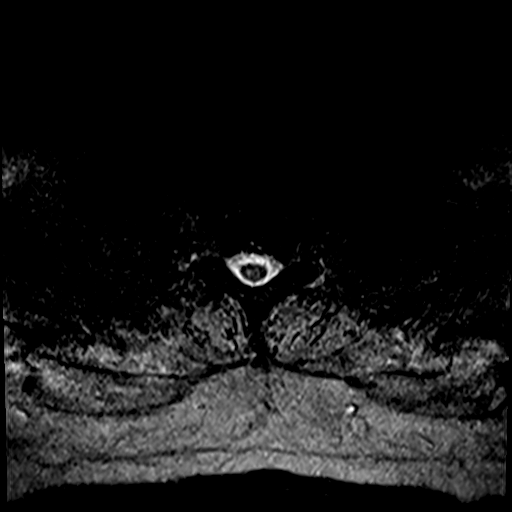
[im 5/27]
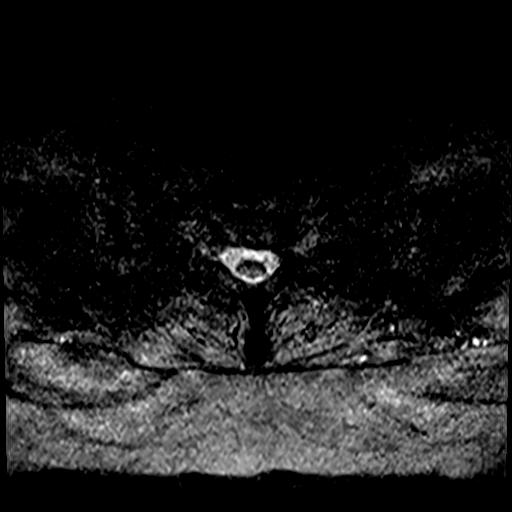
[im 9/27]
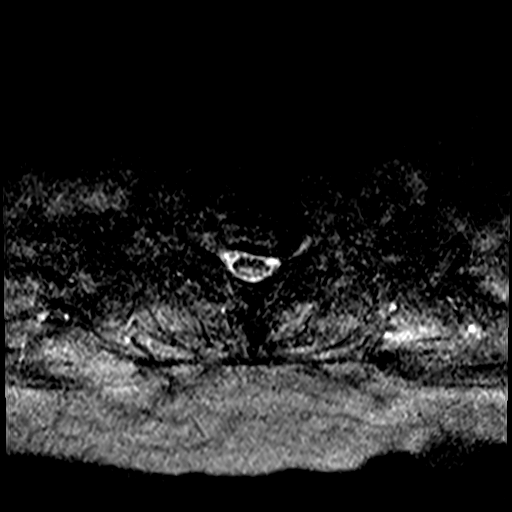
[im 11/27]
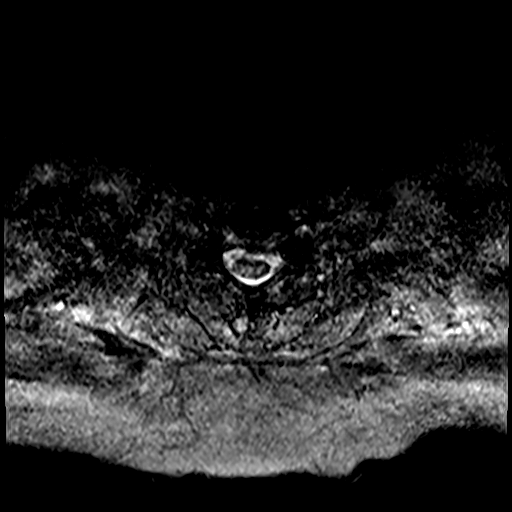
[im 16/27]
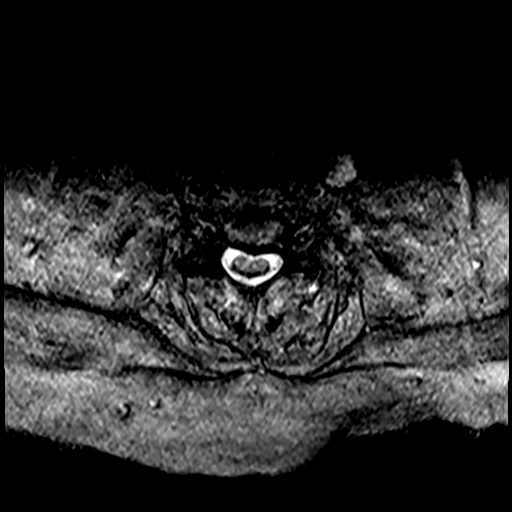
[im 18/27]
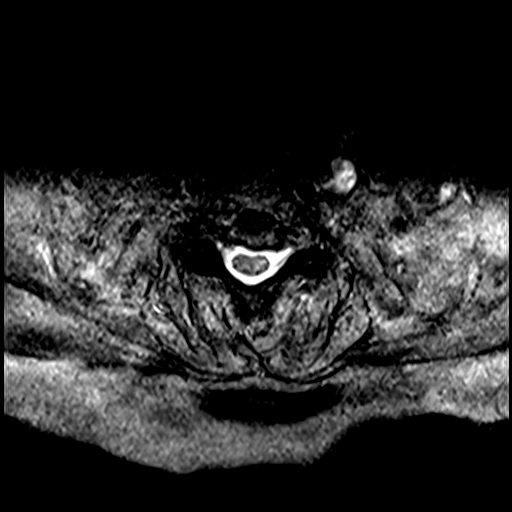
[im 22/27]
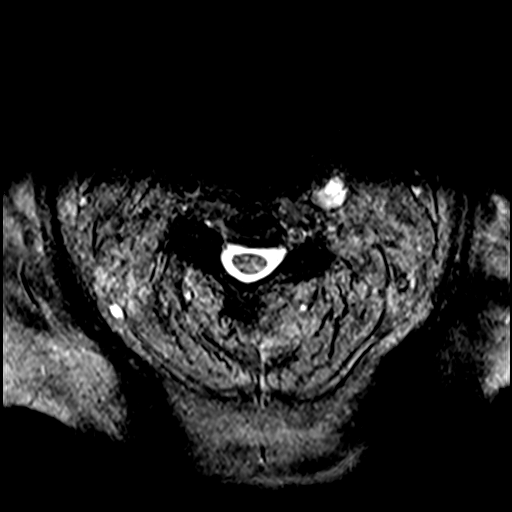

[38 of 48 positions shown; findings below may reference images not displayed]

FINDINGS: Alignment: Straightening of cervical lordosis. Minimal grade 1 C5-6
retrolisthesis.

Vertebrae: Normal bone marrow signal intensity. No focal osseous
lesion.

Cord: Normal signal and morphology.

Posterior Fossa, vertebral arteries: Negative.

Disc levels: Multilevel desiccation. Mild C5-6 and C6-7 disc space
loss.

C2-3: No significant spinal canal or neural foraminal narrowing.

C3-4: No significant spinal canal or neural foraminal narrowing.

C4-5: Small central protrusion with bilateral uncovertebral
degenerative spurring. No significant spinal canal or neural
foraminal narrowing.

C5-6: Disc osteophyte complex with bilateral uncovertebral and facet
hypertrophy. Mild spinal canal, mild right and moderate left neural
foraminal narrowing.

C6-7: Disc osteophyte complex with superimposed left
paracentral/subarticular protrusion and left predominant
uncovertebral/facet hypertrophy. Mild spinal canal, mild right and
moderate left neural foraminal narrowing.

C7-T1: Left uncovertebral and facet degenerative spurring. Mild left
neural foraminal narrowing. No significant spinal canal or right
neural foraminal narrowing.

Paraspinal tissues: Within normal limits.
IMPRESSION: Left C6-7 paracentral/subarticular protrusion with moderate left
neural foraminal narrowing.

Moderate left C5-6 neural foraminal narrowing.

Mild C5-6 and C6-7 spinal canal narrowing.

Mild right C5-7 and left C7-T1 neural foraminal narrowing.

## 2021-06-14 ENCOUNTER — Ambulatory Visit: Payer: Self-pay | Admitting: *Deleted

## 2021-06-14 NOTE — Telephone Encounter (Signed)
Pt called stating that she is having pain in R foot. She states that she is experiencing foot pain, swelling, and not able to walk or apply pressure. Please advise.   Called patient to review symptoms. C/o pain , swelling, redness, hot to touch "fever" in right foot since last night. Reports located "in step" and top of right foot. Not able to walk or apply pressure to right foot. Encouraged to go to ED for evaluation. Patient declined due to wait times. Appt scheduled for 06/15/21. Care advise given. Patient verbalized understanding of call back or go to [facility] or ED if symptoms worsen.  Reason for Disposition  [1] Swollen foot AND [2] fever  Answer Assessment - Initial Assessment Questions 1. ONSET: "When did the pain start?"      Last night  2. LOCATION: "Where is the pain located?"      Right "in step" and top of foot. 3. PAIN: "How bad is the pain?"    (Scale 1-10; or mild, moderate, severe)  - MILD (1-3): doesn't interfere with normal activities.   - MODERATE (4-7): interferes with normal activities (e.g., work or school) or awakens from sleep, limping.   - SEVERE (8-10): excruciating pain, unable to do any normal activities, unable to walk.      Unable to walk , severe 4. WORK OR EXERCISE: "Has there been any recent work or exercise that involved this part of the body?"      No  5. CAUSE: "What do you think is causing the foot pain?"     Not sure  6. OTHER SYMPTOMS: "Do you have any other symptoms?" (e.g., leg pain, rash, fever, numbness)     Fever in foot, swelling, redness and hot to touch  7. PREGNANCY: "Is there any chance you are pregnant?" "When was your last menstrual period?"     na  Protocols used: Foot Pain-A-AH

## 2021-06-15 ENCOUNTER — Ambulatory Visit: Payer: Medicare Other | Admitting: Internal Medicine

## 2021-06-15 NOTE — Progress Notes (Deleted)
Subjective:    Patient ID: Denise Glass, female    DOB: Nov 11, 1955, 65 y.o.   MRN: [id number]  HPI  Patient presents the clinic today with complaint of swelling, redness and warmth of her foot.  She noticed this.  Review of Systems  Past Medical History:  Diagnosis Date   Complication of anesthesia    pt reports her appetite takes 2-3 weeks to come back after anesthesia.   Depression    DJD (degenerative joint disease)    GERD (gastroesophageal reflux disease)    Headache 2016   cluster head aches, Head aches have subsided.   Hypertension    Hypothyroidism    Insomnia    Mitral valve prolapse 2003   Followed by Dr. Clayborn Bigness (prn)   PONV (postoperative nausea and vomiting) 2002   With BTL   Urine frequency     Current Outpatient Medications  Medication Sig Dispense Refill   cyclobenzaprine (FLEXERIL) 10 MG tablet Take 10 mg by mouth every 8 (eight) hours as needed.     diphenhydrAMINE HCl (BENADRYL ALLERGY PO) Take by mouth.     escitalopram (LEXAPRO) 10 MG tablet Take 1 tablet (10 mg total) by mouth daily. 90 tablet 1   HYDROcodone-acetaminophen (NORCO/VICODIN) 5-325 MG tablet Take 1 tablet by mouth 2 (two) times daily.     meclizine (ANTIVERT) 25 MG tablet meclizine 25 mg tablet  TAKE 1 TABLET BY MOUTH EVERY 8 HOURS AS NEEDED FOR NAUSEA AND VOMITING     omeprazole (PRILOSEC OTC) 20 MG tablet Take 20 mg by mouth at bedtime.     oxybutynin (DITROPAN XL) 15 MG 24 hr tablet TAKE 1 TABLET BY MOUTH EVERYDAY AT BEDTIME 90 tablet 0   simvastatin (ZOCOR) 10 MG tablet Take 1 tablet (10 mg total) by mouth at bedtime. 30 tablet 2   zolpidem (AMBIEN) 10 MG tablet Take 1 tablet (10 mg total) by mouth at bedtime as needed. for sleep 30 tablet 2   No current facility-administered medications for this visit.    Allergies  Allergen Reactions   Prednazoline Other (See Comments)   Prednisone    Sulfa Antibiotics Other (See Comments)    Unknown    Family History  Problem  Relation Age of Onset   COPD Mother    Heart disease Mother    Stroke Mother    Heart disease Father    COPD Father    Cancer Father        throat   Healthy Brother    Drug abuse Brother    Healthy Son     Social History   Socioeconomic History   Marital status: Widowed    Spouse name: Not on file   Number of children: Not on file   Years of education: Not on file   Highest education level: Not on file  Occupational History   Occupation: retired  Tobacco Use   Smoking status: Never   Smokeless tobacco: Never  Vaping Use   Vaping Use: Never used  Substance and Sexual Activity   Alcohol use: No   Drug use: No   Sexual activity: Not Currently  Other Topics Concern   Not on file  Social History Narrative   Patient is a 65 y/o widow (2011) who lives independently at home. 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 She has one son, a daughter-in-law and a grandson that live locally.  She struggles with her relationships with mother and brother.  Social Determinants of Health   Financial Resource Strain: Not on file  Food Insecurity: Not on file  Transportation Needs: Not on file  Physical Activity: Not on file  Stress: Not on file  Social Connections: Not on file  Intimate Partner Violence: Not on file     Constitutional: Denies fever, malaise, fatigue, headache or abrupt weight changes.  HEENT: Denies eye pain, eye redness, ear pain, ringing in the ears, wax buildup, runny nose, nasal congestion, bloody nose, or sore throat. Respiratory: Denies difficulty breathing, shortness of breath, cough or sputum production.   Cardiovascular: Denies chest pain, chest tightness, palpitations or swelling in the hands or feet.  Gastrointestinal: Denies abdominal pain, bloating, constipation, diarrhea or blood in the stool.  GU: Denies urgency, frequency, pain with urination, burning sensation, blood in urine, odor or discharge. Musculoskeletal:  14 in March denies decrease in range of motion, difficulty with gait, muscle pain or joint pain and swelling.  Skin: Denies redness, rashes, lesions or ulcercations.  Neurological: Denies dizziness, difficulty with memory, difficulty with speech or problems with balance and coordination.  Psych: Denies anxiety, depression, SI/HI.  No other specific complaints in a complete review of systems (except as listed in HPI above).     Objective:   Physical Exam        Assessment & Plan:

## 2021-06-16 ENCOUNTER — Ambulatory Visit (INDEPENDENT_AMBULATORY_CARE_PROVIDER_SITE_OTHER): Payer: Medicare Other | Admitting: Podiatry

## 2021-06-16 ENCOUNTER — Ambulatory Visit (INDEPENDENT_AMBULATORY_CARE_PROVIDER_SITE_OTHER): Payer: Medicare Other

## 2021-06-16 ENCOUNTER — Other Ambulatory Visit: Payer: Self-pay

## 2021-06-16 DIAGNOSIS — M79671 Pain in right foot: Secondary | ICD-10-CM

## 2021-06-16 DIAGNOSIS — M722 Plantar fascial fibromatosis: Secondary | ICD-10-CM

## 2021-06-22 NOTE — Progress Notes (Signed)
Subjective:  Patient ID: Denise Glass, female    DOB: [birth year],  MRN: [id number]  Chief Complaint  Patient presents with   Foot Pain    Right foot pain on the side of the heel     65 y.o. female presents with the above complaint.  Patient presents with pain to the right plantar midfoot/medial band of the plantar fascia.  Patient states painful to touch painful to walk on has progressed to gotten worse nothing has helped.  She has not seen anyone as prior to seeing me.  She denies conservative treatment options.  She states that it hurts with ambulation pain scale 7 out of 10 sharp shooting in nature.   Review of Systems: Negative except as noted in the HPI. Denies N/V/F/Ch.  Past Medical History:  Diagnosis Date   Complication of anesthesia    pt reports her appetite takes 2-3 weeks to come back after anesthesia.   Depression    DJD (degenerative joint disease)    GERD (gastroesophageal reflux disease)    Headache 2016   cluster head aches, Head aches have subsided.   Hypertension    Hypothyroidism    Insomnia    Mitral valve prolapse 2003   Followed by Dr. Clayborn Bigness (prn)   PONV (postoperative nausea and vomiting) 2002   With BTL   Urine frequency     Current Outpatient Medications:    cyclobenzaprine (FLEXERIL) 10 MG tablet, Take 10 mg by mouth every 8 (eight) hours as needed., Disp: , Rfl:    diphenhydrAMINE HCl (BENADRYL ALLERGY PO), Take by mouth., Disp: , Rfl:    escitalopram (LEXAPRO) 10 MG tablet, Take 1 tablet (10 mg total) by mouth daily., Disp: 90 tablet, Rfl: 1   HYDROcodone-acetaminophen (NORCO/VICODIN) 5-325 MG tablet, Take 1 tablet by mouth 2 (two) times daily., Disp: , Rfl:    meclizine (ANTIVERT) 25 MG tablet, meclizine 25 mg tablet  TAKE 1 TABLET BY MOUTH EVERY 8 HOURS AS NEEDED FOR NAUSEA AND VOMITING, Disp: , Rfl:    omeprazole (PRILOSEC OTC) 20 MG tablet, Take 20 mg by mouth at bedtime., Disp: , Rfl:    oxybutynin (DITROPAN XL) 15 MG 24 hr tablet, TAKE  1 TABLET BY MOUTH EVERYDAY AT BEDTIME, Disp: 90 tablet, Rfl: 0   simvastatin (ZOCOR) 10 MG tablet, Take 1 tablet (10 mg total) by mouth at bedtime., Disp: 30 tablet, Rfl: 2   zolpidem (AMBIEN) 10 MG tablet, Take 1 tablet (10 mg total) by mouth at bedtime as needed. for sleep, Disp: 30 tablet, Rfl: 2  Social History   Tobacco Use  Smoking Status Never  Smokeless Tobacco Never    Allergies  Allergen Reactions   Prednazoline Other (See Comments)   Prednisone    Sulfa Antibiotics Other (See Comments)    Unknown   Objective:  There were no vitals filed for this visit. There is no height or weight on file to calculate BMI. Constitutional Well developed. Well nourished.  Vascular Dorsalis pedis pulses palpable bilaterally. Posterior tibial pulses palpable bilaterally. Capillary refill normal to all digits.  No cyanosis or clubbing noted. Pedal hair growth normal.  Neurologic Normal speech. Oriented to person, place, and time. Epicritic sensation to light touch grossly present bilaterally.  Dermatologic Nails well groomed and normal in appearance. No open wounds. No skin lesions.  Orthopedic: Normal joint ROM without pain or crepitus bilaterally. No visible deformities. Tender to palpation at the plantar midfoot at the medial band of the plantar fascia No pain  with calcaneal squeeze right. Ankle ROM diminished range of motion right. Silfverskiold Test: positive right.  Pes planus valgus foot structure noted   Radiographs: Taken and reviewed. No acute fractures or dislocations. No evidence of stress fracture.  Plantar heel spur present. Posterior heel spur absent.   Assessment:   1. Plantar fasciitis, right    Plan:  Patient was evaluated and treated and all questions answered.  Plantar Fasciitis, right - XR reviewed as above.  - Educated on icing and stretching. Instructions given.  - Injection delivered to the plantar fascia as below. - DME: None - Pharmacologic  management: None  Procedure: Injection Tendon/Ligament Location: Right plantar fascia at the glabrous junction; medial approach. Skin Prep: alcohol Injectate: 0.5 cc 0.5% marcaine plain, 0.5 cc of 1% Lidocaine, 0.5 cc kenalog 10. Disposition: Patient tolerated procedure well. Injection site dressed with a band-aid.  No follow-ups on file.

## 2021-06-28 ENCOUNTER — Telehealth: Payer: Self-pay

## 2021-06-28 ENCOUNTER — Institutional Professional Consult (permissible substitution): Payer: Medicare Other | Admitting: Internal Medicine

## 2021-06-28 NOTE — Telephone Encounter (Signed)
ATC patient in regards of appointment, would like know if she was still coming to her appointment at 3:00pm.

## 2021-06-28 NOTE — Progress Notes (Deleted)
   Denise Glass, female    DOB: 1956/04/09, 65 y.o.   MRN: [id number]   Brief patient profile:  13 yowf *** referred to pulmonary clinic in [facility]  06/28/2021 by Dr     Marland Kitchen  for ***      History of Present Illness  06/28/2021  Pulmonary/ 1st office eval/ Denise Glass / [employer]  No chief complaint on file.    Dyspnea:  *** Cough: *** Sleep: *** SABA use:   Past Medical History:  Diagnosis Date   Complication of anesthesia    pt reports her appetite takes 2-3 weeks to come back after anesthesia.   Depression    DJD (degenerative joint disease)    GERD (gastroesophageal reflux disease)    Headache 2016   cluster head aches, Head aches have subsided.   Hypertension    Hypothyroidism    Insomnia    Mitral valve prolapse 2003   Followed by Dr. Clayborn Bigness (prn)   PONV (postoperative nausea and vomiting) 2002   With BTL   Urine frequency     Outpatient Medications Prior to Visit  Medication Sig Dispense Refill   cyclobenzaprine (FLEXERIL) 10 MG tablet Take 10 mg by mouth every 8 (eight) hours as needed.     diphenhydrAMINE HCl (BENADRYL ALLERGY PO) Take by mouth.     escitalopram (LEXAPRO) 10 MG tablet Take 1 tablet (10 mg total) by mouth daily. 90 tablet 1   HYDROcodone-acetaminophen (NORCO/VICODIN) 5-325 MG tablet Take 1 tablet by mouth 2 (two) times daily.     meclizine (ANTIVERT) 25 MG tablet meclizine 25 mg tablet  TAKE 1 TABLET BY MOUTH EVERY 8 HOURS AS NEEDED FOR NAUSEA AND VOMITING     omeprazole (PRILOSEC OTC) 20 MG tablet Take 20 mg by mouth at bedtime.     oxybutynin (DITROPAN XL) 15 MG 24 hr tablet TAKE 1 TABLET BY MOUTH EVERYDAY AT BEDTIME 90 tablet 0   simvastatin (ZOCOR) 10 MG tablet Take 1 tablet (10 mg total) by mouth at bedtime. 30 tablet 2   zolpidem (AMBIEN) 10 MG tablet Take 1 tablet (10 mg total) by mouth at bedtime as needed. for sleep 30 tablet 2   No facility-administered medications prior to visit.     Objective:     There were no vitals  taken for this visit.         Assessment   No problem-specific Assessment & Plan notes found for this encounter.     Christinia Gully, MD 06/28/2021

## 2021-07-10 ENCOUNTER — Other Ambulatory Visit: Payer: Self-pay | Admitting: Family Medicine

## 2021-07-10 DIAGNOSIS — N3281 Overactive bladder: Secondary | ICD-10-CM

## 2021-07-10 DIAGNOSIS — N3942 Incontinence without sensory awareness: Secondary | ICD-10-CM

## 2021-07-10 DIAGNOSIS — R350 Frequency of micturition: Secondary | ICD-10-CM

## 2021-07-17 ENCOUNTER — Other Ambulatory Visit: Payer: Self-pay | Admitting: Internal Medicine

## 2021-07-19 ENCOUNTER — Ambulatory Visit: Payer: Medicare Other | Admitting: Podiatry

## 2021-08-03 DIAGNOSIS — Z79899 Other long term (current) drug therapy: Secondary | ICD-10-CM | POA: Diagnosis not present

## 2021-08-03 DIAGNOSIS — G894 Chronic pain syndrome: Secondary | ICD-10-CM | POA: Diagnosis not present

## 2021-08-03 DIAGNOSIS — M792 Neuralgia and neuritis, unspecified: Secondary | ICD-10-CM | POA: Diagnosis not present

## 2021-08-09 ENCOUNTER — Telehealth: Payer: Self-pay

## 2021-08-09 NOTE — Telephone Encounter (Signed)
Copied from [place] [phone]. Topic: General - Other >> Aug 09, 2021 10:27 AM Leward Quan A wrote: Reason for CRM: Patient called in to request the record from her last pelvic exam from [name] was still in office faxed to the [facility]. Say they need this due to issues she have going on now. Please advise  Fax# [phone]    Ph# [phone]    Pap smear faxed over to the requested fax number per patient request.

## 2021-08-16 ENCOUNTER — Ambulatory Visit (INDEPENDENT_AMBULATORY_CARE_PROVIDER_SITE_OTHER): Payer: Medicare Other | Admitting: Internal Medicine

## 2021-08-16 ENCOUNTER — Encounter: Payer: Self-pay | Admitting: Internal Medicine

## 2021-08-16 ENCOUNTER — Other Ambulatory Visit: Payer: Self-pay

## 2021-08-16 VITALS — BP 131/90 | HR 90 | Temp 97.5°F | Resp 18 | Ht 62.0 in | Wt 275.4 lb

## 2021-08-16 DIAGNOSIS — F5104 Psychophysiologic insomnia: Secondary | ICD-10-CM

## 2021-08-16 DIAGNOSIS — Z23 Encounter for immunization: Secondary | ICD-10-CM

## 2021-08-16 DIAGNOSIS — R102 Pelvic and perineal pain: Secondary | ICD-10-CM | POA: Diagnosis not present

## 2021-08-16 MED ORDER — ZOLPIDEM TARTRATE 10 MG PO TABS
10.0000 mg | ORAL_TABLET | Freq: Every evening | ORAL | 0 refills | Status: DC | PRN
Start: 2021-08-16 — End: 2022-07-13

## 2021-08-16 NOTE — Patient Instructions (Signed)
Pelvic Pain, Female Pelvic pain is pain in your lower belly (abdomen), below your belly button and between your hips. The pain may start suddenly (be acute), keep coming back (be recurring), or last a long time (become chronic). Pelvic pain that lasts longer than 6 months is called chronic pelvic pain. There are many causes of pelvic pain. Sometimes the cause of pelvic pain is not known. Follow these instructions at home:  Take over-the-counter and prescription medicines only as told by your doctor. Rest as told by your doctor. Do not have sex if it hurts. Keep a journal of your pelvic pain. Write down: When the pain started. Where the pain is located. What seems to make the pain better or worse, such as food or your period (menstrual cycle). Any symptoms you have along with the pain. Keep all follow-up visits as told by your doctor. This is important. Contact a doctor if: Medicine does not help your pain. Your pain comes back. You have new symptoms. You have unusual discharge or bleeding from your vagina. You have a fever or chills. You are having trouble pooping (constipation). You have blood in your pee (urine) or poop (stool). Your pee smells bad. You feel weak or light-headed. Get help right away if: You have sudden pain that is very bad. Your pain keeps getting worse. You have very bad pain and also have any of these symptoms: A fever. Feeling sick to your stomach (nausea). Throwing up (vomiting). Being very sweaty. You pass out (lose consciousness). Summary Pelvic pain is pain in your lower belly (abdomen), below your belly button and between your hips. There are many possible causes of pelvic pain. Keep a journal of your pelvic pain. This information is not intended to replace advice given to you by your health care provider. Make sure you discuss any questions you have with your health care provider. Document Revised: 03/20/2018 Document Reviewed: 03/20/2018 Elsevier  Patient Education  [place].

## 2021-08-16 NOTE — Progress Notes (Signed)
Subjective:    Patient ID: Denise Glass, female    DOB: Aug 23, 1956, 65 y.o.   MRN: [id number]  HPI  Pt presents to the clinic today with c/o pelvic pain. This started 6 months ago. It was intermittent during that time but has become more constant in the last 2 weeks. She describes the pain as menstrual cramping. She denies urinary urgency, frequency, dysuria, blood in her urine, vaginal discharge, irritation or odor. She has tried Tylenol and/or Hydrocodone as needed with good relief of symptoms. Her last pap was 05/2017. She has an appt with GYN 10/03/21.  She would also like a refill of her Ambien today.  Review of Systems     Past Medical History:  Diagnosis Date   Complication of anesthesia    pt reports her appetite takes 2-3 weeks to come back after anesthesia.   Depression    DJD (degenerative joint disease)    GERD (gastroesophageal reflux disease)    Headache 2016   cluster head aches, Head aches have subsided.   Hypertension    Hypothyroidism    Insomnia    Mitral valve prolapse 2003   Followed by Dr. Clayborn Bigness (prn)   PONV (postoperative nausea and vomiting) 2002   With BTL   Urine frequency     Current Outpatient Medications  Medication Sig Dispense Refill   cyclobenzaprine (FLEXERIL) 10 MG tablet Take 10 mg by mouth every 8 (eight) hours as needed.     diphenhydrAMINE HCl (BENADRYL ALLERGY PO) Take by mouth.     escitalopram (LEXAPRO) 10 MG tablet Take 1 tablet (10 mg total) by mouth daily. 90 tablet 1   HYDROcodone-acetaminophen (NORCO/VICODIN) 5-325 MG tablet Take 1 tablet by mouth 2 (two) times daily.     meclizine (ANTIVERT) 25 MG tablet meclizine 25 mg tablet  TAKE 1 TABLET BY MOUTH EVERY 8 HOURS AS NEEDED FOR NAUSEA AND VOMITING     omeprazole (PRILOSEC OTC) 20 MG tablet Take 20 mg by mouth at bedtime.     oxybutynin (DITROPAN XL) 15 MG 24 hr tablet TAKE 1 TABLET BY MOUTH EVERYDAY AT BEDTIME 90 tablet 0   simvastatin (ZOCOR) 10 MG tablet TAKE 1 TABLET BY  MOUTH EVERYDAY AT BEDTIME 90 tablet 2   zolpidem (AMBIEN) 10 MG tablet Take 1 tablet (10 mg total) by mouth at bedtime as needed. for sleep 30 tablet 2   No current facility-administered medications for this visit.    Allergies  Allergen Reactions   Prednazoline Other (See Comments)   Prednisone    Sulfa Antibiotics Other (See Comments)    Unknown    Family History  Problem Relation Age of Onset   COPD Mother    Heart disease Mother    Stroke Mother    Heart disease Father    COPD Father    Cancer Father        throat   Healthy Brother    Drug abuse Brother    Healthy Son     Social History   Socioeconomic History   Marital status: Widowed    Spouse name: Not on file   Number of children: Not on file   Years of education: Not on file   Highest education level: Not on file  Occupational History   Occupation: retired  Tobacco Use   Smoking status: Never   Smokeless tobacco: Never  Vaping Use   Vaping Use: Never used  Substance and Sexual Activity   Alcohol use: No  Drug use: No   Sexual activity: Not Currently  Other Topics Concern   Not on file  Social History Narrative   Patient is a 65 y/o widow (2011) who lives independently at home. 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 She has one son, a daughter-in-law and a grandson that live locally.  She struggles with her relationships with mother and brother.   Social Determinants of Health   Financial Resource Strain: Not on file  Food Insecurity: Not on file  Transportation Needs: Not on file  Physical Activity: Not on file  Stress: Not on file  Social Connections: Not on file  Intimate Partner Violence: Not on file     Constitutional: Denies fever, malaise, fatigue, headache or abrupt weight changes.  Respiratory: Denies difficulty breathing, shortness of breath, cough or sputum production.   Cardiovascular: Denies chest pain, chest tightness, palpitations or  swelling in the hands or feet.  Gastrointestinal: Pt reports pelvic pain. Denies bloating or blood in the stool.  GU: Denies urgency, frequency, pain with urination, burning sensation, blood in urine, odor or discharge. Skin: Denies redness, rashes, lesions or ulcercations.  Neurological: Pt reports insomnia. Denies dizziness, difficulty with memory, difficulty with speech or problems with balance and coordination.    No other specific complaints in a complete review of systems (except as listed in HPI above).  Objective:   Physical Exam  BP 131/90 (BP Location: Right Arm, Patient Position: Sitting, Cuff Size: Normal)   Pulse 90   Temp (!) 97.5 F (36.4 C) (Temporal)   Resp 18   Ht 5\' 2"  (1.575 m)   Wt 275 lb 6.4 oz (124.9 kg)   SpO2 97%   BMI 50.37 kg/m   Wt Readings from Last 3 Encounters:  04/22/21 276 lb 4 oz (125.3 kg)  04/19/21 276 lb 9.6 oz (125.5 kg)  10/17/20 270 lb (122.5 kg)    General: Appears her stated age, obese, in NAD. Skin: Warm, dry and intact.  Cardiovascular: Normal rate and rhythm.  Pulmonary/Chest: Normal effort and positive vesicular breath sounds.  Abdomen: Soft and tender in bilateral lower quadrants. Normal bowel sounds. No distention or masses noted. Neurological: Alert and oriented.   BMET    Component Value Date/Time   NA 138 04/19/2021 1507   NA 138 11/14/2019 1451   NA 140 01/17/2015 1431   K 4.1 04/19/2021 1507   K 4.5 01/17/2015 1431   CL 103 04/19/2021 1507   CL 104 01/17/2015 1431   CO2 24 04/19/2021 1507   CO2 29 01/17/2015 1431   GLUCOSE 127 (H) 04/19/2021 1507   GLUCOSE 107 (H) 01/17/2015 1431   BUN 10 04/19/2021 1507   BUN 13 11/14/2019 1451   BUN 13 01/17/2015 1431   CREATININE 0.76 04/19/2021 1507   CALCIUM 9.7 04/19/2021 1507   CALCIUM 10.2 01/17/2015 1431   GFRNONAA >60 03/19/2020 0022   GFRNONAA >60 01/17/2015 1431   GFRAA >60 03/19/2020 0022   GFRAA >60 01/17/2015 1431    Lipid Panel     Component Value  Date/Time   CHOL 224 (H) 04/19/2021 1507   TRIG 160 (H) 04/19/2021 1507   HDL 66 04/19/2021 1507   CHOLHDL 3.4 04/19/2021 1507   VLDL 31 (H) 05/31/2017 0837   LDLCALC 130 (H) 04/19/2021 1507    CBC    Component Value Date/Time   WBC 10.5 03/19/2020 0022   RBC 4.96 03/19/2020 0022   HGB 14.4 03/19/2020 0022  HGB 15.3 11/14/2019 1451   HCT 43.8 03/19/2020 0022   HCT 44.6 11/14/2019 1451   PLT 369 03/19/2020 0022   PLT 343 11/14/2019 1451   MCV 88.3 03/19/2020 0022   MCV 88 11/14/2019 1451   MCV 89 01/17/2015 1431   MCH 29.0 03/19/2020 0022   MCHC 32.9 03/19/2020 0022   RDW 13.9 03/19/2020 0022   RDW 13.3 11/14/2019 1451   RDW 13.5 01/17/2015 1431   LYMPHSABS 1.9 11/14/2019 1451   MONOABS 528 05/31/2017 0837   EOSABS 0.2 11/14/2019 1451   BASOSABS 0.0 11/14/2019 1451    Hgb A1C Lab Results  Component Value Date   HGBA1C 5.9 (H) 04/19/2021           Assessment & Plan:   Pelvic Pain:  Will obtain pelvic/transvaginal ultrasound for further evaluation No indication to repeat pap at this time She will follow up with GYN as previously scheduled  Will follow up after imaging with further recommendations and treatment plan Webb Silversmith, NP This visit occurred during the SARS-CoV-2 public health emergency.  Safety protocols were in place, including screening questions prior to the visit, additional usage of staff PPE, and extensive cleaning of exam room while observing appropriate contact time as indicated for disinfecting solutions.

## 2021-08-16 NOTE — Assessment & Plan Note (Signed)
Ambien refilled today 

## 2021-08-22 ENCOUNTER — Ambulatory Visit: Payer: Medicare Other

## 2021-08-24 ENCOUNTER — Other Ambulatory Visit: Payer: Self-pay

## 2021-08-24 ENCOUNTER — Ambulatory Visit
Admission: RE | Admit: 2021-08-24 | Discharge: 2021-08-24 | Disposition: A | Discharge status: Home / Self Care | Payer: Medicare Other | Source: Ambulatory Visit | Attending: Internal Medicine | Admitting: Internal Medicine

## 2021-08-24 DIAGNOSIS — N888 Other specified noninflammatory disorders of cervix uteri: Secondary | ICD-10-CM | POA: Diagnosis not present

## 2021-08-24 DIAGNOSIS — R102 Pelvic and perineal pain: Secondary | ICD-10-CM | POA: Diagnosis not present

## 2021-08-26 ENCOUNTER — Telehealth: Payer: Self-pay

## 2021-08-26 NOTE — Telephone Encounter (Signed)
Copied from [place] [phone]. Topic: General - Other >> Aug 26, 2021  2:08 PM Celene Kras wrote: Reason for CRM: Pt called and is requesting to have her results from her [place]. Please advise.

## 2021-08-28 NOTE — Telephone Encounter (Signed)
There should have been a result note on this. Ultrasound was normal. Nothing to explain her pelvic pain.

## 2021-09-06 ENCOUNTER — Telehealth: Payer: Self-pay

## 2021-09-06 NOTE — Telephone Encounter (Signed)
The pt would like to do a cologuard screening in place of a colonoscopy for her colon cancer screening. Her last colonoscopy done by Dr. Vira Agar at [facility] on 11/13/2016. Dr. Guss Bunde recommended for her to repeat it in 5 years.

## 2021-09-06 NOTE — Telephone Encounter (Signed)
Copied from [place] [phone]. Topic: General - Other >> Sep 05, 2021  3:13 PM Valere Dross wrote: Reason for CRM: Pt called in stating she was recently seen by pcp about her stomach, and wanted an update about getting a "polaguard" she had requested and wanted to see abut getting that, please advise.

## 2021-09-06 NOTE — Telephone Encounter (Signed)
With her having polyps requiring a 5 year recall, a Cologuard would not be indicated. Why does she not want to do the colonoscopy?

## 2021-09-13 NOTE — Telephone Encounter (Signed)
The pt was notified of the recommendation. She verbalize understanding, no questions or concerns.

## 2021-09-28 ENCOUNTER — Telehealth: Payer: Self-pay

## 2021-09-28 NOTE — Telephone Encounter (Signed)
Copied from [place] [phone]. Topic: General - Other >> Sep 28, 2021 12:05 PM Valere Dross wrote: Reason for CRM: Pt called in stating she wanted her ultrasound sent over to [facility] at fax [phone], For her New patient appt 12/19 at 3:30 please advise.

## 2021-09-28 NOTE — Telephone Encounter (Signed)
The pt was notified that I faxed over her ultrasound to [facility].

## 2021-10-03 DIAGNOSIS — R102 Pelvic and perineal pain: Secondary | ICD-10-CM | POA: Diagnosis not present

## 2021-10-14 ENCOUNTER — Other Ambulatory Visit: Payer: Self-pay | Admitting: Internal Medicine

## 2021-10-14 DIAGNOSIS — F418 Other specified anxiety disorders: Secondary | ICD-10-CM

## 2021-10-14 NOTE — Telephone Encounter (Signed)
Requested Prescriptions  Pending Prescriptions Disp Refills   escitalopram (LEXAPRO) 10 MG tablet [Pharmacy Med Name: ESCITALOPRAM 10 MG TABLET] 90 tablet 0    Sig: TAKE 1 TABLET BY MOUTH EVERY DAY     Psychiatry:  Antidepressants - SSRI Passed - 10/14/2021  1:27 AM      Passed - Completed PHQ-2 or PHQ-9 in the last 360 days      Passed - Valid encounter within last 6 months    Recent Outpatient Visits          1 month ago Need for immunization against influenza   [facility] [place], Coralie Keens, NP   5 months ago [facility] [place], Coralie Keens, NP   1 year ago Anxiety with depression   [place], FNP   1 year ago Pharyngitis, unspecified etiology   [place], FNP   1 year ago Acute otitis externa of right ear, unspecified type   [facility], Lupita Raider, FNP

## 2021-11-02 DIAGNOSIS — M792 Neuralgia and neuritis, unspecified: Secondary | ICD-10-CM | POA: Diagnosis not present

## 2021-11-02 DIAGNOSIS — G894 Chronic pain syndrome: Secondary | ICD-10-CM | POA: Diagnosis not present

## 2021-11-02 DIAGNOSIS — Z79899 Other long term (current) drug therapy: Secondary | ICD-10-CM | POA: Diagnosis not present

## 2021-12-02 ENCOUNTER — Other Ambulatory Visit: Payer: Self-pay | Admitting: Internal Medicine

## 2021-12-02 DIAGNOSIS — N3942 Incontinence without sensory awareness: Secondary | ICD-10-CM

## 2021-12-02 DIAGNOSIS — N3281 Overactive bladder: Secondary | ICD-10-CM

## 2021-12-02 DIAGNOSIS — R350 Frequency of micturition: Secondary | ICD-10-CM

## 2021-12-02 NOTE — Telephone Encounter (Signed)
Requested Prescriptions  Pending Prescriptions Disp Refills   oxybutynin (DITROPAN XL) 15 MG 24 hr tablet [Pharmacy Med Name: OXYBUTYNIN CL ER 15 MG TABLET] 90 tablet 0    Sig: TAKE 1 TABLET BY MOUTH EVERYDAY AT BEDTIME     Urology:  Bladder Agents Passed - 12/02/2021  1:56 AM      Passed - Valid encounter within last 12 months    Recent Outpatient Visits          3 months ago Need for immunization against influenza   [facility] [place], Coralie Keens, NP   7 months ago [facility] [place], Coralie Keens, NP   1 year ago Anxiety with depression   [place], FNP   1 year ago Pharyngitis, unspecified etiology   [place], FNP   1 year ago Acute otitis externa of right ear, unspecified type   [facility], Lupita Raider, FNP

## 2021-12-06 DIAGNOSIS — Z86010 Personal history of colonic polyps: Secondary | ICD-10-CM | POA: Diagnosis not present

## 2021-12-06 DIAGNOSIS — K219 Gastro-esophageal reflux disease without esophagitis: Secondary | ICD-10-CM | POA: Insufficient documentation

## 2021-12-06 DIAGNOSIS — R194 Change in bowel habit: Secondary | ICD-10-CM | POA: Insufficient documentation

## 2021-12-15 ENCOUNTER — Other Ambulatory Visit: Payer: Self-pay | Admitting: Nurse Practitioner

## 2021-12-15 DIAGNOSIS — R1031 Right lower quadrant pain: Secondary | ICD-10-CM

## 2021-12-15 DIAGNOSIS — R1032 Left lower quadrant pain: Secondary | ICD-10-CM

## 2021-12-16 DIAGNOSIS — R194 Change in bowel habit: Secondary | ICD-10-CM | POA: Diagnosis not present

## 2021-12-16 DIAGNOSIS — R1031 Right lower quadrant pain: Secondary | ICD-10-CM | POA: Diagnosis not present

## 2021-12-16 DIAGNOSIS — R1032 Left lower quadrant pain: Secondary | ICD-10-CM | POA: Diagnosis not present

## 2021-12-22 ENCOUNTER — Ambulatory Visit
Admission: RE | Admit: 2021-12-22 | Discharge: 2021-12-22 | Disposition: A | Discharge status: Home / Self Care | Payer: Medicare Other | Source: Ambulatory Visit | Attending: Nurse Practitioner | Admitting: Nurse Practitioner

## 2021-12-22 ENCOUNTER — Other Ambulatory Visit: Payer: Self-pay

## 2021-12-22 DIAGNOSIS — R1032 Left lower quadrant pain: Secondary | ICD-10-CM | POA: Diagnosis not present

## 2021-12-22 DIAGNOSIS — R109 Unspecified abdominal pain: Secondary | ICD-10-CM | POA: Diagnosis not present

## 2021-12-22 DIAGNOSIS — I700 Atherosclerosis of aorta: Secondary | ICD-10-CM | POA: Diagnosis not present

## 2021-12-22 DIAGNOSIS — N1330 Unspecified hydronephrosis: Secondary | ICD-10-CM | POA: Diagnosis not present

## 2021-12-22 DIAGNOSIS — R1031 Right lower quadrant pain: Secondary | ICD-10-CM | POA: Insufficient documentation

## 2021-12-22 LAB — POCT I-STAT CREATININE: Creatinine, Ser: 0.9 mg/dL (ref 0.44–1.00)

## 2021-12-22 MED ORDER — IOHEXOL 300 MG/ML  SOLN
100.0000 mL | Freq: Once | INTRAMUSCULAR | Status: AC | PRN
  Administered 2021-12-22: 17:00:00 100 mL via INTRAVENOUS

## 2022-01-10 ENCOUNTER — Encounter: Payer: Self-pay | Admitting: Physician Assistant

## 2022-01-10 ENCOUNTER — Other Ambulatory Visit: Payer: Self-pay

## 2022-01-10 ENCOUNTER — Ambulatory Visit: Payer: Self-pay

## 2022-01-10 ENCOUNTER — Ambulatory Visit (INDEPENDENT_AMBULATORY_CARE_PROVIDER_SITE_OTHER): Payer: Medicare Other | Admitting: Physician Assistant

## 2022-01-10 VITALS — BP 124/76 | HR 79 | Temp 97.1°F | Wt 278.0 lb

## 2022-01-10 DIAGNOSIS — R110 Nausea: Secondary | ICD-10-CM

## 2022-01-10 DIAGNOSIS — R42 Dizziness and giddiness: Secondary | ICD-10-CM | POA: Diagnosis not present

## 2022-01-10 DIAGNOSIS — F332 Major depressive disorder, recurrent severe without psychotic features: Secondary | ICD-10-CM

## 2022-01-10 MED ORDER — ONDANSETRON HCL 4 MG PO TABS: 4.0000 mg | ORAL_TABLET | Freq: Three times a day (TID) | ORAL | 0 refills | Status: DC | PRN

## 2022-01-10 MED ORDER — DULOXETINE HCL 20 MG PO CPEP: 20.0000 mg | ORAL_CAPSULE | Freq: Every day | ORAL | 1 refills | Status: DC

## 2022-01-10 NOTE — Assessment & Plan Note (Signed)
Unsure of chronicity, newly reported concern ?Patient reports episodes of dizziness with ambulation and changes in position.  ?Orthostatic BP performed and demonstrated approx 24 mmHg drop in systolic pressure from sitting to standing ?Discussed this with patient  ?Recommend increasing hydration and water intake to improve overall BP, changing positions slowly and with aids if needed to prevent increased dizziness ?Discussed that medication changes may be needed if this continues despite conservative measures.  ?Follow up recommended in approx 4 -6 weeks to evaluate  ?

## 2022-01-10 NOTE — Telephone Encounter (Signed)
?  Chief Complaint: Dizziness/vertigo ?Symptoms: IBID ?Frequency: 1 week ?Pertinent Negatives: Patient denies one sided weakness ?Disposition: '[]'$ ED /'[]'$ Urgent Care (no appt availability in office) / '[x]'$ Appointment(In office/virtual)/ '[]'$  [place] [facility]/ '[]'$ Home Care/ '[]'$ Refused Recommended Disposition /'[]'$ [place] [facility]/ '[]'$  Follow-up with PCP ?Additional Notes: Pt states she ha something similar before and was diagnosed with vertigo. Went to ED at that time by EMS - was told wait would be 5+ hours - left without being seen. This seems more mild. Pt states she can walk. ? ? ?Reason for Disposition ? [1] MODERATE dizziness (e.g., vertigo; feels very unsteady, interferes with normal activities) AND [2] has NOT been evaluated by physician for this ? ?Answer Assessment - Initial Assessment Questions ?1. DESCRIPTION: "Describe your dizziness." ?    Can hear heart beat - dizzy ?2. VERTIGO: "Do you feel like either you or the room is spinning or tilting?"  ?    no ?3. LIGHTHEADED: "Do you feel lightheaded?" (e.g., somewhat faint, woozy, weak upon standing) ?    yes ?4. SEVERITY: "How bad is it?"  "Can you walk?" ?  - MILD: Feels slightly dizzy and unsteady, but is walking normally. ?  - MODERATE: Feels unsteady when walking, but not falling; interferes with normal activities (e.g., school, work). ?  - SEVERE: Unable to walk without falling, or requires assistance to walk without falling. ?    mild ?5. ONSET:  "When did the dizziness begin?" ?    1 week ago  ?6. AGGRAVATING FACTORS: "Does anything make it worse?" (e.g., standing, change in head position) ?    sanding ?7. CAUSE: "What do you think is causing the dizziness?" ?    Ear infection ?8. RECURRENT SYMPTOM: "Have you had dizziness before?" If Yes, ask: "When was the last time?" "What happened that time?" ?    vertigo ?9. OTHER SYMPTOMS: "Do you have any other symptoms?" (e.g., headache, weakness, numbness, vomiting, earache) ?    no ?10. PREGNANCY: "Is  there any chance you are pregnant?" "When was your last menstrual period?" ?      na ? ?Protocols used: Dizziness - Vertigo-A-AH ? ?

## 2022-01-10 NOTE — Patient Instructions (Addendum)
Based on your dizziness symptoms I think there could be a few explanations as to what is going on.  ?We will check your blood pressures today and make sure this isn't related to blood pressure drops caused by changes in positions ?If your dizziness is caused by this we may need to re-evaluate your medications as some of them can cause dizziness ?You can also help prevent this kind of dizziness by making position changes slowly, staying well hydrated.  ? ? In order to assist with your depression I am adding a medication called Duloxetine (Cymbalta) 20 mg to be taken by mouth once per day  ?I recommend taking it in the morning to prevent trouble sleeping.  ? ?I am sending in a script for something called Zofran to assist with your nausea, please take this instead of the meclizine.  ?

## 2022-01-10 NOTE — Assessment & Plan Note (Signed)
Chronic, ongoing concern ?Patient was previously taking Meclizine for this  ?Recommend switching to Zofran 4 mg PO PRN QID instead to assist with nausea and reduce potential for dizziness  ?Meclizine also on BEERS criteria and with orthostatic dizziness in effect, I would like to reduce risk of drowsiness and added dizziness  ?Follow up as needed for continued symptoms or those not responding to management plan ?

## 2022-01-10 NOTE — Assessment & Plan Note (Signed)
Ongoing concern, appears to be progressing  ?PHQ9 score of 20 today- admits to passive SI, denies active suicidal thoughts or plan  ?Patient reports she has tried LExapro and Sertraline in the past without noticeable effects on her mood ?Discussed trying Duloxetine 20 mg PO QD today  ?Patient is amenable to this  ?States she is scheduled to talk with a hospice grief counselor in the coming days to help with her grief and associated depressed mood ?Recommend follow up in 6 weeks to discuss response to medication changes ? ?

## 2022-01-10 NOTE — Progress Notes (Signed)
? ? ?  ?    Acute Office Visit ? ? ?Patient: Denise Glass   DOB: 08/06/1956   66 y.o. Female  MRN: [id number] ?Visit Date: 01/10/2022 ? ?Today's healthcare provider: Dani Gobble Rosalba Totty, PA-C  ?Introduced myself to the patient as a Journalist, newspaper and provided education on APPs in clinical practice.  ? ? ?Chief Complaint  ?Patient presents with  ? Dizziness  ? ?Subjective  ?  ?Dizziness ?Associated symptoms include headaches. Pertinent negatives include no chills, congestion, fatigue, fever or neck pain.   ? ? ?States she is having a lot of dizziness and feels like she is having these episodes more frequently ?Right side of her head hurts intermittently and when this happens she notices an increase in her dizziness ?Reports it seems to happen when she stands up more than anything else ?Reports ear pressure on the right side as well- states she hears and feels heartbeat in her right ear - Sat night ? ? ?Depression ?Reports sadness and grief has contineud for the past 3.5 years since she unexpectedly lost her boyfriend ?States she stopped taking Lexapro because she could not tell much benefit from using  ?Same with Sertraline '100mg'$  in 2019 ?Vehemently denies active SI today, admits to passive SI ?Denies plan to hurt herself  ? ? ? ?Medications: ?Outpatient Medications Prior to Visit  ?Medication Sig  ? diphenhydrAMINE HCl (BENADRYL ALLERGY PO) Take by mouth.  ? HYDROcodone-acetaminophen (NORCO/VICODIN) 5-325 MG tablet Take 1 tablet by mouth 2 (two) times daily.  ? oxybutynin (DITROPAN XL) 15 MG 24 hr tablet TAKE 1 TABLET BY MOUTH EVERYDAY AT BEDTIME  ? simvastatin (ZOCOR) 10 MG tablet TAKE 1 TABLET BY MOUTH EVERYDAY AT BEDTIME  ? zolpidem (AMBIEN) 10 MG tablet Take 1 tablet (10 mg total) by mouth at bedtime as needed. for sleep  ? [DISCONTINUED] meclizine (ANTIVERT) 25 MG tablet meclizine 25 mg tablet ? TAKE 1 TABLET BY MOUTH EVERY 8 HOURS AS NEEDED FOR NAUSEA AND VOMITING  ? [DISCONTINUED] escitalopram (LEXAPRO) 10 MG tablet TAKE 1  TABLET BY MOUTH EVERY DAY (Patient not taking: Reported on 01/10/2022)  ? [DISCONTINUED] omeprazole (PRILOSEC OTC) 20 MG tablet Take 20 mg by mouth at bedtime.  ? ?No facility-administered medications prior to visit.  ? ? ?Review of Systems  ?Constitutional:  Negative for chills, fatigue and fever.  ?HENT:  Negative for congestion, ear pain, hearing loss, sinus pressure and sinus pain.   ?     Ear pressure ?  ?Eyes:  Positive for discharge. Negative for pain, itching and visual disturbance.  ?Musculoskeletal:  Negative for neck pain and neck stiffness.  ?Neurological:  Positive for dizziness and headaches. Negative for syncope and light-headedness.  ?Psychiatric/Behavioral:  Negative for confusion.   ? ? ?  01/10/2022  ?  2:01 PM 04/19/2021  ?  2:48 PM 03/05/2020  ?  2:48 PM 08/25/2019  ?  1:24 PM 04/29/2019  ?  5:00 PM  ?Depression screen PHQ 2/9  ?Decreased Interest '3 3 3 2 3  '$ ?Down, Depressed, Hopeless '3 3 3 1 3  '$ ?PHQ - 2 Score '6 6 6 3 6  '$ ?Altered sleeping '3 3 3 3 3  '$ ?Tired, decreased energy '3 3 3 1 3  '$ ?Change in appetite 3 2 0 0 3  ?Feeling bad or failure about yourself  '3 2 1 '$ 0 3  ?Trouble concentrating 0 1 3 0 2  ?Moving slowly or fidgety/restless 0 0 0 0 0  ?Suicidal thoughts 2 0 0  0 0  ?PHQ-9 Score '20 17 16 7 20  '$ ?Difficult doing work/chores Not difficult at all Extremely dIfficult Very difficult Somewhat difficult Extremely dIfficult  ? ? ? ? ?  Objective  ?  ?BP 124/76 (BP Location: Right Wrist, Patient Position: Sitting, Cuff Size: Normal)   Pulse 79   Temp (!) 97.1 ?F (36.2 ?C) (Temporal)   Wt 278 lb (126.1 kg)   SpO2 94%   BMI 50.85 kg/m?  ? ? ?Physical Exam ?Constitutional:   ?   Appearance: Normal appearance. She is obese.  ?HENT:  ?   Head: Normocephalic and atraumatic.  ?Eyes:  ?   General: Lids are normal.  ?   Extraocular Movements: Extraocular movements intact.  ?   Right eye: No nystagmus.  ?   Left eye: No nystagmus.  ?   Conjunctiva/sclera: Conjunctivae normal.  ?   Pupils: Pupils are equal,  round, and reactive to light.  ?Cardiovascular:  ?   Rate and Rhythm: Normal rate and regular rhythm.  ?   Pulses: Normal pulses.  ?   Heart sounds: Normal heart sounds.  ?Pulmonary:  ?   Effort: Pulmonary effort is normal. No respiratory distress.  ?   Breath sounds: Normal breath sounds. No stridor. No wheezing, rhonchi or rales.  ?Musculoskeletal:  ?   Cervical back: Normal range of motion and neck supple.  ?Neurological:  ?   Mental Status: She is alert.  ?Psychiatric:     ?   Attention and Perception: Attention and perception normal.     ?   Mood and Affect: Mood normal. Affect is tearful.     ?   Speech: Speech normal.     ?   Behavior: Behavior normal. Behavior is cooperative.     ?   Thought Content: Thought content normal.     ?   Judgment: Judgment normal.  ?   Comments: Patient became tearful while discussing mood and depression screening results   ?  ? ? ?No results found for any visits on 01/10/22. ? Assessment & Plan  ?  ? ?Problem List Items Addressed This Visit   ? ?  ? Other  ? Orthostatic dizziness  ?  Unsure of chronicity, newly reported concern ?Patient reports episodes of dizziness with ambulation and changes in position.  ?Orthostatic BP performed and demonstrated approx 24 mmHg drop in systolic pressure from sitting to standing ?Discussed this with patient  ?Recommend increasing hydration and water intake to improve overall BP, changing positions slowly and with aids if needed to prevent increased dizziness ?Discussed that medication changes may be needed if this continues despite conservative measures.  ?Follow up recommended in approx 4 -6 weeks to evaluate  ?  ?  ? Severe episode of recurrent major depressive disorder, without psychotic features ([place]) - Primary  ?  Ongoing concern, appears to be progressing  ?PHQ9 score of 20 today- admits to passive SI, denies active suicidal thoughts or plan  ?Patient reports she has tried LExapro and Sertraline in the past without noticeable effects on  her mood ?Discussed trying Duloxetine 20 mg PO QD today  ?Patient is amenable to this  ?States she is scheduled to talk with a hospice grief counselor in the coming days to help with her grief and associated depressed mood ?Recommend follow up in 6 weeks to discuss response to medication changes ? ?  ?  ? Relevant Medications  ? DULoxetine (CYMBALTA) 20 MG capsule  ? Nausea  ?  Chronic, ongoing concern ?Patient was previously taking Meclizine for this  ?Recommend switching to Zofran 4 mg PO PRN QID instead to assist with nausea and reduce potential for dizziness  ?Meclizine also on BEERS criteria and with orthostatic dizziness in effect, I would like to reduce risk of drowsiness and added dizziness  ?Follow up as needed for continued symptoms or those not responding to management plan ?  ?  ? Relevant Medications  ? ondansetron (ZOFRAN) 4 MG tablet  ? ? ? ?Return in about 6 weeks (around 02/21/2022) for Depression follow up . ? ? ?I, Kiyaan Haq E Shahan Starks, PA-C, have reviewed all documentation for this visit. The documentation on 01/10/22 for the exam, diagnosis, procedures, and orders are all accurate and complete. ? ? ?Dianey Suchy, [place]ie [place] MPH ?[place] ? [facility] ? ? ?Return in about 6 weeks (around 02/21/2022) for Depression follow up .  ?   ? ? ? ? ?

## 2022-02-10 ENCOUNTER — Ambulatory Visit (INDEPENDENT_AMBULATORY_CARE_PROVIDER_SITE_OTHER): Payer: Medicare Other

## 2022-02-10 VITALS — Wt 278.0 lb

## 2022-02-10 DIAGNOSIS — Z0000 Encounter for general adult medical examination without abnormal findings: Secondary | ICD-10-CM

## 2022-02-10 NOTE — Progress Notes (Signed)
?Virtual Visit via Telephone Note ? ?I connected with  Denise Glass on 02/10/22 at  1:45 PM EDT by telephone and verified that I am speaking with the correct person using two identifiers. ? ?Location: ?Patient: home ?Provider: [facility] ?Persons participating in the virtual visit: patient/Nurse Health Advisor ?  ?I discussed the limitations, risks, security and privacy concerns of performing an evaluation and management service by telephone and the availability of in person appointments. The patient expressed understanding and agreed to proceed. ? ?Interactive audio and video telecommunications were attempted between this nurse and patient, however failed, due to patient having technical difficulties OR patient did not have access to video capability.  We continued and completed visit with audio only. ? ?Some vital signs may be absent or patient reported.  ? ?Dionisio David, LPN ? ?Subjective:  ? Denise Glass is a 66 y.o. female who presents for Medicare Annual (Subsequent) preventive examination. ? ?Review of Systems    ? ?  ? ?   ?Objective:  ?  ?There were no vitals filed for this visit. ?There is no height or weight on file to calculate BMI. ? ? ?  10/17/2020  ?  4:46 PM 03/19/2020  ? 12:20 AM 07/01/2019  ?  4:25 PM 08/22/2017  ?  6:13 AM 08/07/2017  ?  9:20 AM  ?Advanced Directives  ?Does Patient Have a Medical Advance Directive? No No No No No  ?Would patient like information on creating a medical advance directive?   No - Patient declined No - Patient declined No - Patient declined  ? ? ?Current Medications (verified) ?Outpatient Encounter Medications as of 02/10/2022  ?Medication Sig  ? busPIRone (BUSPAR) 5 MG tablet Take by mouth.  ? meclizine (ANTIVERT) 25 MG tablet Take by mouth.  ? Na Sulfate-K Sulfate-Mg Sulf 17.5-3.13-1.6 GM/177ML SOLN Take by mouth.  ? omeprazole (PRILOSEC) 20 MG capsule Take by mouth.  ? oxyCODONE (OXY IR/ROXICODONE) 5 MG immediate release tablet   ? sertraline (ZOLOFT) 50 MG tablet Take 1  tablet by mouth daily.  ? simvastatin (ZOCOR) 10 MG tablet Take by mouth.  ? cyclobenzaprine (FLEXERIL) 10 MG tablet Take 10 mg by mouth 3 (three) times daily.  ? diphenhydrAMINE HCl (BENADRYL ALLERGY PO) Take by mouth.  ? DULoxetine (CYMBALTA) 20 MG capsule Take 1 capsule (20 mg total) by mouth daily.  ? HYDROcodone-acetaminophen (NORCO/VICODIN) 5-325 MG tablet Take 1 tablet by mouth 2 (two) times daily.  ? ibuprofen (ADVIL) 200 MG tablet Take by mouth.  ? ondansetron (ZOFRAN) 4 MG tablet Take 1 tablet (4 mg total) by mouth every 8 (eight) hours as needed for nausea or vomiting.  ? oxybutynin (DITROPAN XL) 15 MG 24 hr tablet TAKE 1 TABLET BY MOUTH EVERYDAY AT BEDTIME  ? oxybutynin (DITROPAN-XL) 10 MG 24 hr tablet Take 1 tablet by mouth daily.  ? simvastatin (ZOCOR) 10 MG tablet TAKE 1 TABLET BY MOUTH EVERYDAY AT BEDTIME  ? traMADol (ULTRAM) 50 MG tablet Take 50 mg by mouth 2 (two) times daily.  ? traMADol (ULTRAM) 50 MG tablet tramadol 50 mg tablet ? Take 1 tablet twice a day by oral route for 30 days.  ? zolpidem (AMBIEN) 10 MG tablet Take 1 tablet (10 mg total) by mouth at bedtime as needed. for sleep  ? ?No facility-administered encounter medications on file as of 02/10/2022.  ? ? ?Allergies (verified) ?Prednazoline, Prednisone, and Sulfa antibiotics  ? ?History: ?Past Medical History:  ?Diagnosis Date  ? Complication of anesthesia   ?  pt reports her appetite takes 2-3 weeks to come back after anesthesia.  ? Depression   ? DJD (degenerative joint disease)   ? GERD (gastroesophageal reflux disease)   ? Headache 2016  ? cluster head aches, Head aches have subsided.  ? Hypertension   ? Hypothyroidism   ? Insomnia   ? Mitral valve prolapse 2003  ? Followed by Dr. Clayborn Bigness (prn)  ? PONV (postoperative nausea and vomiting) 2002  ? With BTL  ? Urine frequency   ? ?Past Surgical History:  ?Procedure Laterality Date  ? BREAST BIOPSY  2001  ? Calcification - bx done by Dr. Raylene Everts  ? BREAST SURGERY  2001  ? biopsy  ?  COLONOSCOPY WITH PROPOFOL N/A 11/13/2016  ? Procedure: COLONOSCOPY WITH PROPOFOL;  Surgeon: Manya Silvas, MD;  Location: [facility] ENDOSCOPY;  Service: Endoscopy;  Laterality: N/A;  ? JOINT REPLACEMENT  2008  ? left and right knee replaced  ? KNEE ARTHROPLASTY  2007  ? Right Knee  ? KNEE ARTHROPLASTY  2008  ? Left Knee  ? left total knee replacement    ? right total knee    ? TOTAL HIP ARTHROPLASTY Right 08/22/2017  ? Procedure: TOTAL HIP ARTHROPLASTY;  Surgeon: Dereck Leep, MD;  Location: [facility] ORS;  Service: Orthopedics;  Laterality: Right;  ? TUBAL LIGATION    ? ?Family History  ?Problem Relation Age of Onset  ? COPD Mother   ? Heart disease Mother   ? Stroke Mother   ? Heart disease Father   ? COPD Father   ? Cancer Father   ?     throat  ? Healthy Brother   ? Drug abuse Brother   ? Healthy Son   ? ?Social History  ? ?Socioeconomic History  ? Marital status: Widowed  ?  Spouse name: Not on file  ? Number of children: Not on file  ? Years of education: Not on file  ? Highest education level: Not on file  ?Occupational History  ? Occupation: retired  ?Tobacco Use  ? Smoking status: Never  ? Smokeless tobacco: Never  ?Vaping Use  ? Vaping Use: Never used  ?Substance and Sexual Activity  ? Alcohol use: No  ? Drug use: No  ? Sexual activity: Not Currently  ?Other Topics Concern  ? Not on file  ?Social History Narrative  ? Patient is a 66 y/o widow (2011) who lives independently at home. 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 She has one son, a daughter-in-law and a grandson that live locally.  She struggles with her relationships with mother and brother.  ? ?Social Determinants of Health  ? ?Financial Resource Strain: Not on file  ?Food Insecurity: Not on file  ?Transportation Needs: Not on file  ?Physical Activity: Not on file  ?Stress: Not on file  ?Social Connections: Not on file  ? ? ?Tobacco Counseling ?Counseling given: Not Answered ? ? ?Clinical Intake: ? ?Pre-visit  preparation completed: Yes ? ?Pain : No/denies pain ? ?  ? ?Nutritional Risks: None ?Diabetes: No ? ?How often do you need to have someone help you when you read instructions, pamphlets, or other written materials from your doctor or pharmacy?: 1 - Never ? ?Diabetic?no ? ?Interpreter Needed?: No ? ?Information entered by :: Kirke Shaggy, LPN ? ? ?Activities of Daily Living ? ?  01/10/2022  ?  2:01 PM 04/19/2021  ?  2:49 PM  ?In your present state of health, do  you have any difficulty performing the following activities:  ?Hearing? 0 0  ?Vision? 0 0  ?Difficulty concentrating or making decisions? 0 0  ?Walking or climbing stairs? 0 1  ?Dressing or bathing? 0 0  ?Doing errands, shopping? 0 0  ? ? ?Patient Care Team: ?Jearld Fenton, NP as PCP - General (Internal Medicine) ?Minna Merritts, MD as PCP - Cardiology (Cardiology) ? ?Indicate any recent Medical Services you may have received from other than [facility] providers in the past year (date may be approximate). ? ?   ?Assessment:  ? This is a routine wellness examination for [place]. ? ?Hearing/Vision screen ?No results found. ? ?Dietary issues and exercise activities discussed: ?  ? ? Goals Addressed   ?None ?  ? ?Depression Screen ? ?  01/10/2022  ?  2:01 PM 04/19/2021  ?  2:48 PM 03/05/2020  ?  2:48 PM 08/25/2019  ?  1:24 PM 04/29/2019  ?  5:00 PM 09/25/2018  ? 11:16 AM 02/26/2018  ?  9:29 AM  ?PHQ 2/9 Scores  ?PHQ - 2 Score '6 6 6 3 6 6 3  '$ ?PHQ- 9 Score '20 17 16 7 20 19 15  '$ ?  ?Fall Risk ? ?  01/10/2022  ?  2:00 PM 04/19/2021  ?  2:49 PM 08/25/2019  ?  1:23 PM 10/30/2017  ? 11:09 AM 03/13/2017  ?  8:47 AM  ?Fall Risk   ?Falls in the past year? 1 0 0 No No  ?Number falls in past yr: 0      ?Injury with Fall? 0 0     ?Risk for fall due to : No Fall Risks      ?Follow up Falls evaluation completed      ? ? ?FALL RISK PREVENTION PERTAINING TO THE HOME: ? ?Any stairs in or around the home? No  ?If so, are there any without handrails? No  ?Home free of loose throw rugs in walkways, pet  beds, electrical cords, etc? Yes  ?Adequate lighting in your home to reduce risk of falls? Yes  ? ?ASSISTIVE DEVICES UTILIZED TO PREVENT FALLS: ? ?Life alert? No  ?Use of a cane, walker or w/c? No  ?Grab ba

## 2022-02-10 NOTE — Patient Instructions (Signed)
Denise Glass , ?Thank you for taking time to come for your Medicare Wellness Visit. I appreciate your ongoing commitment to your health goals. Please review the following plan we discussed and let me know if I can assist you in the future.  ? ?Screening recommendations/referrals: ?Colonoscopy: 02/21/22 ?Mammogram: declined referral ?Bone Density: declined referral ?Recommended yearly ophthalmology/optometry visit for glaucoma screening and checkup ?Recommended yearly dental visit for hygiene and checkup ? ?Vaccinations: ?Influenza vaccine: 08/16/21 ?Pneumococcal vaccine: 07/07/20 ?Tdap vaccine: 09/23/12 ?Shingles vaccine: n/d   ?Covid-19:08/16/20 ? ?Advanced directives: no ? ?Conditions/risks identified: none ? ?Next appointment: Follow up in one year for your annual wellness visit 02/16/23 @ 11am by phone ? ? ?Preventive Care 39 Years and Older, Female ?Preventive care refers to lifestyle choices and visits with your health care provider that can promote health and wellness. ?What does preventive care include? ?A yearly physical exam. This is also called an annual well check. ?Dental exams once or twice a year. ?Routine eye exams. Ask your health care provider how often you should have your eyes checked. ?Personal lifestyle choices, including: ?Daily care of your teeth and gums. ?Regular physical activity. ?Eating a healthy diet. ?Avoiding tobacco and drug use. ?Limiting alcohol use. ?Practicing safe sex. ?Taking low-dose aspirin every day. ?Taking vitamin and mineral supplements as recommended by your health care provider. ?What happens during an annual well check? ?The services and screenings done by your health care provider during your annual well check will depend on your age, overall health, lifestyle risk factors, and family history of disease. ?Counseling  ?Your health care provider may ask you questions about your: ?Alcohol use. ?Tobacco use. ?Drug use. ?Emotional well-being. ?Home and relationship  well-being. ?Sexual activity. ?Eating habits. ?History of falls. ?Memory and ability to understand (cognition). ?Work and work Statistician. ?Reproductive health. ?Screening  ?You may have the following tests or measurements: ?Height, weight, and BMI. ?Blood pressure. ?Lipid and cholesterol levels. These may be checked every 5 years, or more frequently if you are over 69 years old. ?Skin check. ?Lung cancer screening. You may have this screening every year starting at age 1 if you have a 30-pack-year history of smoking and currently smoke or have quit within the past 15 years. ?Fecal occult blood test (FOBT) of the stool. You may have this test every year starting at age 16. ?Flexible sigmoidoscopy or colonoscopy. You may have a sigmoidoscopy every 5 years or a colonoscopy every 10 years starting at age 18. ?Hepatitis C blood test. ?Hepatitis B blood test. ?Sexually transmitted disease (STD) testing. ?Diabetes screening. This is done by checking your blood sugar (glucose) after you have not eaten for a while (fasting). You may have this done every 1-3 years. ?Bone density scan. This is done to screen for osteoporosis. You may have this done starting at age 71. ?Mammogram. This may be done every 1-2 years. Talk to your health care provider about how often you should have regular mammograms. ?Talk with your health care provider about your test results, treatment options, and if necessary, the need for more tests. ?Vaccines  ?Your health care provider may recommend certain vaccines, such as: ?Influenza vaccine. This is recommended every year. ?Tetanus, diphtheria, and acellular pertussis (Tdap, Td) vaccine. You may need a Td booster every 10 years. ?Zoster vaccine. You may need this after age 30. ?Pneumococcal 13-valent conjugate (PCV13) vaccine. One dose is recommended after age 50. ?Pneumococcal polysaccharide (PPSV23) vaccine. One dose is recommended after age 37. ?Talk to your health care provider  about which  screenings and vaccines you need and how often you need them. ?This information is not intended to replace advice given to you by your health care provider. Make sure you discuss any questions you have with your health care provider. ?Document Released: 10/29/2015 Document Revised: 06/21/2016 Document Reviewed: 08/03/2015 ?Elsevier Interactive Patient Education ? 2017 [place]. ? ?Fall Prevention in the Home ?Falls can cause injuries. They can happen to people of all ages. There are many things you can do to make your home safe and to help prevent falls. ?What can I do on the outside of my home? ?Regularly fix the edges of walkways and driveways and fix any cracks. ?Remove anything that might make you trip as you walk through a door, such as a raised step or threshold. ?Trim any bushes or trees on the path to your home. ?Use bright outdoor lighting. ?Clear any walking paths of anything that might make someone trip, such as rocks or tools. ?Regularly check to see if handrails are loose or broken. Make sure that both sides of any steps have handrails. ?Any raised decks and porches should have guardrails on the edges. ?Have any leaves, snow, or ice cleared regularly. ?Use sand or salt on walking paths during winter. ?Clean up any spills in your garage right away. This includes oil or grease spills. ?What can I do in the bathroom? ?Use night lights. ?Install grab bars by the toilet and in the tub and shower. Do not use towel bars as grab bars. ?Use non-skid mats or decals in the tub or shower. ?If you need to sit down in the shower, use a plastic, non-slip stool. ?Keep the floor dry. Clean up any water that spills on the floor as soon as it happens. ?Remove soap buildup in the tub or shower regularly. ?Attach bath mats securely with double-sided non-slip rug tape. ?Do not have throw rugs and other things on the floor that can make you trip. ?What can I do in the bedroom? ?Use night lights. ?Make sure that you have a  light by your bed that is easy to reach. ?Do not use any sheets or blankets that are too big for your bed. They should not hang down onto the floor. ?Have a firm chair that has side arms. You can use this for support while you get dressed. ?Do not have throw rugs and other things on the floor that can make you trip. ?What can I do in the kitchen? ?Clean up any spills right away. ?Avoid walking on wet floors. ?Keep items that you use a lot in easy-to-reach places. ?If you need to reach something above you, use a strong step stool that has a grab bar. ?Keep electrical cords out of the way. ?Do not use floor polish or wax that makes floors slippery. If you must use wax, use non-skid floor wax. ?Do not have throw rugs and other things on the floor that can make you trip. ?What can I do with my stairs? ?Do not leave any items on the stairs. ?Make sure that there are handrails on both sides of the stairs and use them. Fix handrails that are broken or loose. Make sure that handrails are as long as the stairways. ?Check any carpeting to make sure that it is firmly attached to the stairs. Fix any carpet that is loose or worn. ?Avoid having throw rugs at the top or bottom of the stairs. If you do have throw rugs, attach them to the floor  with carpet tape. ?Make sure that you have a light switch at the top of the stairs and the bottom of the stairs. If you do not have them, ask someone to add them for you. ?What else can I do to help prevent falls? ?Wear shoes that: ?Do not have high heels. ?Have rubber bottoms. ?Are comfortable and fit you well. ?Are closed at the toe. Do not wear sandals. ?If you use a stepladder: ?Make sure that it is fully opened. Do not climb a closed stepladder. ?Make sure that both sides of the stepladder are locked into place. ?Ask someone to hold it for you, if possible. ?Clearly mark and make sure that you can see: ?Any grab bars or handrails. ?First and last steps. ?Where the edge of each step  is. ?Use tools that help you move around (mobility aids) if they are needed. These include: ?Canes. ?Walkers. ?Scooters. ?Crutches. ?Turn on the lights when you go into a dark area. Replace any light bulbs as soon as they burn

## 2022-02-20 ENCOUNTER — Encounter: Payer: Self-pay | Admitting: *Deleted

## 2022-02-21 ENCOUNTER — Other Ambulatory Visit: Payer: Self-pay

## 2022-02-21 ENCOUNTER — Encounter
Admission: RE | Disposition: A | Discharge status: Home / Self Care | Payer: Self-pay | Source: Home / Self Care | Attending: Gastroenterology

## 2022-02-21 ENCOUNTER — Ambulatory Visit: Payer: Medicare Other | Admitting: Anesthesiology

## 2022-02-21 ENCOUNTER — Ambulatory Visit
Admission: RE | Admit: 2022-02-21 | Discharge: 2022-02-21 | Disposition: A | Discharge status: Home / Self Care | Payer: Medicare Other | Attending: Gastroenterology | Admitting: Gastroenterology

## 2022-02-21 ENCOUNTER — Encounter: Payer: Self-pay | Admitting: *Deleted

## 2022-02-21 DIAGNOSIS — K449 Diaphragmatic hernia without obstruction or gangrene: Secondary | ICD-10-CM | POA: Diagnosis not present

## 2022-02-21 DIAGNOSIS — E039 Hypothyroidism, unspecified: Secondary | ICD-10-CM | POA: Diagnosis not present

## 2022-02-21 DIAGNOSIS — K209 Esophagitis, unspecified without bleeding: Secondary | ICD-10-CM | POA: Diagnosis not present

## 2022-02-21 DIAGNOSIS — I10 Essential (primary) hypertension: Secondary | ICD-10-CM | POA: Diagnosis not present

## 2022-02-21 DIAGNOSIS — Z96653 Presence of artificial knee joint, bilateral: Secondary | ICD-10-CM | POA: Insufficient documentation

## 2022-02-21 DIAGNOSIS — Z6841 Body Mass Index (BMI) 40.0 and over, adult: Secondary | ICD-10-CM | POA: Insufficient documentation

## 2022-02-21 DIAGNOSIS — E669 Obesity, unspecified: Secondary | ICD-10-CM | POA: Insufficient documentation

## 2022-02-21 DIAGNOSIS — Z96641 Presence of right artificial hip joint: Secondary | ICD-10-CM | POA: Insufficient documentation

## 2022-02-21 DIAGNOSIS — K2100 Gastro-esophageal reflux disease with esophagitis, without bleeding: Secondary | ICD-10-CM | POA: Insufficient documentation

## 2022-02-21 DIAGNOSIS — K317 Polyp of stomach and duodenum: Secondary | ICD-10-CM | POA: Diagnosis not present

## 2022-02-21 DIAGNOSIS — K219 Gastro-esophageal reflux disease without esophagitis: Secondary | ICD-10-CM | POA: Diagnosis not present

## 2022-02-21 DIAGNOSIS — E785 Hyperlipidemia, unspecified: Secondary | ICD-10-CM | POA: Diagnosis not present

## 2022-02-21 HISTORY — PX: ESOPHAGOGASTRODUODENOSCOPY (EGD) WITH PROPOFOL: SHX5813

## 2022-02-21 SURGERY — ESOPHAGOGASTRODUODENOSCOPY (EGD) WITH PROPOFOL
Anesthesia: General

## 2022-02-21 MED ORDER — PROPOFOL 500 MG/50ML IV EMUL
INTRAVENOUS | Status: DC | PRN
  Administered 2022-02-21: 150 ug/kg/min via INTRAVENOUS

## 2022-02-21 MED ORDER — PROPOFOL 10 MG/ML IV BOLUS
INTRAVENOUS | Status: DC | PRN
  Administered 2022-02-21: 30 mg via INTRAVENOUS
  Administered 2022-02-21: 40 mg via INTRAVENOUS
  Administered 2022-02-21: 60 mg via INTRAVENOUS

## 2022-02-21 MED ORDER — LIDOCAINE HCL (CARDIAC) PF 100 MG/5ML IV SOSY
PREFILLED_SYRINGE | INTRAVENOUS | Status: DC | PRN
  Administered 2022-02-21: 100 mg via INTRAVENOUS

## 2022-02-21 MED ORDER — SODIUM CHLORIDE 0.9 % IV SOLN: INTRAVENOUS | Status: DC

## 2022-02-21 NOTE — Op Note (Signed)
[facility] ?Gastroenterology ?Patient Name: Denise Glass ?Procedure Date: 02/21/2022 9:46 AM ?MRN: [id number] ?Account #: [id number] ?Date of Birth: [birth year] ?Admit Type: Outpatient ?Age: 66 ?Room: [facility] ENDO ROOM 1 ?Gender: Female ?Note Status: Finalized ?Instrument Name: Upper Endoscope [id number] ?Procedure:             Upper GI endoscopy ?Indications:           Gastro-esophageal reflux disease ?Providers:             Andrey Farmer MD, MD ?Referring MD:          Jearld Fenton (Referring MD) ?Medicines:             Monitored Anesthesia Care ?Complications:         No immediate complications. ?Procedure:             Pre-Anesthesia Assessment: ?                       - Prior to the procedure, a History and Physical was  ?                       performed, and patient medications and allergies were  ?                       reviewed. The patient is competent. The risks and  ?                       benefits of the procedure and the sedation options and  ?                       risks were discussed with the patient. All questions  ?                       were answered and informed consent was obtained.  ?                       Patient identification and proposed procedure were  ?                       verified by the physician, the nurse, the  ?                       anesthesiologist, the anesthetist and the technician  ?                       in the endoscopy suite. Mental Status Examination:  ?                       alert and oriented. Airway Examination: normal  ?                       oropharyngeal airway and neck mobility. Respiratory  ?                       Examination: clear to auscultation. CV Examination:  ?                       normal. Prophylactic Antibiotics: The patient does not  ?  require prophylactic antibiotics. Prior  ?                       Anticoagulants: The patient has taken no previous  ?                       anticoagulant or antiplatelet agents. ASA  Grade  ?                       Assessment: II - A patient with mild systemic disease.  ?                       After reviewing the risks and benefits, the patient  ?                       was deemed in satisfactory condition to undergo the  ?                       procedure. The anesthesia plan was to use monitored  ?                       anesthesia care (MAC). Immediately prior to  ?                       administration of medications, the patient was  ?                       re-assessed for adequacy to receive sedatives. The  ?                       heart rate, respiratory rate, oxygen saturations,  ?                       blood pressure, adequacy of pulmonary ventilation, and  ?                       response to care were monitored throughout the  ?                       procedure. The physical status of the patient was  ?                       re-assessed after the procedure. ?                       After obtaining informed consent, the endoscope was  ?                       passed under direct vision. Throughout the procedure,  ?                       the patient's blood pressure, pulse, and oxygen  ?                       saturations were monitored continuously. The Endoscope  ?                       was introduced through the mouth, and advanced to the  ?  second part of duodenum. The upper GI endoscopy was  ?                       accomplished without difficulty. The patient tolerated  ?                       the procedure well. ?Findings: ?     LA Grade A (one or more mucosal breaks less than 5 mm, not extending  ?     between tops of 2 mucosal folds) esophagitis with no bleeding was found. ?     A small hiatal hernia was present. ?     The exam of the esophagus was otherwise normal. ?     Multiple small sessile fundic gland polyps with no bleeding and no  ?     stigmata of recent bleeding were found in the gastric body. ?     The exam of the stomach was otherwise normal. ?     The  examined duodenum was normal. ?Impression:            - LA Grade A reflux esophagitis with no bleeding. ?                       - Small hiatal hernia. ?                       - Multiple fundic gland polyps. ?                       - Normal examined duodenum. ?                       - No specimens collected. ?Recommendation:        - Discharge patient to home. ?                       - Resume previous diet. ?                       - Continue present medications. ?                       - Return to referring physician as previously  ?                       scheduled. ?Procedure Code(s):     --- Professional --- ?                       [phone], Esophagogastroduodenoscopy, flexible,  ?                       transoral; diagnostic, including collection of  ?                       specimen(s) by brushing or washing, when performed  ?                       (separate procedure) ?Diagnosis Code(s):     --- Professional --- ?                       K21.00, Gastro-esophageal reflux disease with  ?  esophagitis, without bleeding ?                       K44.9, Diaphragmatic hernia without obstruction or  ?                       gangrene ?                       K31.7, Polyp of stomach and duodenum ?CPT copyright 2019 American Medical Association. All rights reserved. ?The codes documented in this report are preliminary and upon coder review may  ?be revised to meet current compliance requirements. ?Andrey Farmer MD, MD ?02/21/2022 9:59:55 AM ?Number of Addenda: 0 ?Note Initiated On: 02/21/2022 9:46 AM ?Estimated Blood Loss:  Estimated blood loss: none. ?     [facility] ?

## 2022-02-21 NOTE — Interval H&P Note (Signed)
History and Physical Interval Note: ? ?02/21/2022 ?9:46 AM ? ?Denise Glass  has presented today for surgery, with the diagnosis of GERD ?H/O Colonic Polyps.  The various methods of treatment have been discussed with the patient and family. After consideration of risks, benefits and other options for treatment, the patient has consented to  Procedure(s): ?ESOPHAGOGASTRODUODENOSCOPY (EGD) WITH PROPOFOL (N/A) as a surgical intervention.  The patient's history has been reviewed, patient examined, no change in status, stable for surgery.  I have reviewed the patient's chart and labs.  Questions were answered to the patient's satisfaction.   ? ? ?[name] ? ?Ok to proceed with EGD ?

## 2022-02-21 NOTE — Anesthesia Preprocedure Evaluation (Signed)
Anesthesia Evaluation  ?Patient identified by MRN, date of birth, ID band ?Patient awake ? ? ? ?Reviewed: ?Allergy & Precautions, H&P , NPO status , Patient's Chart, lab work & pertinent test results, reviewed documented beta blocker date and time  ? ?History of Anesthesia Complications ?(+) PONV and history of anesthetic complications ? ?Airway ?Mallampati: II ? ? ?Neck ROM: full ? ? ? Dental ? ?(+) Poor Dentition ?  ?Pulmonary ?neg pulmonary ROS,  ?  ?Pulmonary exam normal ? ? ? ? ? ? ? Cardiovascular ?Exercise Tolerance: Good ?hypertension, On Medications ?negative cardio ROS ?Normal cardiovascular exam ?Rhythm:regular Rate:Normal ? ? ?  ?Neuro/Psych ? Headaches, PSYCHIATRIC DISORDERS Anxiety Depression   ? GI/Hepatic ?Neg liver ROS, GERD  Medicated,  ?Endo/Other  ?Hypothyroidism  ? Renal/GU ?negative Renal ROS  ?negative genitourinary ?  ?Musculoskeletal ? ? Abdominal ?  ?Peds ? Hematology ?negative hematology ROS ?(+)   ?Anesthesia Other Findings ?Past Medical History: ?No date: Complication of anesthesia ?    Comment:  pt reports her appetite takes 2-3 weeks to come back  ?             after anesthesia. ?No date: Depression ?No date: DJD (degenerative joint disease) ?No date: GERD (gastroesophageal reflux disease) ?2016: Headache ?    Comment:  cluster head aches, Head aches have subsided. ?No date: Hypertension ?No date: Hypothyroidism ?No date: Insomnia ?2003: Mitral valve prolapse ?    Comment:  Followed by Dr. Clayborn Bigness (prn) ?2002: PONV (postoperative nausea and vomiting) ?    Comment:  With BTL ?No date: Urine frequency ?Past Surgical History: ?2001: BREAST BIOPSY ?    Comment:  Calcification - bx done by Dr. Raylene Everts ?2001: BREAST SURGERY ?    Comment:  biopsy ?11/13/2016: COLONOSCOPY WITH PROPOFOL; N/A ?    Comment:  Procedure: COLONOSCOPY WITH PROPOFOL;  Surgeon: Herbie Baltimore T ?             Vira Agar, MD;  Location: [place];  Service:  ?             Endoscopy;   Laterality: N/A; ?2008: JOINT REPLACEMENT ?    Comment:  left and right knee replaced ?2007: KNEE ARTHROPLASTY ?    Comment:  Right Knee ?2008: KNEE ARTHROPLASTY ?    Comment:  Left Knee ?No date: left total knee replacement ?No date: POLYPECTOMY ?    Comment:  colon ?No date: right total knee ?08/22/2017: TOTAL HIP ARTHROPLASTY; Right ?    Comment:  Procedure: TOTAL HIP ARTHROPLASTY;  Surgeon: Marry Guan,  ?             Laurice Record, MD;  Location: [facility];  Service: Orthopedics;  ?             Laterality: Right; ?No date: TUBAL LIGATION ? ? Reproductive/Obstetrics ?negative OB ROS ? ?  ? ? ? ? ? ? ? ? ? ? ? ? ? ?  ?  ? ? ? ? ? ? ? ? ?Anesthesia Physical ?Anesthesia Plan ? ?ASA: 3 ? ?Anesthesia Plan: General  ? ?Post-op Pain Management:   ? ?Induction:  ? ?PONV Risk Score and Plan:  ? ?Airway Management Planned:  ? ?Additional Equipment:  ? ?Intra-op Plan:  ? ?Post-operative Plan:  ? ?Informed Consent: I have reviewed the patients History and Physical, chart, labs and discussed the procedure including the risks, benefits and alternatives for the proposed anesthesia with the patient or authorized representative who has indicated his/her understanding and acceptance.  ? ? ? ?  Dental Advisory Given ? ?Plan Discussed with: CRNA ? ?Anesthesia Plan Comments:   ? ? ? ? ? ? ?Anesthesia Quick Evaluation ? ?

## 2022-02-21 NOTE — Transfer of Care (Signed)
Immediate Anesthesia Transfer of Care Note ? ?Patient: Denise Glass ? ?Procedure(s) Performed: ESOPHAGOGASTRODUODENOSCOPY (EGD) WITH PROPOFOL ? ?Patient Location: PACU and Endoscopy Unit ? ?Anesthesia Type:General ? ?Level of Consciousness: drowsy ? ?Airway & Oxygen Therapy: Patient Spontanous Breathing ? ?Post-op Assessment: Report given to RN ? ?Post vital signs: stable ? ?Last Vitals:  ?Vitals Value Taken Time  ?BP    ?Temp    ?Pulse    ?Resp    ?SpO2    ? ? ?Last Pain:  ?Vitals:  ? 02/21/22 0912  ?TempSrc: Temporal  ?PainSc: 0-No pain  ?   ? ?  ? ?Complications: No notable events documented. ?

## 2022-02-21 NOTE — H&P (Signed)
Outpatient short stay form Pre-procedure ?02/21/2022  ?Denise Rubenstein, MD ? ?Primary Physician: Jearld Fenton, NP ? ?Reason for visit:  GERD ? ?History of present illness:   ? ?66 y/o lady with history of GERD, obesity, depression, and HLD here for EGD for GERD symptoms. No blood thinners. No neck surgeries. No family history of GI malignancies. ? ? ? ?Current Facility-Administered Medications:  ?  0.9 %  sodium chloride infusion, , Intravenous, Continuous, Shuntavia Yerby, Hilton Cork, MD, Last Rate: 20 mL/hr at 02/21/22 0943, Continued from Pre-op at 02/21/22 0943 ? ?Medications Prior to Admission  ?Medication Sig Dispense Refill Last Dose  ? diphenhydrAMINE HCl (BENADRYL ALLERGY PO) Take by mouth.   Past Month  ? ibuprofen (ADVIL) 200 MG tablet Take by mouth.   Past Week  ? levothyroxine (SYNTHROID) 50 MCG tablet Take 50 mcg by mouth daily before breakfast.     ? omeprazole (PRILOSEC) 20 MG capsule Take by mouth.   Past Week at 2200  ? ondansetron (ZOFRAN) 4 MG tablet Take 1 tablet (4 mg total) by mouth every 8 (eight) hours as needed for nausea or vomiting. 20 tablet 0 Past Week  ? oxybutynin (DITROPAN XL) 15 MG 24 hr tablet TAKE 1 TABLET BY MOUTH EVERYDAY AT BEDTIME 90 tablet 0 02/20/2022 at 2200  ? sertraline (ZOLOFT) 50 MG tablet Take 1 tablet by mouth daily.   Past Month  ? busPIRone (BUSPAR) 5 MG tablet Take by mouth.     ? cyclobenzaprine (FLEXERIL) 10 MG tablet Take 10 mg by mouth 3 (three) times daily.     ? DULoxetine (CYMBALTA) 20 MG capsule Take 1 capsule (20 mg total) by mouth daily. 30 capsule 1   ? HYDROcodone-acetaminophen (NORCO/VICODIN) 5-325 MG tablet Take 1 tablet by mouth 2 (two) times daily.     ? meclizine (ANTIVERT) 25 MG tablet Take by mouth.     ? Na Sulfate-K Sulfate-Mg Sulf 17.5-3.13-1.6 GM/177ML SOLN Take by mouth.     ? oxybutynin (DITROPAN-XL) 10 MG 24 hr tablet Take 1 tablet by mouth daily.     ? oxyCODONE (OXY IR/ROXICODONE) 5 MG immediate release tablet      ? simvastatin (ZOCOR) 10 MG  tablet TAKE 1 TABLET BY MOUTH EVERYDAY AT BEDTIME 90 tablet 2   ? simvastatin (ZOCOR) 10 MG tablet Take by mouth.     ? traMADol (ULTRAM) 50 MG tablet Take 50 mg by mouth 2 (two) times daily.     ? traMADol (ULTRAM) 50 MG tablet      ? zolpidem (AMBIEN) 10 MG tablet Take 1 tablet (10 mg total) by mouth at bedtime as needed. for sleep 30 tablet 0   ? ? ? ?Allergies  ?Allergen Reactions  ? Prednazoline Other (See Comments)  ? Prednisone   ? Sulfa Antibiotics Other (See Comments)  ?  Unknown  ? ? ? ?Past Medical History:  ?Diagnosis Date  ? Complication of anesthesia   ? pt reports her appetite takes 2-3 weeks to come back after anesthesia.  ? Depression   ? DJD (degenerative joint disease)   ? GERD (gastroesophageal reflux disease)   ? Headache 2016  ? cluster head aches, Head aches have subsided.  ? Hypertension   ? Hypothyroidism   ? Insomnia   ? Mitral valve prolapse 2003  ? Followed by Dr. Clayborn Bigness (prn)  ? PONV (postoperative nausea and vomiting) 2002  ? With BTL  ? Urine frequency   ? ? ?Review of systems:  Otherwise  negative.  ? ? ?Physical Exam ? ?Gen: Alert, oriented. Appears stated age.  ?HEENT: PERRLA. ?Lungs: No respiratory distress ?CV: RRR ?Abd: soft, benign, no masses ?Ext: No edema ? ? ? ?Planned procedures: Proceed with EGD. The patient understands the nature of the planned procedure, indications, risks, alternatives and potential complications including but not limited to bleeding, infection, perforation, damage to internal organs and possible oversedation/side effects from anesthesia. The patient agrees and gives consent to proceed.  ?Please refer to procedure notes for findings, recommendations and patient disposition/instructions.  ? ? ? ?Denise Rubenstein, MD ?[facility] ? ? ? ?  ? ?

## 2022-02-22 ENCOUNTER — Encounter: Payer: Self-pay | Admitting: Gastroenterology

## 2022-02-22 NOTE — Anesthesia Postprocedure Evaluation (Signed)
Anesthesia Post Note ? ?Patient: Denise Glass ? ?Procedure(s) Performed: ESOPHAGOGASTRODUODENOSCOPY (EGD) WITH PROPOFOL ? ?Patient location during evaluation: PACU ?Anesthesia Type: General ?Level of consciousness: awake and alert ?Pain management: pain level controlled ?Vital Signs Assessment: post-procedure vital signs reviewed and stable ?Respiratory status: spontaneous breathing, nonlabored ventilation, respiratory function stable and patient connected to nasal cannula oxygen ?Cardiovascular status: blood pressure returned to baseline and stable ?Postop Assessment: no apparent nausea or vomiting ?Anesthetic complications: no ? ? ?No notable events documented. ? ? ?Last Vitals:  ?Vitals:  ? 02/21/22 1011 02/21/22 1021  ?BP: 122/77 135/62  ?Pulse: 79 76  ?Resp: 17 10  ?Temp:    ?SpO2: 93% 99%  ?  ?Last Pain:  ?Vitals:  ? 02/22/22 0756  ?TempSrc:   ?PainSc: 0-No pain  ? ? ?  ?  ?  ?  ?  ?  ? ?Molli Barrows ? ? ? ? ?

## 2022-03-15 DIAGNOSIS — G894 Chronic pain syndrome: Secondary | ICD-10-CM | POA: Diagnosis not present

## 2022-03-15 DIAGNOSIS — M792 Neuralgia and neuritis, unspecified: Secondary | ICD-10-CM | POA: Insufficient documentation

## 2022-03-15 DIAGNOSIS — Z79899 Other long term (current) drug therapy: Secondary | ICD-10-CM | POA: Diagnosis not present

## 2022-04-04 ENCOUNTER — Other Ambulatory Visit: Payer: Self-pay | Admitting: Internal Medicine

## 2022-04-04 DIAGNOSIS — F5104 Psychophysiologic insomnia: Secondary | ICD-10-CM

## 2022-04-05 NOTE — Telephone Encounter (Signed)
Requested medication (s) are due for refill today: yes  Requested medication (s) are on the active medication list: yes  Last refill:  08/16/21 #30 0 refills  Future visit scheduled: no  Notes to clinic:  not delegated per protocol. Do you want to refill Rx?     Requested Prescriptions  Pending Prescriptions Disp Refills   zolpidem (AMBIEN) 10 MG tablet [Pharmacy Med Name: ZOLPIDEM TARTRATE 10 MG TABLET] 30 tablet     Sig: Take 1 tablet (10 mg total) by mouth at bedtime as needed. for sleep     Not Delegated - Psychiatry:  Anxiolytics/Hypnotics Failed - 04/04/2022  5:10 PM      Failed - This refill cannot be delegated      Failed - Urine Drug Screen completed in last 360 days      Passed - Valid encounter within last 6 months    Recent Outpatient Visits           2 months ago Severe episode of recurrent major depressive disorder, without psychotic features ([place])   [facility] [place], Dani Gobble, PA-C   7 months ago Need for immunization against influenza   [facility] [place], Coralie Keens, NP   11 months ago [facility] [place], Coralie Keens, NP   1 year ago Anxiety with depression   [place], FNP   1 year ago Pharyngitis, unspecified etiology   [facility], Lupita Raider, [place]

## 2022-04-13 DIAGNOSIS — D485 Neoplasm of uncertain behavior of skin: Secondary | ICD-10-CM | POA: Diagnosis not present

## 2022-04-13 DIAGNOSIS — D1801 Hemangioma of skin and subcutaneous tissue: Secondary | ICD-10-CM | POA: Diagnosis not present

## 2022-04-13 DIAGNOSIS — L814 Other melanin hyperpigmentation: Secondary | ICD-10-CM | POA: Diagnosis not present

## 2022-05-01 ENCOUNTER — Ambulatory Visit: Payer: Medicare Other | Admitting: Surgery

## 2022-05-08 DIAGNOSIS — S76311A Strain of muscle, fascia and tendon of the posterior muscle group at thigh level, right thigh, initial encounter: Secondary | ICD-10-CM | POA: Diagnosis not present

## 2022-05-08 DIAGNOSIS — Z96651 Presence of right artificial knee joint: Secondary | ICD-10-CM | POA: Diagnosis not present

## 2022-05-08 DIAGNOSIS — Z96641 Presence of right artificial hip joint: Secondary | ICD-10-CM | POA: Diagnosis not present

## 2022-05-15 ENCOUNTER — Encounter: Payer: Self-pay | Admitting: Surgery

## 2022-05-15 ENCOUNTER — Ambulatory Visit: Payer: Medicare Other | Admitting: Surgery

## 2022-05-15 ENCOUNTER — Other Ambulatory Visit: Payer: Self-pay

## 2022-05-15 VITALS — Temp 98.5°F | Ht 62.0 in | Wt 275.0 lb

## 2022-05-15 DIAGNOSIS — K449 Diaphragmatic hernia without obstruction or gangrene: Secondary | ICD-10-CM

## 2022-05-15 NOTE — Patient Instructions (Addendum)
Please call our office if you have any questions or concerns.    Hiatal Hernia  A hiatal hernia occurs when part of the stomach slides above the muscle that separates the abdomen from the chest (diaphragm). A person can be born with a hiatal hernia (congenital), or it may develop over time. In almost all cases of hiatal hernia, only the top part of the stomach pushes through the diaphragm. Many people have a hiatal hernia with no symptoms. The larger the hernia, the more likely it is that you will have symptoms. In some cases, a hiatal hernia allows stomach acid to flow back into the tube that carries food from your mouth to your stomach (esophagus). This may cause heartburn symptoms. Severe heartburn symptoms may mean that you have developed a condition called gastroesophageal reflux disease (GERD). What are the causes? This condition is caused by a weakness in the opening (hiatus) where the esophagus passes through the diaphragm to attach to the upper part of the stomach. A person may be born with a weakness in the hiatus, or a weakness can develop over time. What increases the risk? This condition is more likely to develop in: Older people. Age is a major risk factor for a hiatal hernia, especially if you are over the age of 49. Pregnant women. People who are overweight. People who have frequent constipation. What are the signs or symptoms? Symptoms of this condition usually develop in the form of GERD symptoms. Symptoms include: Heartburn. Belching. Indigestion. Trouble swallowing. Coughing or wheezing. Sore throat. Hoarseness. Chest pain. Nausea and vomiting. How is this diagnosed? This condition may be diagnosed during testing for GERD. Tests that may be done include: X-rays of your stomach or chest. An upper gastrointestinal (GI) series. This is an X-ray exam of your GI tract that is taken after you swallow a chalky liquid that shows up clearly on the X-ray. Endoscopy. This is a  procedure to look into your stomach using a thin, flexible tube that has a tiny camera and light on the end of it. How is this treated? This condition may be treated by: Dietary and lifestyle changes to help reduce GERD symptoms. Medicines. These may include: Over-the-counter antacids. Medicines that make your stomach empty more quickly. Medicines that block the production of stomach acid (H2 blockers). Stronger medicines to reduce stomach acid (proton pump inhibitors). Surgery to repair the hernia, if other treatments are not helping. If you have no symptoms, you may not need treatment. Follow these instructions at home: Lifestyle and activity Do not use any products that contain nicotine or tobacco, such as cigarettes and e-cigarettes. If you need help quitting, ask your health care provider. Try to achieve and maintain a healthy body weight. Avoid putting pressure on your abdomen. Anything that puts pressure on your abdomen increases the amount of acid that may be pushed up into your esophagus. Avoid bending over, especially after eating. Raise the head of your bed by putting blocks under the legs. This keeps your head and esophagus higher than your stomach. Do not wear tight clothing around your chest or stomach. Try not to strain when having a bowel movement, when urinating, or when lifting heavy objects. Eating and drinking Avoid foods that can worsen GERD symptoms. These may include: Fatty foods, like fried foods. Citrus fruits, like oranges or lemon. Other foods and drinks that contain acid, like orange juice or tomatoes. Spicy food. Chocolate. Eat frequent small meals instead of three large meals a day. This helps  prevent your stomach from getting too full. Eat slowly. Do not lie down right after eating. Do not eat 1-2 hours before bed. Do not drink beverages with caffeine. These include cola, coffee, cocoa, and tea. Do not drink alcohol. General instructions Take  over-the-counter and prescription medicines only as told by your health care provider. Keep all follow-up visits as told by your health care provider. This is important. Contact a health care provider if: Your symptoms are not controlled with medicines or lifestyle changes. You are having trouble swallowing. You have coughing or wheezing that will not go away. Get help right away if: Your pain is getting worse. Your pain spreads to your arms, neck, jaw, teeth, or back. You have shortness of breath. You sweat for no reason. You feel sick to your stomach (nauseous) or you vomit. You vomit blood. You have bright red blood in your stools. You have black, tarry stools. Summary A hiatal hernia occurs when part of the stomach slides above the muscle that separates the abdomen from the chest (diaphragm). A person may be born with a weakness in the hiatus, or a weakness can develop over time. Symptoms of hiatal hernia may include heartburn, trouble swallowing, or sore throat. Management of hiatal hernia includes eating frequent small meals instead of three large meals a day. Get help right away if you vomit blood, have bright red blood in your stools, or have black, tarry stools. This information is not intended to replace advice given to you by your health care provider. Make sure you discuss any questions you have with your health care provider. Document Revised: 08/16/2021 Document Reviewed: 09/02/2020 Elsevier Patient Education  [place].

## 2022-05-19 NOTE — Progress Notes (Signed)
Patient ID: Denise Glass, female   DOB: 1956-05-12, 66 y.o.   MRN: [id number]  HPI Denise Glass is a 66 y.o. female SEEN for hiatal hernia and significant reflux.She has a history of HTN, obesity, Depression, Hypothyroidism, GERD, DJD,  Bowel movements vary in consistency from normal to constipation to loose stools twice per week.   She did have a CT scan of the abdomen pelvis that I have personally reviewed showing some reflux and very minimal sliding hiatal hernia.  GERD: She has heartburn and take OTC omeprazole x 20 years. No chest pain, dysphagia, reflux, regurgitation, epigastric pain, nausea/vomiting, or melena.  She did have an EGD more recently by Dr. Haig Prophet that I have personally reviewed showing essentially normal endoluminal anatomy She has had bilateral knee replacements and has some mobility issues. He also had a hip replacement. Prior history of tubal ligation. CBC and CMP was completely normal.  She is able to perform more than 4 METS of activity without any shortness of breath or chest pain.  HPI  Past Medical History:  Diagnosis Date   Complication of anesthesia    pt reports her appetite takes 2-3 weeks to come back after anesthesia.   Depression    DJD (degenerative joint disease)    GERD (gastroesophageal reflux disease)    Headache 2016   cluster head aches, Head aches have subsided.   Hypertension    Hypothyroidism    Insomnia    Mitral valve prolapse 2003   Followed by Dr. Clayborn Bigness (prn)   PONV (postoperative nausea and vomiting) 2002   With BTL   Urine frequency     Past Surgical History:  Procedure Laterality Date   BREAST BIOPSY  2001   Calcification - bx done by Dr. Raylene Everts   BREAST SURGERY  2001   biopsy   COLONOSCOPY WITH PROPOFOL N/A 11/13/2016   Procedure: COLONOSCOPY WITH PROPOFOL;  Surgeon: Manya Silvas, MD;  Location: [place];  Service: Endoscopy;  Laterality: N/A;   ESOPHAGOGASTRODUODENOSCOPY (EGD) WITH PROPOFOL N/A  02/21/2022   Procedure: ESOPHAGOGASTRODUODENOSCOPY (EGD) WITH PROPOFOL;  Surgeon: Lesly Rubenstein, MD;  Location: [facility];  Service: Endoscopy;  Laterality: N/A;   JOINT REPLACEMENT  2008   left and right knee replaced   KNEE ARTHROPLASTY  2007   Right Knee   KNEE ARTHROPLASTY  2008   Left Knee   left total knee replacement     POLYPECTOMY     colon   right total knee     TOTAL HIP ARTHROPLASTY Right 08/22/2017   Procedure: TOTAL HIP ARTHROPLASTY;  Surgeon: Dereck Leep, MD;  Location: [facility];  Service: Orthopedics;  Laterality: Right;   TUBAL LIGATION      Family History  Problem Relation Age of Onset   COPD Mother    Heart disease Mother    Stroke Mother    Heart disease Father    COPD Father    Cancer Father        throat   Healthy Brother    Drug abuse Brother    Healthy Son     Social History Social History   Tobacco Use   Smoking status: Never   Smokeless tobacco: Never  Vaping Use   Vaping Use: Never used  Substance Use Topics   Alcohol use: No   Drug use: No    Allergies  Allergen Reactions   Other Other (See Comments)   Prednisone Other (See Comments)    Mouth sores  and thrush    Sulfa Antibiotics Other (See Comments) and Nausea And Vomiting    Unknown Stevens-Johnson Syndrome (affecting mucous membranes)   Prednazoline Other (See Comments)    Current Outpatient Medications  Medication Sig Dispense Refill   cyclobenzaprine (FLEXERIL) 10 MG tablet Take 10 mg by mouth 3 (three) times daily.     diphenhydrAMINE HCl (BENADRYL ALLERGY PO) Take by mouth.     HYDROcodone-acetaminophen (NORCO/VICODIN) 5-325 MG tablet Take 1 tablet by mouth 2 (two) times daily.     ibuprofen (ADVIL) 200 MG tablet Take by mouth.     omeprazole (PRILOSEC) 20 MG capsule Take by mouth.     ondansetron (ZOFRAN) 4 MG tablet Take 1 tablet (4 mg total) by mouth every 8 (eight) hours as needed for nausea or vomiting. 20 tablet 0   zolpidem (AMBIEN) 10 MG tablet  Take 1 tablet (10 mg total) by mouth at bedtime as needed. for sleep 30 tablet 0   No current facility-administered medications for this visit.     Review of Systems Full ROS  was asked and was negative except for the information on the HPI  Physical Exam Temperature 98.5 F (36.9 C), temperature source Oral, height '5\' 2"'$  (1.575 m), weight 275 lb (124.7 kg), SpO2 99 %. CONSTITUTIONAL: Nad. EYES: Pupils are equal, round,  Sclera are non-icteric. EARS, NOSE, MOUTH AND THROAT: TThe oral mucosa is pink and moist. Hearing is intact to voice. LYMPH NODES:  Lymph nodes in the neck are normal. RESPIRATORY:  Lungs are clear. There is normal respiratory effort, with equal breath sounds bilaterally, and without pathologic use of accessory muscles. CARDIOVASCULAR: Heart is regular without murmurs, gallops, or rubs. GI: The abdomen is  soft, nontender, and nondistended. There are no palpable masses. There is no hepatosplenomegaly. There are normal bowel sounds in all quadrants. GU: Rectal deferred.   MUSCULOSKELETAL: Normal muscle strength and tone. No cyanosis or edema.   SKIN: Turgor is good and there are no pathologic skin lesions or ulcers. NEUROLOGIC: Motor and sensation is grossly normal. Cranial nerves are grossly intact. PSYCH:  Oriented to person, place and time. Affect is normal.  Data Reviewed  I have personally reviewed the patient's imaging, laboratory findings and medical records.    Assessment/Plan 66 year old female with recalcitrant reflux and a very small sliding hiatal hernia.  The main issue is her BMI that is at 50.  Had an extensive discussion with the patient about her disease process.  I specifically talked to her about potential hiatal hernia as well as GERD.  My main concern is her high BMI.  I would not perform any antireflux surgery or hiatal hernia repair although she has chief an optimal weight.  BMI of 35 or less will be a consideration. Discussed with her about  arranging potential evaluation by either bariatric surgeon or weight loss medical specialist. She has struggled with weight loss.  She will call us back she wants to try some diet changes on her own.  At this time no need for surgical intervention.  Discussed with her about medications to prevent reflux at lifestyle changes. she understands. I spent 60 minutes in this encounter including personally reviewing medical records, imaging studies, coordinating her care, placing orders and performing appropriate documentation    Caroleen Hamman, MD [place] [facility] 05/19/2022, 12:31 PM

## 2022-05-30 ENCOUNTER — Ambulatory Visit: Admit: 2022-05-30 | Payer: Medicare Other

## 2022-05-30 SURGERY — COLONOSCOPY WITH PROPOFOL
Anesthesia: General

## 2022-06-12 DIAGNOSIS — L570 Actinic keratosis: Secondary | ICD-10-CM | POA: Diagnosis not present

## 2022-06-14 DIAGNOSIS — Z79899 Other long term (current) drug therapy: Secondary | ICD-10-CM | POA: Diagnosis not present

## 2022-06-14 DIAGNOSIS — Z79891 Long term (current) use of opiate analgesic: Secondary | ICD-10-CM | POA: Diagnosis not present

## 2022-06-14 DIAGNOSIS — M792 Neuralgia and neuritis, unspecified: Secondary | ICD-10-CM | POA: Diagnosis not present

## 2022-06-14 DIAGNOSIS — G894 Chronic pain syndrome: Secondary | ICD-10-CM | POA: Diagnosis not present

## 2022-06-20 ENCOUNTER — Other Ambulatory Visit: Payer: Self-pay | Admitting: Physician Assistant

## 2022-06-20 DIAGNOSIS — R110 Nausea: Secondary | ICD-10-CM

## 2022-06-22 NOTE — Telephone Encounter (Signed)
Requested medication (s) are due for refill today: yes  Requested medication (s) are on the active medication list: yes  Last refill:  01/10/22 #20  Future visit scheduled: no  Notes to clinic:  med not delegated to NT to RF.    Requested Prescriptions  Pending Prescriptions Disp Refills   ondansetron (ZOFRAN) 4 MG tablet [Pharmacy Med Name: ONDANSETRON HCL 4 MG TABLET] 20 tablet 0    Sig: TAKE 1 TABLET BY MOUTH EVERY 8 HOURS AS NEEDED FOR NAUSEA AND VOMITING     Not Delegated - Gastroenterology: Antiemetics - ondansetron Failed - 06/20/2022  6:05 PM      Failed - This refill cannot be delegated      Failed - AST in normal range and within 360 days    AST  Date Value Ref Range Status  04/19/2021 22 10 - 35 U/L Final   SGOT(AST)  Date Value Ref Range Status  01/17/2015 20 U/L Final    Comment:    15-41 NOTE: New Reference Range  12/22/14          Failed - ALT in normal range and within 360 days    ALT  Date Value Ref Range Status  04/19/2021 24 6 - 29 U/L Final   SGPT (ALT)  Date Value Ref Range Status  01/17/2015 13 (L) U/L Final    Comment:    14-54 NOTE: New Reference Range  12/22/14          Passed - Valid encounter within last 6 months    Recent Outpatient Visits           5 months ago Severe episode of recurrent major depressive disorder, without psychotic features ([place])   [facility] [place], Dani Gobble, PA-C   10 months ago Need for immunization against influenza   [facility] [place], Coralie Keens, NP   1 year ago [facility] [place], Coralie Keens, NP   1 year ago Anxiety with depression   [place], FNP   1 year ago Pharyngitis, unspecified etiology   [facility], Lupita Raider, [place]

## 2022-07-04 ENCOUNTER — Telehealth: Payer: Self-pay | Admitting: *Deleted

## 2022-07-04 NOTE — Patient Outreach (Signed)
  Care Coordination   07/04/2022 Name: Denise Glass MRN: [id number] DOB: 1956/04/28   Care Coordination Outreach Attempts:  An unsuccessful telephone outreach was attempted today to offer the patient information about available care coordination services as a benefit of their health plan.   Follow Up Plan:  Additional outreach attempts will be made to offer the patient care coordination information and services.   Encounter Outcome:  No Answer  Care Coordination Interventions Activated:  Yes   Care Coordination Interventions:  No, not indicated    [place] [facility] [phone]

## 2022-07-06 ENCOUNTER — Other Ambulatory Visit: Payer: Self-pay | Admitting: Internal Medicine

## 2022-07-06 DIAGNOSIS — N3281 Overactive bladder: Secondary | ICD-10-CM

## 2022-07-06 DIAGNOSIS — R350 Frequency of micturition: Secondary | ICD-10-CM

## 2022-07-06 DIAGNOSIS — N3942 Incontinence without sensory awareness: Secondary | ICD-10-CM

## 2022-07-06 NOTE — Telephone Encounter (Signed)
Requested medication (s) are due for refill today: review  Requested medication (s) are on the active medication list: no  Last refill:  12/02/21  Future visit scheduled: no  Notes to clinic:  rx was dc'd on 05/15/22 by CMA. Please review for refill.      Requested Prescriptions  Pending Prescriptions Disp Refills   oxybutynin (DITROPAN XL) 15 MG 24 hr tablet [Pharmacy Med Name: OXYBUTYNIN CL ER 15 MG TABLET] 90 tablet 0    Sig: TAKE 1 TABLET BY MOUTH EVERYDAY AT BEDTIME     Urology:  Bladder Agents Passed - 07/06/2022  2:03 AM      Passed - Valid encounter within last 12 months    Recent Outpatient Visits           5 months ago Severe episode of recurrent major depressive disorder, without psychotic features [facility])   [facility] [place], Dani Gobble, PA-C   10 months ago Need for immunization against influenza   [facility] [place], Coralie Keens, NP   1 year ago [facility] [place], Coralie Keens, NP   1 year ago Anxiety with depression   [place], FNP   1 year ago Pharyngitis, unspecified etiology   [facility], Lupita Raider, [place]

## 2022-07-13 ENCOUNTER — Ambulatory Visit (INDEPENDENT_AMBULATORY_CARE_PROVIDER_SITE_OTHER): Payer: Medicare Other | Admitting: Internal Medicine

## 2022-07-13 ENCOUNTER — Encounter: Payer: Self-pay | Admitting: Internal Medicine

## 2022-07-13 VITALS — BP 136/86 | HR 88 | Temp 97.1°F | Wt 272.0 lb

## 2022-07-13 DIAGNOSIS — F5104 Psychophysiologic insomnia: Secondary | ICD-10-CM | POA: Diagnosis not present

## 2022-07-13 DIAGNOSIS — J00 Acute nasopharyngitis [common cold]: Secondary | ICD-10-CM | POA: Diagnosis not present

## 2022-07-13 MED ORDER — ZOLPIDEM TARTRATE 10 MG PO TABS: 10.0000 mg | ORAL_TABLET | Freq: Every evening | ORAL | 0 refills | Status: DC | PRN

## 2022-07-13 MED ORDER — AZITHROMYCIN 250 MG PO TABS: ORAL_TABLET | ORAL | 0 refills | Status: DC

## 2022-07-13 NOTE — Progress Notes (Signed)
HPI  Pt presents to the clinic today with c/o headache, runny nose, sore throat and cough. This started 2 weeks ago. Her headache is located in her temples. She describes the pain as pressure. She has some facial pressure. She is blowing clear mucous out of her nose. She denies difficulty swallowing. The cough is productive of clear mucous. She denies nasal congestion, ear pain or shortness of breath. She denies fever, chills or body aches.  She has tried Delsym and Mucinex OTC with minimal relief of symptoms. She has not had sick contacts that she is aware of. She has taken 3 home covid test which were negative.  Review of Systems      Past Medical History:  Diagnosis Date   Complication of anesthesia    pt reports her appetite takes 2-3 weeks to come back after anesthesia.   Depression    DJD (degenerative joint disease)    GERD (gastroesophageal reflux disease)    Headache 2016   cluster head aches, Head aches have subsided.   Hypertension    Hypothyroidism    Insomnia    Mitral valve prolapse 2003   Followed by Dr. Clayborn Bigness (prn)   PONV (postoperative nausea and vomiting) 2002   With BTL   Urine frequency     Family History  Problem Relation Age of Onset   COPD Mother    Heart disease Mother    Stroke Mother    Heart disease Father    COPD Father    Cancer Father        throat   Healthy Brother    Drug abuse Brother    Healthy Son     Social History   Socioeconomic History   Marital status: Widowed    Spouse name: Not on file   Number of children: Not on file   Years of education: Not on file   Highest education level: Not on file  Occupational History   Occupation: retired  Tobacco Use   Smoking status: Never   Smokeless tobacco: Never  Vaping Use   Vaping Use: Never used  Substance and Sexual Activity   Alcohol use: No   Drug use: No   Sexual activity: Not Currently  Other Topics Concern   Not on file  Social History Narrative   Patient is a 66 y/o  widow (2011) who lives independently at home. 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 She has one son, a daughter-in-law and a grandson that live locally.  She struggles with her relationships with mother and brother.   Social Determinants of Health   Financial Resource Strain: Low Risk  (02/10/2022)   Overall Financial Resource Strain (CARDIA)    Difficulty of Paying Living Expenses: Not hard at all  Food Insecurity: No Food Insecurity (02/10/2022)   Hunger Vital Sign    Worried About Running Out of Food in the Last Year: Never true    Ran Out of Food in the Last Year: Never true  Transportation Needs: No Transportation Needs (02/10/2022)   PRAPARE - Hydrologist (Medical): No    Lack of Transportation (Non-Medical): No  Physical Activity: Insufficiently Active (02/10/2022)   Exercise Vital Sign    Days of Exercise per Week: 2 days    Minutes of Exercise per Session: 20 min  Stress: No Stress Concern Present (02/10/2022)   [place]    Feeling of Stress :  Only a little  Social Connections: Moderately Isolated (02/10/2022)   Social Connection and Isolation Panel [NHANES]    Frequency of Communication with Friends and Family: More than three times a week    Frequency of Social Gatherings with Friends and Family: Once a week    Attends Religious Services: More than 4 times per year    Active Member of [employer] or Organizations: No    Attends Archivist Meetings: Never    Marital Status: Divorced  Human resources officer Violence: Not At Risk (02/10/2022)   Humiliation, Afraid, Rape, and Kick questionnaire    Fear of Current or Ex-Partner: No    Emotionally Abused: No    Physically Abused: No    Sexually Abused: No    Allergies  Allergen Reactions   Other Other (See Comments)   Prednisone Other (See Comments)    Mouth sores and thrush    Sulfa  Antibiotics Other (See Comments) and Nausea And Vomiting    Unknown Stevens-Johnson Syndrome (affecting mucous membranes)   Prednazoline Other (See Comments)     Constitutional: Positive headache. Denies fatigue, fever or abrupt weight changes.  HEENT:  Positive facial pressure, runny nose, sore throat. Denies eye redness, eye pain, pressure behind the eyes, facial pain, nasal congestion, ear pain, ringing in the ears, wax buildup, or bloody nose. Respiratory: Positive cough. Denies difficulty breathing or shortness of breath.  Cardiovascular: Denies chest pain, chest tightness, palpitations or swelling in the hands or feet.   No other specific complaints in a complete review of systems (except as listed in HPI above).  Objective:   BP 136/86 (BP Location: Left Wrist, Patient Position: Sitting, Cuff Size: Normal)   Pulse 88   Temp (!) 97.1 F (36.2 C) (Temporal)   Wt 272 lb (123.4 kg)   SpO2 95%   BMI 49.75 kg/m   Wt Readings from Last 3 Encounters:  05/15/22 275 lb (124.7 kg)  02/21/22 269 lb (122 kg)  02/10/22 278 lb (126.1 kg)     General: Appears her stated age, obese, in NAD. HEENT: Head: normal shape and size; Eyes: sclera white, no icterus, conjunctiva pink; Throat/Mouth: + PND. Teeth present, mucosa erythematous and moist, no exudate noted, no lesions or ulcerations noted.  Neck: No cervical lymphadenopathy.  Cardiovascular: Normal rate and rhythm. S1,S2 noted.  No murmur, rubs or gallops noted.  Pulmonary/Chest: Normal effort and positive vesicular breath sounds. No respiratory distress. No wheezes, rales or ronchi noted.       Assessment & Plan:   Upper Respiratory Infection:  Get some rest and drink plenty of water Do salt water gargles for the sore throat eRx for Azithromax x 5 days Continue Delsym as needed for cough  Schedule an appt for your annual exam   Webb Silversmith, NP

## 2022-07-13 NOTE — Patient Instructions (Signed)

## 2022-08-09 ENCOUNTER — Telehealth: Payer: Self-pay | Admitting: Internal Medicine

## 2022-08-09 NOTE — Telephone Encounter (Signed)
Medication Refill - Medication: oxybutynin (DITROPAN-XL) 10 MG 24 hr tablet  Has the patient contacted their pharmacy? Yes.     Preferred Pharmacy (with phone number or street name):  [id number] [address] Phone:  [phone] Fax:  [phone]    Has the patient been seen for an appointment in the last year OR does the patient have an upcoming appointment? Yes.    Please assist patient further

## 2022-08-10 MED ORDER — OXYBUTYNIN CHLORIDE ER 10 MG PO TB24
10.0000 mg | ORAL_TABLET | Freq: Every day | ORAL | 0 refills | Status: DC
Start: 2022-08-10 — End: 2022-08-17

## 2022-08-10 NOTE — Telephone Encounter (Signed)
Requested medication (s) are due for refill today:   Not sure  Requested medication (s) are on the active medication list:   Yes as historical   Future visit scheduled:   Yes in 4 days with Rollene Fare   Last ordered: 07/13/2022 from a historical provider however it looks like Rollene Fare Prescribed this.  Returned for provider to review.   Requested Prescriptions  Pending Prescriptions Disp Refills   oxybutynin (DITROPAN-XL) 10 MG 24 hr tablet      Sig: Take 1 tablet (10 mg total) by mouth at bedtime.     Urology:  Bladder Agents Passed - 08/09/2022  4:23 PM      Passed - Valid encounter within last 12 months    Recent Outpatient Visits           4 weeks ago Acute nasopharyngitis   [facility] [place], [place], NP   7 months ago Severe episode of recurrent major depressive disorder, without psychotic features [facility])   [place], PA-C   11 months ago Need for immunization against influenza   [facility] [place], Coralie Keens, NP   1 year ago [facility] [place], Coralie Keens, NP   1 year ago Anxiety with depression   [place], FNP       Future Appointments             In 4 days [place], Coralie Keens, NP [facility], [facility]

## 2022-08-11 ENCOUNTER — Encounter: Payer: Self-pay | Admitting: *Deleted

## 2022-08-11 ENCOUNTER — Telehealth: Payer: Self-pay | Admitting: *Deleted

## 2022-08-11 NOTE — Patient Outreach (Signed)
  Care Coordination   Initial Visit Note   08/11/2022 Name: Denise Glass MRN: [id number] DOB: 10-14-1956  Denise Glass is a 66 y.o. year old female who sees [place], Coralie Keens, NP for primary care. I spoke with  Denise Glass by phone today.  What matters to the patients health and wellness today?  State she is recovering from upper respiratory infection, completed Z-pack and will have follow up with PCP on 10/30.  Otherwise, report she is doing well and denies need for follow up.  Denies any urgent concerns, encouraged to contact this care manager with questions.      Goals Addressed             This Visit's Progress    COMPLETED: Care Coordination Activities - No follow up needed       Care Coordination Interventions: Patient interviewed about adult health maintenance status including  Influenza Vaccine Regular eye checkups Regular Dental Care    Blood Pressure    Advised patient to discuss  Pneumonia Vaccine COVID vaccination    with primary care provider  Provided education about overall health maintenance (diet and exercise) SDOH assessment completed        SDOH assessments and interventions completed:  Yes  SDOH Interventions Today    Flowsheet Row Most Recent Value  SDOH Interventions   Food Insecurity Interventions Intervention Not Indicated  Housing Interventions Intervention Not Indicated  Transportation Interventions Intervention Not Indicated  Utilities Interventions Intervention Not Indicated        Care Coordination Interventions Activated:  Yes  Care Coordination Interventions:  Yes, provided   Follow up plan: No further intervention required.   Encounter Outcome:  Pt. Visit Completed   Valente David, RN, MSN, [place] Care Management Care Management Coordinator [phone]

## 2022-08-11 NOTE — Patient Instructions (Signed)
Visit Information  Thank you for taking time to visit with me today. Please don't hesitate to contact me if I can be of assistance to you.  Following are the goals we discussed today:  Discuss vaccinations with provider (Covid, Pneumonia, and RSV).  Please call the Suicide and Crisis Lifeline: 988 call the [place] National Suicide Prevention Lifeline: 787-711-1956 or TTY: 214-865-2430 TTY 819-268-9318) to talk to a trained counselor call 1-800-273-TALK (toll free, 24 hour hotline) call 911 if you are experiencing a Mental Health or [place] or need someone to talk to.  Patient verbalizes understanding of instructions and care plan provided today and agrees to view in [place]. Active MyChart status and patient understanding of how to access instructions and care plan via MyChart confirmed with patient.     The patient has been provided with contact information for the care management team and has been advised to call with any health related questions or concerns.   Valente David, RN, MSN, [place] Care Management Care Management Coordinator [phone]

## 2022-08-14 ENCOUNTER — Ambulatory Visit (INDEPENDENT_AMBULATORY_CARE_PROVIDER_SITE_OTHER): Payer: Medicare Other | Admitting: Internal Medicine

## 2022-08-14 ENCOUNTER — Encounter: Payer: Self-pay | Admitting: Internal Medicine

## 2022-08-14 VITALS — BP 118/84 | HR 71 | Temp 96.8°F | Ht 62.0 in | Wt 270.0 lb

## 2022-08-14 DIAGNOSIS — E782 Mixed hyperlipidemia: Secondary | ICD-10-CM

## 2022-08-14 DIAGNOSIS — R7303 Prediabetes: Secondary | ICD-10-CM | POA: Diagnosis not present

## 2022-08-14 DIAGNOSIS — Z23 Encounter for immunization: Secondary | ICD-10-CM

## 2022-08-14 DIAGNOSIS — Z1159 Encounter for screening for other viral diseases: Secondary | ICD-10-CM

## 2022-08-14 DIAGNOSIS — Z0001 Encounter for general adult medical examination with abnormal findings: Secondary | ICD-10-CM

## 2022-08-14 MED ORDER — MECLIZINE HCL 25 MG PO TABS: 25.0000 mg | ORAL_TABLET | Freq: Three times a day (TID) | ORAL | 1 refills | Status: DC | PRN

## 2022-08-14 NOTE — Assessment & Plan Note (Signed)
Encouraged diet and exercise for weight loss ?

## 2022-08-14 NOTE — Patient Instructions (Signed)
Health Maintenance for Postmenopausal Women Menopause is a normal process in which your ability to get pregnant comes to an end. This process happens slowly over many months or years, usually between the ages of 48 and 55. Menopause is complete when you have missed your menstrual period for 12 months. It is important to talk with your health care provider about some of the most common conditions that affect women after menopause (postmenopausal women). These include heart disease, cancer, and bone loss (osteoporosis). Adopting a healthy lifestyle and getting preventive care can help to promote your health and wellness. The actions you take can also lower your chances of developing some of these common conditions. What are the signs and symptoms of menopause? During menopause, you may have the following symptoms: Hot flashes. These can be moderate or severe. Night sweats. Decrease in sex drive. Mood swings. Headaches. Tiredness (fatigue). Irritability. Memory problems. Problems falling asleep or staying asleep. Talk with your health care provider about treatment options for your symptoms. Do I need hormone replacement therapy? Hormone replacement therapy is effective in treating symptoms that are caused by menopause, such as hot flashes and night sweats. Hormone replacement carries certain risks, especially as you become older. If you are thinking about using estrogen or estrogen with progestin, discuss the benefits and risks with your health care provider. How can I reduce my risk for heart disease and stroke? The risk of heart disease, heart attack, and stroke increases as you age. One of the causes may be a change in the body's hormones during menopause. This can affect how your body uses dietary fats, triglycerides, and cholesterol. Heart attack and stroke are medical emergencies. There are many things that you can do to help prevent heart disease and stroke. Watch your blood pressure High  blood pressure causes heart disease and increases the risk of stroke. This is more likely to develop in people who have high blood pressure readings or are overweight. Have your blood pressure checked: Every 3-5 years if you are 18-39 years of age. Every year if you are 40 years old or older. Eat a healthy diet  Eat a diet that includes plenty of vegetables, fruits, low-fat dairy products, and lean protein. Do not eat a lot of foods that are high in solid fats, added sugars, or sodium. Get regular exercise Get regular exercise. This is one of the most important things you can do for your health. Most adults should: Try to exercise for at least 150 minutes each week. The exercise should increase your heart rate and make you sweat (moderate-intensity exercise). Try to do strengthening exercises at least twice each week. Do these in addition to the moderate-intensity exercise. Spend less time sitting. Even light physical activity can be beneficial. Other tips Work with your health care provider to achieve or maintain a healthy weight. Do not use any products that contain nicotine or tobacco. These products include cigarettes, chewing tobacco, and vaping devices, such as e-cigarettes. If you need help quitting, ask your health care provider. Know your numbers. Ask your health care provider to check your cholesterol and your blood sugar (glucose). Continue to have your blood tested as directed by your health care provider. Do I need screening for cancer? Depending on your health history and family history, you may need to have cancer screenings at different stages of your life. This may include screening for: Breast cancer. Cervical cancer. Lung cancer. Colorectal cancer. What is my risk for osteoporosis? After menopause, you may be   at increased risk for osteoporosis. Osteoporosis is a condition in which bone destruction happens more quickly than new bone creation. To help prevent osteoporosis or  the bone fractures that can happen because of osteoporosis, you may take the following actions: If you are 19-50 years old, get at least 1,000 mg of calcium and at least 600 international units (IU) of vitamin D per day. If you are older than age 50 but younger than age 70, get at least 1,200 mg of calcium and at least 600 international units (IU) of vitamin D per day. If you are older than age 70, get at least 1,200 mg of calcium and at least 800 international units (IU) of vitamin D per day. Smoking and drinking excessive alcohol increase the risk of osteoporosis. Eat foods that are rich in calcium and vitamin D, and do weight-bearing exercises several times each week as directed by your health care provider. How does menopause affect my mental health? Depression may occur at any age, but it is more common as you become older. Common symptoms of depression include: Feeling depressed. Changes in sleep patterns. Changes in appetite or eating patterns. Feeling an overall lack of motivation or enjoyment of activities that you previously enjoyed. Frequent crying spells. Talk with your health care provider if you think that you are experiencing any of these symptoms. General instructions See your health care provider for regular wellness exams and vaccines. This may include: Scheduling regular health, dental, and eye exams. Getting and maintaining your vaccines. These include: Influenza vaccine. Get this vaccine each year before the flu season begins. Pneumonia vaccine. Shingles vaccine. Tetanus, diphtheria, and pertussis (Tdap) booster vaccine. Your health care provider may also recommend other immunizations. Tell your health care provider if you have ever been abused or do not feel safe at home. Summary Menopause is a normal process in which your ability to get pregnant comes to an end. This condition causes hot flashes, night sweats, decreased interest in sex, mood swings, headaches, or lack  of sleep. Treatment for this condition may include hormone replacement therapy. Take actions to keep yourself healthy, including exercising regularly, eating a healthy diet, watching your weight, and checking your blood pressure and blood sugar levels. Get screened for cancer and depression. Make sure that you are up to date with all your vaccines. This information is not intended to replace advice given to you by your health care provider. Make sure you discuss any questions you have with your health care provider. Document Revised: 02/21/2021 Document Reviewed: 02/21/2021 Elsevier Patient Education  2023 [employer]  

## 2022-08-14 NOTE — Progress Notes (Signed)
Subjective:    Patient ID: Denise Glass, female    DOB: 01-20-1956, 66 y.o.   MRN: [id number]  HPI  Patient presents to clinic today for her annual exam.  Flu: 08/2021 Tetanus: [id number] COVID: Alphonsa Overall x1 Pneumovax: 06/2020 Prevnar: Never Shingrix: Never Pap smear: No longer screening Mammogram: >2 years ago Bone density: Never Colon screening: 10/19/2016 Vision screening: annually Dentist: biannually  Diet: She does eat meat. She consumes fruits and veggies.  She does eat some fried foods. She drinks mostly tea and soda. Exercise: None  Review of Systems  Past Medical History:  Diagnosis Date   Complication of anesthesia    pt reports her appetite takes 2-3 weeks to come back after anesthesia.   Depression    DJD (degenerative joint disease)    GERD (gastroesophageal reflux disease)    Headache 2016   cluster head aches, Head aches have subsided.   Hypertension    Hypothyroidism    Insomnia    Mitral valve prolapse 2003   Followed by Dr. Clayborn Bigness (prn)   PONV (postoperative nausea and vomiting) 2002   With BTL   Urine frequency     Current Outpatient Medications  Medication Sig Dispense Refill   azithromycin (ZITHROMAX) 250 MG tablet Take 2 tabs today, then 1 tab daily x 4 days 6 tablet 0   cyclobenzaprine (FLEXERIL) 10 MG tablet Take 10 mg by mouth 3 (three) times daily.     diphenhydrAMINE HCl (BENADRYL ALLERGY PO) Take by mouth.     HYDROcodone-acetaminophen (NORCO/VICODIN) 5-325 MG tablet Take 1 tablet by mouth 2 (two) times daily.     ibuprofen (ADVIL) 200 MG tablet Take by mouth.     omeprazole (PRILOSEC) 20 MG capsule Take by mouth.     ondansetron (ZOFRAN) 4 MG tablet Take 1 tablet (4 mg total) by mouth every 8 (eight) hours as needed for nausea or vomiting. 20 tablet 0   oxybutynin (DITROPAN-XL) 10 MG 24 hr tablet Take 1 tablet (10 mg total) by mouth at bedtime. 90 tablet 0   zolpidem (AMBIEN) 10 MG tablet Take 1 tablet (10 mg total) by mouth at bedtime  as needed. for sleep 30 tablet 0   No current facility-administered medications for this visit.    Allergies  Allergen Reactions   Other Other (See Comments)   Prednisone Other (See Comments)    Mouth sores and thrush    Sulfa Antibiotics Other (See Comments) and Nausea And Vomiting    Unknown Stevens-Johnson Syndrome (affecting mucous membranes)   Prednazoline Other (See Comments)    Family History  Problem Relation Age of Onset   COPD Mother    Heart disease Mother    Stroke Mother    Heart disease Father    COPD Father    Cancer Father        throat   Healthy Brother    Drug abuse Brother    Healthy Son     Social History   Socioeconomic History   Marital status: Widowed    Spouse name: Not on file   Number of children: Not on file   Years of education: Not on file   Highest education level: Not on file  Occupational History   Occupation: retired  Tobacco Use   Smoking status: Never   Smokeless tobacco: Never  Vaping Use   Vaping Use: Never used  Substance and Sexual Activity   Alcohol use: No   Drug use: No   Sexual activity: Not  Currently  Other Topics Concern   Not on file  Social History Narrative   Patient is a 66 y/o widow (2011) who lives independently at home. 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 She has one son, a daughter-in-law and a grandson that live locally.  She struggles with her relationships with mother and brother.   Social Determinants of Health   Financial Resource Strain: Low Risk  (02/10/2022)   Overall Financial Resource Strain (CARDIA)    Difficulty of Paying Living Expenses: Not hard at all  Food Insecurity: No Food Insecurity (08/11/2022)   Hunger Vital Sign    Worried About Running Out of Food in the Last Year: Never true    Ran Out of Food in the Last Year: Never true  Transportation Needs: No Transportation Needs (08/11/2022)   PRAPARE - Hydrologist  (Medical): No    Lack of Transportation (Non-Medical): No  Physical Activity: Insufficiently Active (02/10/2022)   Exercise Vital Sign    Days of Exercise per Week: 2 days    Minutes of Exercise per Session: 20 min  Stress: No Stress Concern Present (02/10/2022)   [place]    Feeling of Stress : Only a little  Social Connections: Moderately Isolated (02/10/2022)   Social Connection and Isolation Panel [NHANES]    Frequency of Communication with Friends and Family: More than three times a week    Frequency of Social Gatherings with Friends and Family: Once a week    Attends Religious Services: More than 4 times per year    Active Member of [employer] or Organizations: No    Attends Archivist Meetings: Never    Marital Status: Divorced  Human resources officer Violence: Not At Risk (02/10/2022)   Humiliation, Afraid, Rape, and Kick questionnaire    Fear of Current or Ex-Partner: No    Emotionally Abused: No    Physically Abused: No    Sexually Abused: No     Constitutional: Denies fever, malaise, fatigue, headache or abrupt weight changes.  HEENT: Denies eye pain, eye redness, ear pain, ringing in the ears, wax buildup, runny nose, nasal congestion, bloody nose, or sore throat. Respiratory: Denies difficulty breathing, shortness of breath, cough or sputum production.   Cardiovascular: Denies chest pain, chest tightness, palpitations or swelling in the hands or feet.  Gastrointestinal: Denies abdominal pain, bloating, constipation, diarrhea or blood in the stool.  GU: Denies urgency, frequency, pain with urination, burning sensation, blood in urine, odor or discharge. Musculoskeletal: Denies decrease in range of motion, difficulty with gait, muscle pain or joint pain and swelling.  Skin: Denies redness, rashes, lesions or ulcercations.  Neurological: Patient reports neuropathic pain, and insomnia.  Denies dizziness,  difficulty with memory, difficulty with speech or problems with balance and coordination.  Psych: Patient has a history of anxiety and depression.  Denies SI/HI.  No other specific complaints in a complete review of systems (except as listed in HPI above).     Objective:   Physical Exam  BP 118/84 (BP Location: Left Wrist, Patient Position: Sitting, Cuff Size: Normal)   Pulse 71   Temp (!) 96.8 F (36 C) (Temporal)   Ht _0  (1.575 m)   Wt 270 lb (122.5 kg)   SpO2 98%   BMI 49.38 kg/m   Wt Readings from Last 3 Encounters:  07/13/22 272 lb (123.4 kg)  05/15/22 275 lb (124.7 kg)  02/21/22 269 lb (122 kg)    General: Appears her stated age, obese, in NAD. Skin: Warm, dry and intact.  HEENT: Head: normal shape and size; Eyes: sclera white, no icterus, conjunctiva pink, PERRLA and EOMs intact;  Neck:  Neck supple, trachea midline. No masses, lumps or thyromegaly present.  Cardiovascular: Normal rate and rhythm. S1,S2 noted.  No murmur, rubs or gallops noted. No JVD or BLE edema. No carotid bruits noted. Pulmonary/Chest: Normal effort and positive vesicular breath sounds. No respiratory distress. No wheezes, rales or ronchi noted.  Abdomen:  Normal bowel sounds Musculoskeletal: Strength 5/5 BUE/BLE. No difficulty with gait.  Neurological: Alert and oriented. Cranial nerves II-XII grossly intact. Coordination normal.  Psychiatric: Mood and affect normal. Behavior is normal. Judgment and thought content normal.    BMET    Component Value Date/Time   NA 138 04/19/2021 1507   NA 138 11/14/2019 1451   NA 140 01/17/2015 1431   K 4.1 04/19/2021 1507   K 4.5 01/17/2015 1431   CL 103 04/19/2021 1507   CL 104 01/17/2015 1431   CO2 24 04/19/2021 1507   CO2 29 01/17/2015 1431   GLUCOSE 127 (H) 04/19/2021 1507   GLUCOSE 107 (H) 01/17/2015 1431   BUN 10 04/19/2021 1507   BUN 13 11/14/2019 1451   BUN 13 01/17/2015 1431   CREATININE 0.90 12/22/2021 1601   CREATININE 0.76 04/19/2021  1507   CALCIUM 9.7 04/19/2021 1507   CALCIUM 10.2 01/17/2015 1431   GFRNONAA >60 03/19/2020 0022   GFRNONAA >60 01/17/2015 1431   GFRAA >60 03/19/2020 0022   GFRAA >60 01/17/2015 1431    Lipid Panel     Component Value Date/Time   CHOL 224 (H) 04/19/2021 1507   TRIG 160 (H) 04/19/2021 1507   HDL 66 04/19/2021 1507   CHOLHDL 3.4 04/19/2021 1507   VLDL 31 (H) 05/31/2017 0837   LDLCALC 130 (H) 04/19/2021 1507    CBC    Component Value Date/Time   WBC 10.5 03/19/2020 0022   RBC 4.96 03/19/2020 0022   HGB 14.4 03/19/2020 0022   HGB 15.3 11/14/2019 1451   HCT 43.8 03/19/2020 0022   HCT 44.6 11/14/2019 1451   PLT 369 03/19/2020 0022   PLT 343 11/14/2019 1451   MCV 88.3 03/19/2020 0022   MCV 88 11/14/2019 1451   MCV 89 01/17/2015 1431   MCH 29.0 03/19/2020 0022   MCHC 32.9 03/19/2020 0022   RDW 13.9 03/19/2020 0022   RDW 13.3 11/14/2019 1451   RDW 13.5 01/17/2015 1431   LYMPHSABS 1.9 11/14/2019 1451   MONOABS 528 05/31/2017 0837   EOSABS 0.2 11/14/2019 1451   BASOSABS 0.0 11/14/2019 1451    Hgb A1C Lab Results  Component Value Date   HGBA1C 5.9 (H) 04/19/2021           Assessment & Plan:   Preventative Health Maintenance:  Flu shot today She declines tetanus for financial reasons, advised if she gets bit or cut to go get this done Pneumovax UTD Prevnar 20 today Discussed Shingrix vaccine, she will check coverage with her insurance company and schedule a visit at the pharmacy if she would like to have this done Encouraged her to get her COVID booster She no longer wants to screen for cervical cancer She declines mammogram and bone density Colon screening UTD Urged her to consume a balanced diet and exercise regimen Advised her to see an eye doctor and dentist annually We will check CBC, c-Met, lipid, A1c  and hep C today  RTC in 6 months, follow-up chronic conditions Webb Silversmith, NP

## 2022-08-15 LAB — CBC
HCT: 44.8 % (ref 35.0–45.0)
Hemoglobin: 15.3 g/dL (ref 11.7–15.5)
MCH: 30.8 pg (ref 27.0–33.0)
MCHC: 34.2 g/dL (ref 32.0–36.0)
MCV: 90.1 fL (ref 80.0–100.0)
MPV: 10.5 fL (ref 7.5–12.5)
Platelets: 336 10*3/uL (ref 140–400)
RBC: 4.97 10*6/uL (ref 3.80–5.10)
RDW: 13.2 % (ref 11.0–15.0)
WBC: 9.3 10*3/uL (ref 3.8–10.8)

## 2022-08-15 LAB — TEST AUTHORIZATION: TEST CODE:: 7600

## 2022-08-15 LAB — COMPLETE METABOLIC PANEL WITH GFR
AG Ratio: 1.6 (calc) (ref 1.0–2.5)
ALT: 16 U/L (ref 6–29)
AST: 17 U/L (ref 10–35)
Albumin: 4.3 g/dL (ref 3.6–5.1)
Alkaline phosphatase (APISO): 94 U/L (ref 37–153)
BUN: 9 mg/dL (ref 7–25)
CO2: 22 mmol/L (ref 20–32)
Calcium: 9.3 mg/dL (ref 8.6–10.4)
Chloride: 104 mmol/L (ref 98–110)
Creat: 0.81 mg/dL (ref 0.50–1.05)
Globulin: 2.7 g/dL (calc) (ref 1.9–3.7)
Glucose, Bld: 111 mg/dL (ref 65–139)
Potassium: 4.1 mmol/L (ref 3.5–5.3)
Sodium: 138 mmol/L (ref 135–146)
Total Bilirubin: 0.5 mg/dL (ref 0.2–1.2)
Total Protein: 7 g/dL (ref 6.1–8.1)
eGFR: 80 mL/min/{1.73_m2} (ref >=60)

## 2022-08-15 LAB — LIPID PANEL
Cholesterol: 218 mg/dL — ABNORMAL HIGH (ref <200)
HDL: 59 mg/dL (ref >=50)
LDL Cholesterol (Calc): 129 mg/dL (calc) — ABNORMAL HIGH
Non-HDL Cholesterol (Calc): 159 mg/dL (calc) — ABNORMAL HIGH (ref <130)
Total CHOL/HDL Ratio: 3.7 (calc) (ref <5.0)
Triglycerides: 179 mg/dL — ABNORMAL HIGH (ref <150)

## 2022-08-15 LAB — HEMOGLOBIN A1C
Hgb A1c MFr Bld: 6 % of total Hgb — ABNORMAL HIGH (ref <5.7)
Mean Plasma Glucose: 126 mg/dL
eAG (mmol/L): 7 mmol/L

## 2022-08-15 LAB — HEPATITIS C ANTIBODY: Hepatitis C Ab: NONREACTIVE

## 2022-08-15 MED ORDER — ATORVASTATIN CALCIUM 10 MG PO TABS: 10.0000 mg | ORAL_TABLET | Freq: Every day | ORAL | 1 refills | Status: DC

## 2022-08-15 NOTE — Addendum Note (Signed)
Addended by: Jearld Fenton on: 08/15/2022 11:02 AM   Modules accepted: Orders

## 2022-08-16 NOTE — Telephone Encounter (Signed)
Pt called to report that the correct dose is 15 MG. She would like a new order called in for her because she says that she does not take 10 MG. Please advise

## 2022-08-17 MED ORDER — OXYBUTYNIN CHLORIDE ER 15 MG PO TB24: 15.0000 mg | ORAL_TABLET | Freq: Every day | ORAL | 1 refills | Status: DC

## 2022-08-17 NOTE — Telephone Encounter (Signed)
Ditropan 15 mg XL sent to pharmacy

## 2022-08-17 NOTE — Addendum Note (Signed)
Addended by: Jearld Fenton on: 08/17/2022 08:57 AM   Modules accepted: Orders

## 2022-08-24 DIAGNOSIS — G894 Chronic pain syndrome: Secondary | ICD-10-CM | POA: Insufficient documentation

## 2022-08-30 DIAGNOSIS — Z79899 Other long term (current) drug therapy: Secondary | ICD-10-CM | POA: Diagnosis not present

## 2022-08-30 DIAGNOSIS — G894 Chronic pain syndrome: Secondary | ICD-10-CM | POA: Diagnosis not present

## 2022-08-30 DIAGNOSIS — M792 Neuralgia and neuritis, unspecified: Secondary | ICD-10-CM | POA: Diagnosis not present

## 2022-11-13 ENCOUNTER — Other Ambulatory Visit: Payer: Self-pay

## 2022-11-13 DIAGNOSIS — E782 Mixed hyperlipidemia: Secondary | ICD-10-CM

## 2022-11-14 ENCOUNTER — Other Ambulatory Visit: Payer: Medicare Other

## 2022-11-14 ENCOUNTER — Other Ambulatory Visit: Payer: Self-pay | Admitting: Internal Medicine

## 2022-11-14 DIAGNOSIS — F5104 Psychophysiologic insomnia: Secondary | ICD-10-CM

## 2022-11-14 DIAGNOSIS — E782 Mixed hyperlipidemia: Secondary | ICD-10-CM | POA: Diagnosis not present

## 2022-11-14 NOTE — Telephone Encounter (Signed)
PT IS REQUESTING A REFILL ON ZOLPIDEM, MECLIZINE  CALLED INTO   [facility] IN   [place]

## 2022-11-15 LAB — COMPLETE METABOLIC PANEL WITH GFR
AG Ratio: 1.5 (calc) (ref 1.0–2.5)
ALT: 15 U/L (ref 6–29)
AST: 19 U/L (ref 10–35)
Albumin: 4.2 g/dL (ref 3.6–5.1)
Alkaline phosphatase (APISO): 103 U/L (ref 37–153)
BUN: 10 mg/dL (ref 7–25)
CO2: 24 mmol/L (ref 20–32)
Calcium: 9.3 mg/dL (ref 8.6–10.4)
Chloride: 103 mmol/L (ref 98–110)
Creat: 0.9 mg/dL (ref 0.50–1.05)
Globulin: 2.8 g/dL (calc) (ref 1.9–3.7)
Glucose, Bld: 120 mg/dL — ABNORMAL HIGH (ref 65–99)
Potassium: 3.9 mmol/L (ref 3.5–5.3)
Sodium: 140 mmol/L (ref 135–146)
Total Bilirubin: 0.4 mg/dL (ref 0.2–1.2)
Total Protein: 7 g/dL (ref 6.1–8.1)
eGFR: 70 mL/min/{1.73_m2} (ref >=60)

## 2022-11-15 LAB — LIPID PANEL
Cholesterol: 161 mg/dL (ref <200)
HDL: 62 mg/dL (ref >=50)
LDL Cholesterol (Calc): 75 mg/dL (calc)
Non-HDL Cholesterol (Calc): 99 mg/dL (calc) (ref <130)
Total CHOL/HDL Ratio: 2.6 (calc) (ref <5.0)
Triglycerides: 166 mg/dL — ABNORMAL HIGH (ref <150)

## 2022-11-15 MED ORDER — ZOLPIDEM TARTRATE 10 MG PO TABS: 10.0000 mg | ORAL_TABLET | Freq: Every evening | ORAL | 0 refills | Status: DC | PRN

## 2022-11-15 MED ORDER — MECLIZINE HCL 25 MG PO TABS: 25.0000 mg | ORAL_TABLET | Freq: Three times a day (TID) | ORAL | 1 refills | Status: DC | PRN

## 2022-11-30 DIAGNOSIS — Z79899 Other long term (current) drug therapy: Secondary | ICD-10-CM | POA: Diagnosis not present

## 2022-11-30 DIAGNOSIS — G894 Chronic pain syndrome: Secondary | ICD-10-CM | POA: Diagnosis not present

## 2022-11-30 DIAGNOSIS — M792 Neuralgia and neuritis, unspecified: Secondary | ICD-10-CM | POA: Diagnosis not present

## 2023-01-04 ENCOUNTER — Ambulatory Visit (INDEPENDENT_AMBULATORY_CARE_PROVIDER_SITE_OTHER): Payer: Medicare Other | Admitting: Internal Medicine

## 2023-01-04 ENCOUNTER — Encounter: Payer: Self-pay | Admitting: Internal Medicine

## 2023-01-04 VITALS — BP 120/78 | HR 84 | Ht 62.0 in | Wt 265.4 lb

## 2023-01-04 DIAGNOSIS — R1012 Left upper quadrant pain: Secondary | ICD-10-CM | POA: Diagnosis not present

## 2023-01-04 DIAGNOSIS — R102 Pelvic and perineal pain: Secondary | ICD-10-CM | POA: Diagnosis not present

## 2023-01-04 DIAGNOSIS — R1031 Right lower quadrant pain: Secondary | ICD-10-CM

## 2023-01-04 DIAGNOSIS — R1032 Left lower quadrant pain: Secondary | ICD-10-CM

## 2023-01-04 DIAGNOSIS — R197 Diarrhea, unspecified: Secondary | ICD-10-CM

## 2023-01-04 LAB — POCT URINALYSIS DIPSTICK
Bilirubin, UA: NEGATIVE
Glucose, UA: NEGATIVE
Ketones, UA: NEGATIVE
Nitrite, UA: NEGATIVE
Protein, UA: NEGATIVE
Spec Grav, UA: >=1.03 — AB (ref 1.010–1.025)
Urobilinogen, UA: 0.2 E.U./dL
pH, UA: 5 (ref 5.0–8.0)

## 2023-01-04 NOTE — Patient Instructions (Signed)
Diarrhea, Adult Diarrhea is when you pass loose and sometimes watery poop (stool) often. Diarrhea can make you feel weak and cause you to lose water in your body (get dehydrated). Losing water in your body can cause you to: Feel tired and thirsty. Have a dry mouth. Go pee (urinate) less often. Diarrhea often lasts 2-3 days. It can last longer if it is a sign of something more serious. Be sure to treat your diarrhea as told by your doctor. Follow these instructions at home: Eating and drinking     Follow these instructions as told by your doctor: Take an ORS (oral rehydration solution). This is a drink that helps you replace fluids and minerals your body lost. It is sold at pharmacies and stores. Drink enough fluid to keep your pee (urine) pale yellow. Drink fluids such as: Water. You can also get fluids by sucking on ice chips. Diluted fruit juice. Low-calorie sports drinks. Milk. Avoid drinking fluids that have a lot of sugar or caffeine in them. These include soda, energy drinks, and regular sports drinks. Avoid alcohol. Eat bland, easy-to-digest foods in small amounts as you are able. These foods include: Bananas. Applesauce. Rice. Low-fat (lean) meats. Toast. Crackers. Avoid spicy or fatty foods.  Medicines Take over-the-counter and prescription medicines only as told by your doctor. If you were prescribed antibiotics, take them as told by your doctor. Do not stop taking them even if you start to feel better. General instructions  Wash your hands often using soap and water for 20 seconds. If soap and water are not available, use hand sanitizer. Others in your home should wash their hands as well. Wash your hands: After using the toilet or changing a diaper. Before preparing, cooking, or serving food. While caring for a sick person. While visiting someone in a hospital. Rest at home while you get better. Take a warm bath to help with any burning or pain from having  diarrhea. Watch your condition for any changes. Contact a doctor if: You have a fever. Your diarrhea gets worse. You have new symptoms. You vomit every time you eat or drink. You feel light-headed, dizzy, or you have a headache. You have muscle cramps. You have signs of losing too much water in your body, such as: Dark pee, very little pee, or no pee. Cracked lips. Dry mouth. Sunken eyes. Sleepiness. Weakness. You have bloody or black poop or poop that looks like tar. You have very bad pain, cramping, or bloating in your belly (abdomen). Your skin feels cold and clammy. You feel confused. Get help right away if: You have chest pain. Your heart is beating very quickly. You have trouble breathing or you are breathing very quickly. You feel very weak or you faint. These symptoms may be an emergency. Get help right away. Call 911. Do not wait to see if the symptoms will go away. Do not drive yourself to the hospital. This information is not intended to replace advice given to you by your health care provider. Make sure you discuss any questions you have with your health care provider. Document Revised: 03/21/2022 Document Reviewed: 03/21/2022 Elsevier Patient Education  2023 Elsevier Inc.  

## 2023-01-04 NOTE — Progress Notes (Signed)
Subjective:    Patient ID: Denise Glass, female    DOB: 01/15/1956, 67 y.o.   MRN: [id number]  HPI  Patient presents to clinic today with complaint of pelvic pain. She reports this started 1 week ago. The pain is intermittent. She describes the pain as achy. The pain only occurs at night. It occasionally hurts when she laughs or takes a deep breath, but not with movement. She denies heartburn or reflux. She denies low back pain. She denies urgency, frequency, dysuria or blood in her urine. She denies vaginal discharge, irritation, odor or abnormal vaginal bleeding. She has not had a hysterectomy. She reports she has had diarrhea with this. She has had 2-4 stools per day. She denies change in color, odor or blood in her stool. She denies fever, chills, nausea or vomiting. She denies recently changes in diet or medications.  There was no evidence of diverticulosis on her colonoscopy in 2018. She has taken Hydrocodone with some relief of symptoms.   Review of Systems     Past Medical History:  Diagnosis Date   Complication of anesthesia    pt reports her appetite takes 2-3 weeks to come back after anesthesia.   Depression    DJD (degenerative joint disease)    GERD (gastroesophageal reflux disease)    Headache 2016   cluster head aches, Head aches have subsided.   Hypertension    Hypothyroidism    Insomnia    Mitral valve prolapse 2003   Followed by Dr. Clayborn Bigness (prn)   PONV (postoperative nausea and vomiting) 2002   With BTL   Urine frequency     Current Outpatient Medications  Medication Sig Dispense Refill   oxybutynin (DITROPAN XL) 15 MG 24 hr tablet Take 1 tablet (15 mg total) by mouth at bedtime. 90 tablet 1   atorvastatin (LIPITOR) 10 MG tablet Take 1 tablet (10 mg total) by mouth daily. 90 tablet 1   cyclobenzaprine (FLEXERIL) 10 MG tablet Take 10 mg by mouth 3 (three) times daily.     diphenhydrAMINE HCl (BENADRYL ALLERGY PO) Take by mouth.     HYDROcodone-acetaminophen  (NORCO/VICODIN) 5-325 MG tablet Take 1 tablet by mouth 2 (two) times daily.     ibuprofen (ADVIL) 200 MG tablet Take by mouth.     meclizine (ANTIVERT) 25 MG tablet Take 1 tablet (25 mg total) by mouth 3 (three) times daily as needed for dizziness. 30 tablet 1   omeprazole (PRILOSEC) 20 MG capsule Take by mouth.     zolpidem (AMBIEN) 10 MG tablet Take 1 tablet (10 mg total) by mouth at bedtime as needed. for sleep 30 tablet 0   No current facility-administered medications for this visit.    Allergies  Allergen Reactions   Other Other (See Comments)   Prednisone Other (See Comments)    Mouth sores and thrush    Sulfa Antibiotics Other (See Comments) and Nausea And Vomiting    Unknown Stevens-Johnson Syndrome (affecting mucous membranes)   Prednazoline Other (See Comments)    Family History  Problem Relation Age of Onset   COPD Mother    Heart disease Mother    Stroke Mother    Heart disease Father    COPD Father    Cancer Father        throat   Healthy Brother    Drug abuse Brother    Healthy Son     Social History   Socioeconomic History   Marital status: Widowed  Spouse name: Not on file   Number of children: Not on file   Years of education: Not on file   Highest education level: Not on file  Occupational History   Occupation: retired  Tobacco Use   Smoking status: Never   Smokeless tobacco: Never  Vaping Use   Vaping Use: Never used  Substance and Sexual Activity   Alcohol use: No   Drug use: No   Sexual activity: Not Currently  Other Topics Concern   Not on file  Social History Narrative   Patient is a 67 y/o widow (2011) who lives independently at home. 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 She has one son, a daughter-in-law and a grandson that live locally.  She struggles with her relationships with mother and brother.   Social Determinants of Health   Financial Resource Strain: Low Risk  (02/10/2022)    Overall Financial Resource Strain (CARDIA)    Difficulty of Paying Living Expenses: Not hard at all  Food Insecurity: No Food Insecurity (08/11/2022)   Hunger Vital Sign    Worried About Running Out of Food in the Last Year: Never true    Ran Out of Food in the Last Year: Never true  Transportation Needs: No Transportation Needs (08/11/2022)   PRAPARE - Hydrologist (Medical): No    Lack of Transportation (Non-Medical): No  Physical Activity: Insufficiently Active (02/10/2022)   Exercise Vital Sign    Days of Exercise per Week: 2 days    Minutes of Exercise per Session: 20 min  Stress: No Stress Concern Present (02/10/2022)   [place]    Feeling of Stress : Only a little  Social Connections: Moderately Isolated (02/10/2022)   Social Connection and Isolation Panel [NHANES]    Frequency of Communication with Friends and Family: More than three times a week    Frequency of Social Gatherings with Friends and Family: Once a week    Attends Religious Services: More than 4 times per year    Active Member of [employer] or Organizations: No    Attends Archivist Meetings: Never    Marital Status: Divorced  Human resources officer Violence: Not At Risk (02/10/2022)   Humiliation, Afraid, Rape, and Kick questionnaire    Fear of Current or Ex-Partner: No    Emotionally Abused: No    Physically Abused: No    Sexually Abused: No     Constitutional: Denies fever, malaise, fatigue, headache or abrupt weight changes.  Respiratory: Denies difficulty breathing, shortness of breath, cough or sputum production.   Cardiovascular: Denies chest pain, chest tightness, palpitations or swelling in the hands or feet.  Gastrointestinal: Patient reports diarrhea and pelvic pain.  Denies bloating, constipation, or blood in the stool.  GU: Denies urgency, frequency, pain with urination, burning sensation, blood in urine,  odor or discharge. Musculoskeletal: Denies decrease in range of motion, difficulty with gait, muscle pain or joint pain and swelling.    No other specific complaints in a complete review of systems (except as listed in HPI above).  Objective:   Physical Exam   BP 120/78 (BP Location: Left Arm, Patient Position: Sitting)   Pulse 84   Ht 5\' 2"  (1.575 m)   Wt 265 lb 6.4 oz (120.4 kg)   SpO2 97%   BMI 48.54 kg/m   Wt Readings from Last 3 Encounters:  08/14/22 270 lb (122.5 kg)  07/13/22  272 lb (123.4 kg)  05/15/22 275 lb (124.7 kg)    General: Appears her stated age, obese, in NAD.  Cardiovascular: Normal rate and rhythm. S1,S2 noted.  No murmur, rubs or gallops noted.  Pulmonary/Chest: Normal effort and positive vesicular breath sounds. No respiratory distress. No wheezes, rales or ronchi noted.  Abdomen: Soft and tender with light palpation in the LUQ, LLQ, suprapubic and RLQ abdominal pain. Normal bowel sounds. No distention or masses noted. Liver, spleen and kidneys non palpable. Musculoskeletal:  No difficulty with gait.  Neurological: Alert and oriented. Coordination normal.      BMET    Component Value Date/Time   NA 140 11/14/2022 1315   NA 138 11/14/2019 1451   NA 140 01/17/2015 1431   K 3.9 11/14/2022 1315   K 4.5 01/17/2015 1431   CL 103 11/14/2022 1315   CL 104 01/17/2015 1431   CO2 24 11/14/2022 1315   CO2 29 01/17/2015 1431   GLUCOSE 120 (H) 11/14/2022 1315   GLUCOSE 107 (H) 01/17/2015 1431   BUN 10 11/14/2022 1315   BUN 13 11/14/2019 1451   BUN 13 01/17/2015 1431   CREATININE 0.90 11/14/2022 1315   CALCIUM 9.3 11/14/2022 1315   CALCIUM 10.2 01/17/2015 1431   GFRNONAA >60 03/19/2020 0022   GFRNONAA >60 01/17/2015 1431   GFRAA >60 03/19/2020 0022   GFRAA >60 01/17/2015 1431    Lipid Panel     Component Value Date/Time   CHOL 161 11/14/2022 1315   TRIG 166 (H) 11/14/2022 1315   HDL 62 11/14/2022 1315   CHOLHDL 2.6 11/14/2022 1315   VLDL 31  (H) 05/31/2017 0837   LDLCALC 75 11/14/2022 1315    CBC    Component Value Date/Time   WBC 9.3 08/14/2022 1454   RBC 4.97 08/14/2022 1454   HGB 15.3 08/14/2022 1454   HGB 15.3 11/14/2019 1451   HCT 44.8 08/14/2022 1454   HCT 44.6 11/14/2019 1451   PLT 336 08/14/2022 1454   PLT 343 11/14/2019 1451   MCV 90.1 08/14/2022 1454   MCV 88 11/14/2019 1451   MCV 89 01/17/2015 1431   MCH 30.8 08/14/2022 1454   MCHC 34.2 08/14/2022 1454   RDW 13.2 08/14/2022 1454   RDW 13.3 11/14/2019 1451   RDW 13.5 01/17/2015 1431   LYMPHSABS 1.9 11/14/2019 1451   MONOABS 528 05/31/2017 0837   EOSABS 0.2 11/14/2019 1451   BASOSABS 0.0 11/14/2019 1451    Hgb A1C Lab Results  Component Value Date   HGBA1C 6.0 (H) 08/14/2022           Assessment & Plan:  LUQ Abdominal Pain, LLQ Abdominal Pain, Suprapubic Pain, RLQ Abdominal Pain, Diarrhea:  Urinalysis small leuks, small blood- foul odor Will send urine culture We will check CBC, c-Met, amylase, lipase and H. pylori stool antigen Consider CT abdomen/pelvis pending labs Would recommend Tylenol in place of Hydrocodone as needed for pain  RTC in 6 weeks for follow-up of chronic conditions Webb Silversmith, NP

## 2023-01-05 ENCOUNTER — Telehealth: Payer: Self-pay | Admitting: Internal Medicine

## 2023-01-05 LAB — COMPLETE METABOLIC PANEL WITH GFR
AG Ratio: 1.6 (calc) (ref 1.0–2.5)
ALT: 14 U/L (ref 6–29)
AST: 18 U/L (ref 10–35)
Albumin: 4.5 g/dL (ref 3.6–5.1)
Alkaline phosphatase (APISO): 114 U/L (ref 37–153)
BUN: 9 mg/dL (ref 7–25)
CO2: 25 mmol/L (ref 20–32)
Calcium: 9.7 mg/dL (ref 8.6–10.4)
Chloride: 103 mmol/L (ref 98–110)
Creat: 0.89 mg/dL (ref 0.50–1.05)
Globulin: 2.9 g/dL (calc) (ref 1.9–3.7)
Glucose, Bld: 114 mg/dL — ABNORMAL HIGH (ref 65–99)
Potassium: 3.9 mmol/L (ref 3.5–5.3)
Sodium: 140 mmol/L (ref 135–146)
Total Bilirubin: 0.6 mg/dL (ref 0.2–1.2)
Total Protein: 7.4 g/dL (ref 6.1–8.1)
eGFR: 71 mL/min/{1.73_m2} (ref >=60)

## 2023-01-05 LAB — CBC
HCT: 45.6 % — ABNORMAL HIGH (ref 35.0–45.0)
Hemoglobin: 15.2 g/dL (ref 11.7–15.5)
MCH: 29.7 pg (ref 27.0–33.0)
MCHC: 33.3 g/dL (ref 32.0–36.0)
MCV: 89.2 fL (ref 80.0–100.0)
MPV: 10.7 fL (ref 7.5–12.5)
Platelets: 332 10*3/uL (ref 140–400)
RBC: 5.11 10*6/uL — ABNORMAL HIGH (ref 3.80–5.10)
RDW: 13 % (ref 11.0–15.0)
WBC: 9.6 10*3/uL (ref 3.8–10.8)

## 2023-01-05 LAB — LIPASE: Lipase: 8 U/L (ref 7–60)

## 2023-01-05 LAB — AMYLASE: Amylase: 21 U/L (ref 21–101)

## 2023-01-05 NOTE — Addendum Note (Signed)
Addended by: Jearld Fenton on: 01/05/2023 11:31 AM   Modules accepted: Orders

## 2023-01-05 NOTE — Telephone Encounter (Signed)
Pt. Wanted to review lab again, verbalizes understanding.

## 2023-01-06 LAB — URINE CULTURE
MICRO NUMBER:: 14728932
Result:: NO GROWTH
SPECIMEN QUALITY:: ADEQUATE

## 2023-01-08 DIAGNOSIS — R1012 Left upper quadrant pain: Secondary | ICD-10-CM | POA: Diagnosis not present

## 2023-01-08 DIAGNOSIS — R1032 Left lower quadrant pain: Secondary | ICD-10-CM | POA: Diagnosis not present

## 2023-01-09 LAB — HELICOBACTER PYLORI  SPECIAL ANTIGEN
MICRO NUMBER:: 14738789
SPECIMEN QUALITY: ADEQUATE

## 2023-01-11 ENCOUNTER — Ambulatory Visit
Admission: RE | Admit: 2023-01-11 | Discharge: 2023-01-11 | Disposition: A | Discharge status: Home / Self Care | Payer: Medicare Other | Source: Ambulatory Visit | Attending: Internal Medicine | Admitting: Internal Medicine

## 2023-01-11 DIAGNOSIS — R1032 Left lower quadrant pain: Secondary | ICD-10-CM | POA: Insufficient documentation

## 2023-01-11 DIAGNOSIS — R1031 Right lower quadrant pain: Secondary | ICD-10-CM | POA: Diagnosis not present

## 2023-01-11 DIAGNOSIS — R102 Pelvic and perineal pain: Secondary | ICD-10-CM | POA: Diagnosis not present

## 2023-01-11 DIAGNOSIS — I700 Atherosclerosis of aorta: Secondary | ICD-10-CM | POA: Diagnosis not present

## 2023-01-11 DIAGNOSIS — R109 Unspecified abdominal pain: Secondary | ICD-10-CM | POA: Diagnosis not present

## 2023-01-11 DIAGNOSIS — R1012 Left upper quadrant pain: Secondary | ICD-10-CM | POA: Insufficient documentation

## 2023-01-11 DIAGNOSIS — R197 Diarrhea, unspecified: Secondary | ICD-10-CM

## 2023-01-11 MED ORDER — IOHEXOL 300 MG/ML  SOLN
100.0000 mL | Freq: Once | INTRAMUSCULAR | Status: AC | PRN
  Administered 2023-01-11: 100 mL via INTRAVENOUS

## 2023-01-22 ENCOUNTER — Telehealth: Payer: Self-pay | Admitting: Internal Medicine

## 2023-01-22 DIAGNOSIS — R1032 Left lower quadrant pain: Secondary | ICD-10-CM

## 2023-01-22 DIAGNOSIS — R1031 Right lower quadrant pain: Secondary | ICD-10-CM

## 2023-01-22 DIAGNOSIS — R1012 Left upper quadrant pain: Secondary | ICD-10-CM

## 2023-01-22 DIAGNOSIS — R197 Diarrhea, unspecified: Secondary | ICD-10-CM

## 2023-01-22 NOTE — Telephone Encounter (Signed)
Patient called in wants to know what she should do going forward from the results of her labs.

## 2023-01-23 NOTE — Telephone Encounter (Signed)
Per the result note on 3/28 of the CT abdomen/pelvis, she was not having any pain and would call us back if her symptoms recurred.  Have her symptoms reoccurred?

## 2023-01-24 NOTE — Addendum Note (Signed)
Addended by: Lorre Munroe on: 01/24/2023 10:51 AM   Modules accepted: Orders

## 2023-01-24 NOTE — Telephone Encounter (Signed)
Referral to GI placed

## 2023-01-24 NOTE — Telephone Encounter (Signed)
Ms. Virgilio says the pain comes and goes now.  She would like a referral to GI for a possible colonoscopy.  Thank you,   -Vernona Rieger

## 2023-02-14 DIAGNOSIS — M792 Neuralgia and neuritis, unspecified: Secondary | ICD-10-CM | POA: Diagnosis not present

## 2023-02-14 DIAGNOSIS — G894 Chronic pain syndrome: Secondary | ICD-10-CM | POA: Diagnosis not present

## 2023-02-14 DIAGNOSIS — Z79899 Other long term (current) drug therapy: Secondary | ICD-10-CM | POA: Diagnosis not present

## 2023-02-16 ENCOUNTER — Ambulatory Visit (INDEPENDENT_AMBULATORY_CARE_PROVIDER_SITE_OTHER): Payer: Medicare Other

## 2023-02-16 VITALS — Ht 62.0 in | Wt 265.0 lb

## 2023-02-16 DIAGNOSIS — Z0000 Encounter for general adult medical examination without abnormal findings: Secondary | ICD-10-CM

## 2023-02-16 NOTE — Patient Instructions (Signed)
[name] , Thank you for taking time to come for your Medicare Wellness Visit. I appreciate your ongoing commitment to your health goals. Please review the following plan we discussed and let me know if I can assist you in the future.   These are the goals we discussed:  Goals      DIET - EAT MORE FRUITS AND VEGETABLES     DIET - INCREASE WATER INTAKE        This is a list of the screening recommended for you and due dates:  Health Maintenance  Topic Date Due   Zoster (Shingles) Vaccine (1 of 2) Never done   Mammogram  07/04/2017   DEXA scan (bone density measurement)  Never done   COVID-19 Vaccine (2 - 2023-24 season) 06/16/2022   DTaP/Tdap/Td vaccine (2 - Td or Tdap) 09/23/2022   Flu Shot  05/17/2023   Medicare Annual Wellness Visit  02/16/2024   Colon Cancer Screening  11/13/2026   Pneumonia Vaccine  Completed   Hepatitis C Screening: USPSTF Recommendation to screen - Ages 18-79 yo.  Completed   HPV Vaccine  Aged Out    Advanced directives: no  Conditions/risks identified: none  Next appointment: Follow up in one year for your annual wellness visit 02/22/24 @ 10:15 am by phone   Preventive Care 65 Years and Older, Female Preventive care refers to lifestyle choices and visits with your health care provider that can promote health and wellness. What does preventive care include? A yearly physical exam. This is also called an annual well check. Dental exams once or twice a year. Routine eye exams. Ask your health care provider how often you should have your eyes checked. Personal lifestyle choices, including: Daily care of your teeth and gums. Regular physical activity. Eating a healthy diet. Avoiding tobacco and drug use. Limiting alcohol use. Practicing safe sex. Taking low-dose aspirin every day. Taking vitamin and mineral supplements as recommended by your health care provider. What happens during an annual well check? The services and screenings done by your  health care provider during your annual well check will depend on your age, overall health, lifestyle risk factors, and family history of disease. Counseling  Your health care provider may ask you questions about your: Alcohol use. Tobacco use. Drug use. Emotional well-being. Home and relationship well-being. Sexual activity. Eating habits. History of falls. Memory and ability to understand (cognition). Work and work Astronomer. Reproductive health. Screening  You may have the following tests or measurements: Height, weight, and BMI. Blood pressure. Lipid and cholesterol levels. These may be checked every 5 years, or more frequently if you are over 13 years old. Skin check. Lung cancer screening. You may have this screening every year starting at age 67 if you have a 30-pack-year history of smoking and currently smoke or have quit within the past 15 years. Fecal occult blood test (FOBT) of the stool. You may have this test every year starting at age 67. Flexible sigmoidoscopy or colonoscopy. You may have a sigmoidoscopy every 5 years or a colonoscopy every 10 years starting at age 20. Hepatitis C blood test. Hepatitis B blood test. Sexually transmitted disease (STD) testing. Diabetes screening. This is done by checking your blood sugar (glucose) after you have not eaten for a while (fasting). You may have this done every 1-3 years. Bone density scan. This is done to screen for osteoporosis. You may have this done starting at age 20. Mammogram. This may be done every 1-2 years. Talk  to your health care provider about how often you should have regular mammograms. Talk with your health care provider about your test results, treatment options, and if necessary, the need for more tests. Vaccines  Your health care provider may recommend certain vaccines, such as: Influenza vaccine. This is recommended every year. Tetanus, diphtheria, and acellular pertussis (Tdap, Td) vaccine. You may  need a Td booster every 10 years. Zoster vaccine. You may need this after age 67. Pneumococcal 13-valent conjugate (PCV13) vaccine. One dose is recommended after age 67. Pneumococcal polysaccharide (PPSV23) vaccine. One dose is recommended after age 67. Talk to your health care provider about which screenings and vaccines you need and how often you need them. This information is not intended to replace advice given to you by your health care provider. Make sure you discuss any questions you have with your health care provider. Document Released: 10/29/2015 Document Revised: 06/21/2016 Document Reviewed: 08/03/2015 Elsevier Interactive Patient Education  2017 [place] Prevention in the Home Falls can cause injuries. They can happen to people of all ages. There are many things you can do to make your home safe and to help prevent falls. What can I do on the outside of my home? Regularly fix the edges of walkways and driveways and fix any cracks. Remove anything that might make you trip as you walk through a door, such as a raised step or threshold. Trim any bushes or trees on the path to your home. Use bright outdoor lighting. Clear any walking paths of anything that might make someone trip, such as rocks or tools. Regularly check to see if handrails are loose or broken. Make sure that both sides of any steps have handrails. Any raised decks and porches should have guardrails on the edges. Have any leaves, snow, or ice cleared regularly. Use sand or salt on walking paths during winter. Clean up any spills in your garage right away. This includes oil or grease spills. What can I do in the bathroom? Use night lights. Install grab bars by the toilet and in the tub and shower. Do not use towel bars as grab bars. Use non-skid mats or decals in the tub or shower. If you need to sit down in the shower, use a plastic, non-slip stool. Keep the floor dry. Clean up any water that spills on  the floor as soon as it happens. Remove soap buildup in the tub or shower regularly. Attach bath mats securely with double-sided non-slip rug tape. Do not have throw rugs and other things on the floor that can make you trip. What can I do in the bedroom? Use night lights. Make sure that you have a light by your bed that is easy to reach. Do not use any sheets or blankets that are too big for your bed. They should not hang down onto the floor. Have a firm chair that has side arms. You can use this for support while you get dressed. Do not have throw rugs and other things on the floor that can make you trip. What can I do in the kitchen? Clean up any spills right away. Avoid walking on wet floors. Keep items that you use a lot in easy-to-reach places. If you need to reach something above you, use a strong step stool that has a grab bar. Keep electrical cords out of the way. Do not use floor polish or wax that makes floors slippery. If you must use wax, use non-skid floor wax. Do  not have throw rugs and other things on the floor that can make you trip. What can I do with my stairs? Do not leave any items on the stairs. Make sure that there are handrails on both sides of the stairs and use them. Fix handrails that are broken or loose. Make sure that handrails are as long as the stairways. Check any carpeting to make sure that it is firmly attached to the stairs. Fix any carpet that is loose or worn. Avoid having throw rugs at the top or bottom of the stairs. If you do have throw rugs, attach them to the floor with carpet tape. Make sure that you have a light switch at the top of the stairs and the bottom of the stairs. If you do not have them, ask someone to add them for you. What else can I do to help prevent falls? Wear shoes that: Do not have high heels. Have rubber bottoms. Are comfortable and fit you well. Are closed at the toe. Do not wear sandals. If you use a stepladder: Make sure  that it is fully opened. Do not climb a closed stepladder. Make sure that both sides of the stepladder are locked into place. Ask someone to hold it for you, if possible. Clearly mark and make sure that you can see: Any grab bars or handrails. First and last steps. Where the edge of each step is. Use tools that help you move around (mobility aids) if they are needed. These include: Canes. Walkers. Scooters. Crutches. Turn on the lights when you go into a dark area. Replace any light bulbs as soon as they burn out. Set up your furniture so you have a clear path. Avoid moving your furniture around. If any of your floors are uneven, fix them. If there are any pets around you, be aware of where they are. Review your medicines with your doctor. Some medicines can make you feel dizzy. This can increase your chance of falling. Ask your doctor what other things that you can do to help prevent falls. This information is not intended to replace advice given to you by your health care provider. Make sure you discuss any questions you have with your health care provider. Document Released: 07/29/2009 Document Revised: 03/09/2016 Document Reviewed: 11/06/2014 Elsevier Interactive Patient Education  2017 [employer].

## 2023-02-16 NOTE — Progress Notes (Signed)
I connected with  Denise Glass on 02/16/23 by a audio enabled telemedicine application and verified that I am speaking with the correct person using two identifiers.  Patient Location: Home  Provider Location: Office/Clinic  I discussed the limitations of evaluation and management by telemedicine. The patient expressed understanding and agreed to proceed.  Subjective:   Denise Glass is a 67 y.o. female who presents for Medicare Annual (Subsequent) preventive examination.  Review of Systems     Cardiac Risk Factors include: advanced age (>27men, >21 women);hypertension     Objective:    Today's Vitals   02/16/23 1103  PainSc: 0-No pain   There is no height or weight on file to calculate BMI.     02/16/2023   11:07 AM 02/21/2022    9:03 AM 02/10/2022    1:58 PM 10/17/2020    4:46 PM 03/19/2020   12:20 AM 07/01/2019    4:25 PM 08/22/2017    6:13 AM  Advanced Directives  Does Patient Have a Medical Advance Directive? No Yes No No No No No  Type of Energy manager of Healthcare Power of Attorney in Chart?  No - copy requested       Would patient like information on creating a medical advance directive? No - Patient declined No - Patient declined No - Patient declined   No - Patient declined No - Patient declined    Current Medications (verified) Outpatient Encounter Medications as of 02/16/2023  Medication Sig   atorvastatin (LIPITOR) 10 MG tablet Take 1 tablet (10 mg total) by mouth daily.   diphenhydrAMINE HCl (BENADRYL ALLERGY PO) Take by mouth.   HYDROcodone-acetaminophen (NORCO/VICODIN) 5-325 MG tablet Take 1 tablet by mouth 2 (two) times daily.   ibuprofen (ADVIL) 200 MG tablet Take by mouth.   meclizine (ANTIVERT) 25 MG tablet Take 1 tablet (25 mg total) by mouth 3 (three) times daily as needed for dizziness.   omeprazole (PRILOSEC) 20 MG capsule Take by mouth.   oxybutynin (DITROPAN XL) 15 MG 24 hr tablet Take 1 tablet (15 mg  total) by mouth at bedtime.   zolpidem (AMBIEN) 10 MG tablet Take 1 tablet (10 mg total) by mouth at bedtime as needed. for sleep   [DISCONTINUED] cyclobenzaprine (FLEXERIL) 10 MG tablet Take 10 mg by mouth 3 (three) times daily. (Patient not taking: Reported on 01/04/2023)   No facility-administered encounter medications on file as of 02/16/2023.    Allergies (verified) Other, Prednisone, Sulfa antibiotics, and Prednazoline   History: Past Medical History:  Diagnosis Date   Complication of anesthesia    pt reports her appetite takes 2-3 weeks to come back after anesthesia.   Depression    DJD (degenerative joint disease)    GERD (gastroesophageal reflux disease)    Headache 2016   cluster head aches, Head aches have subsided.   Hypothyroidism    Insomnia    Mitral valve prolapse 2003   Followed by Dr. Juliann Pares (prn)   PONV (postoperative nausea and vomiting) 2002   With BTL   Urine frequency    Past Surgical History:  Procedure Laterality Date   BREAST BIOPSY  2001   Calcification - bx done by Dr. Cathlean Marseilles   BREAST SURGERY  2001   biopsy   COLONOSCOPY WITH PROPOFOL N/A 11/13/2016   Procedure: COLONOSCOPY WITH PROPOFOL;  Surgeon: Scot Jun, MD;  Location: [facility] ENDOSCOPY;  Service: Endoscopy;  Laterality: N/A;  ESOPHAGOGASTRODUODENOSCOPY (EGD) WITH PROPOFOL N/A 02/21/2022   Procedure: ESOPHAGOGASTRODUODENOSCOPY (EGD) WITH PROPOFOL;  Surgeon: Regis Bill, MD;  Location: [facility] ENDOSCOPY;  Service: Endoscopy;  Laterality: N/A;   JOINT REPLACEMENT  2008   left and right knee replaced   KNEE ARTHROPLASTY  2007   Right Knee   KNEE ARTHROPLASTY  2008   Left Knee   left total knee replacement     POLYPECTOMY     colon   right total knee     TOTAL HIP ARTHROPLASTY Right 08/22/2017   Procedure: TOTAL HIP ARTHROPLASTY;  Surgeon: Donato Heinz, MD;  Location: [facility] ORS;  Service: Orthopedics;  Laterality: Right;   TUBAL LIGATION     Family History  Problem  Relation Age of Onset   COPD Mother    Heart disease Mother    Stroke Mother    Heart disease Father    COPD Father    Cancer Father        throat   Healthy Brother    Drug abuse Brother    Healthy Son    Social History   Socioeconomic History   Marital status: Widowed    Spouse name: Not on file   Number of children: Not on file   Years of education: Not on file   Highest education level: Not on file  Occupational History   Occupation: retired  Tobacco Use   Smoking status: Never   Smokeless tobacco: Never  Vaping Use   Vaping Use: Never used  Substance and Sexual Activity   Alcohol use: No   Drug use: No   Sexual activity: Not Currently  Other Topics Concern   Not on file  Social History Narrative   Patient is a 67 y/o widow (2011) who lives independently at home. 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 She has one son, a daughter-in-law and a grandson that live locally.  She struggles with her relationships with mother and brother.   Social Determinants of Health   Financial Resource Strain: Low Risk  (02/16/2023)   Overall Financial Resource Strain (CARDIA)    Difficulty of Paying Living Expenses: Not hard at all  Food Insecurity: No Food Insecurity (02/16/2023)   Hunger Vital Sign    Worried About Running Out of Food in the Last Year: Never true    Ran Out of Food in the Last Year: Never true  Transportation Needs: No Transportation Needs (02/16/2023)   PRAPARE - Administrator, Civil Service (Medical): No    Lack of Transportation (Non-Medical): No  Physical Activity: Inactive (02/16/2023)   Exercise Vital Sign    Days of Exercise per Week: 0 days    Minutes of Exercise per Session: 0 min  Stress: No Stress Concern Present (02/16/2023)   [employer] of Occupational Health - Occupational Stress Questionnaire    Feeling of Stress : Not at all  Social Connections: Moderately Isolated (02/16/2023)   Social Connection  and Isolation Panel [NHANES]    Frequency of Communication with Friends and Family: Twice a week    Frequency of Social Gatherings with Friends and Family: Once a week    Attends Religious Services: More than 4 times per year    Active Member of [employer] or Organizations: No    Attends Banker Meetings: Never    Marital Status: Divorced    Tobacco Counseling Counseling given: Not Answered   Clinical Intake:  Pre-visit preparation completed: Yes  Pain : No/denies pain Pain Score: 0-No pain     Nutritional Risks: None Diabetes: No  How often do you need to have someone help you when you read instructions, pamphlets, or other written materials from your doctor or pharmacy?: 1 - Never  Diabetic?no  Interpreter Needed?: No  Information entered by :: Kennedy Bucker, LPN   Activities of Daily Living    02/16/2023   11:08 AM 01/04/2023    3:11 PM  In your present state of health, do you have any difficulty performing the following activities:  Hearing? 0 0  Vision? 1 1  Difficulty concentrating or making decisions? 0 0  Walking or climbing stairs? 1 1  Comment knees   Dressing or bathing? 0 0  Doing errands, shopping? 0 0  Preparing Food and eating ? N   Using the Toilet? N   In the past six months, have you accidently leaked urine? N   Do you have problems with loss of bowel control? N   Managing your Medications? N   Managing your Finances? N   Housekeeping or managing your Housekeeping? N     Patient Care Team: Lorre Munroe, NP as PCP - General (Internal Medicine) Antonieta Iba, MD as PCP - Cardiology (Cardiology) Kemper Durie, RN as [facility] Care Management  Indicate any recent Medical Services you may have received from other than [facility] providers in the past year (date may be approximate).     Assessment:   This is a routine wellness examination for Elowen.  Hearing/Vision screen Hearing Screening - Comments:: No  aids Vision Screening - Comments:: Wears glasses- [facility]   Dietary issues and exercise activities discussed: Current Exercise Habits: The patient does not participate in regular exercise at present   Goals Addressed             This Visit's Progress    DIET - INCREASE WATER INTAKE         Depression Screen    02/16/2023   11:05 AM 01/04/2023    3:10 PM 08/14/2022    2:49 PM 07/13/2022    9:44 AM 02/10/2022    1:56 PM 01/10/2022    2:01 PM 04/19/2021    2:48 PM  PHQ 2/9 Scores  PHQ - 2 Score 1 3 5 6 2 6 6   PHQ- 9 Score 1 11 19 16 8 20 17     Fall Risk    02/16/2023   11:07 AM 01/04/2023    3:11 PM 07/13/2022    9:44 AM 05/15/2022    2:22 PM 02/10/2022    1:59 PM  Fall Risk   Falls in the past year? 0 1 1 0 0  Number falls in past yr:  0 0  0  Injury with Fall?  1 1  0  Risk for fall due to :  Orthopedic patient History of fall(s)  No Fall Risks  Follow up Falls evaluation completed;Falls prevention discussed  Falls evaluation completed  Falls evaluation completed    FALL RISK PREVENTION PERTAINING TO THE HOME:  Any stairs in or around the home? No  If so, are there any without handrails? No  Home free of loose throw rugs in walkways, pet beds, electrical cords, etc? Yes  Adequate lighting in your home to reduce risk of falls? Yes   ASSISTIVE DEVICES UTILIZED TO PREVENT FALLS:  Life alert? No  Use of a cane, walker or w/c? No  Grab bars in the bathroom? Yes  Shower chair or bench in shower? No  Elevated toilet seat or a handicapped toilet? No    Cognitive Function:    03/05/2020    3:00 PM  MMSE - Mini Mental State Exam  Orientation to time 5  Orientation to Place 5  Registration 3  Attention/ Calculation 5  Recall 2  Language- name 2 objects 2  Language- repeat 1  Language- follow 3 step command 3  Language- read & follow direction 1  Write a sentence 1  Copy design 1  Total score 29        02/16/2023   11:11 AM  6CIT Screen  What Year? 0  points  What month? 0 points  What time? 0 points  Count back from 20 0 points  Months in reverse 0 points  Repeat phrase 0 points  Total Score 0 points    Immunizations Immunization History  Administered Date(s) Administered   Fluad Quad(high Dose 65+) 08/16/2021, 08/14/2022   Influenza,inj,Quad PF,6+ Mos 08/25/2019, 07/07/2020   Janssen (J&J) SARS-COV-2 Vaccination 08/16/2020   PNEUMOCOCCAL CONJUGATE-20 08/14/2022   Pneumococcal Polysaccharide-23 07/07/2020   Tdap 09/23/2012    TDAP status: Due, Education has been provided regarding the importance of this vaccine. Advised may receive this vaccine at local pharmacy or Health Dept. Aware to provide a copy of the vaccination record if obtained from local pharmacy or Health Dept. Verbalized acceptance and understanding.  Flu Vaccine status: Up to date  Pneumococcal vaccine status: Up to date  Covid-19 vaccine status: Completed vaccines  Qualifies for Shingles Vaccine? Yes   Zostavax completed No   Shingrix Completed?: No.    Education has been provided regarding the importance of this vaccine. Patient has been advised to call insurance company to determine out of pocket expense if they have not yet received this vaccine. Advised may also receive vaccine at local pharmacy or Health Dept. Verbalized acceptance and understanding.  Screening Tests Health Maintenance  Topic Date Due   Zoster Vaccines- Shingrix (1 of 2) Never done   MAMMOGRAM  07/04/2017   DEXA SCAN  Never done   COVID-19 Vaccine (2 - 2023-24 season) 06/16/2022   DTaP/Tdap/Td (2 - Td or Tdap) 09/23/2022   INFLUENZA VACCINE  05/17/2023   Medicare Annual Wellness (AWV)  02/16/2024   COLONOSCOPY (Pts 45-30yrs Insurance coverage will need to be confirmed)  11/13/2026   Pneumonia Vaccine 83+ Years old  Completed   Hepatitis C Screening  Completed   HPV VACCINES  Aged Out    Health Maintenance  Health Maintenance Due  Topic Date Due   Zoster Vaccines- Shingrix  (1 of 2) Never done   MAMMOGRAM  07/04/2017   DEXA SCAN  Never done   COVID-19 Vaccine (2 - 2023-24 season) 06/16/2022   DTaP/Tdap/Td (2 - Td or Tdap) 09/23/2022    Colorectal cancer screening: Type of screening: Colonoscopy. Completed 11/13/16. Repeat every 10 years  Declined referral for mammogram and BDS    Lung Cancer Screening: (Low Dose CT Chest recommended if Age 18-80 years, 30 pack-year currently smoking OR have quit w/in 15years.) does not qualify.   Additional Screening:  Hepatitis C Screening: does qualify; Completed 08/14/22  Vision Screening: Recommended annual ophthalmology exams for early detection of glaucoma and other disorders of the eye. Is the patient up to date with their annual eye exam?  Yes  Who is the provider or what is the name of the office in which the patient attends annual eye exams? [facility]  If  pt is not established with a provider, would they like to be referred to a provider to establish care? No .   Dental Screening: Recommended annual dental exams for proper oral hygiene  Community Resource Referral / Chronic Care Management: CRR required this visit?  No   CCM required this visit?  No      Plan:     I have personally reviewed and noted the following in the patient's chart:   Medical and social history Use of alcohol, tobacco or illicit drugs  Current medications and supplements including opioid prescriptions. Patient is currently taking opioid prescriptions. Information provided to patient regarding non-opioid alternatives. Patient advised to discuss non-opioid treatment plan with their provider. Functional ability and status Nutritional status Physical activity Advanced directives List of other physicians Hospitalizations, surgeries, and ER visits in previous 12 months Vitals Screenings to include cognitive, depression, and falls Referrals and appointments  In addition, I have reviewed and discussed with patient certain  preventive protocols, quality metrics, and best practice recommendations. A written personalized care plan for preventive services as well as general preventive health recommendations were provided to patient.     Hal Hope, LPN   10/21/1094   Nurse Notes: none

## 2023-02-22 ENCOUNTER — Ambulatory Visit: Payer: Medicare Other | Admitting: Internal Medicine

## 2023-03-30 DIAGNOSIS — L570 Actinic keratosis: Secondary | ICD-10-CM | POA: Diagnosis not present

## 2023-04-17 DIAGNOSIS — X32XXXA Exposure to sunlight, initial encounter: Secondary | ICD-10-CM | POA: Diagnosis not present

## 2023-04-17 DIAGNOSIS — L570 Actinic keratosis: Secondary | ICD-10-CM | POA: Diagnosis not present

## 2023-04-18 ENCOUNTER — Other Ambulatory Visit: Payer: Self-pay | Admitting: Internal Medicine

## 2023-04-18 NOTE — Telephone Encounter (Signed)
Requested Prescriptions  Pending Prescriptions Disp Refills   oxybutynin (DITROPAN XL) 15 MG 24 hr tablet [Pharmacy Med Name: OXYBUTYNIN CL ER 15 MG TABLET] 90 tablet 1    Sig: TAKE 1 TABLET BY MOUTH EVERYDAY AT BEDTIME     Urology:  Bladder Agents Passed - 04/18/2023  2:26 AM      Passed - Valid encounter within last 12 months    Recent Outpatient Visits           3 months ago LUQ abdominal pain   [place] [facility] [place], Salvadore [place], NP   8 months ago Encounter for general adult medical examination with abnormal findings   [place] [facility] [place], Salvadore [place], NP   9 months ago Acute nasopharyngitis   [facility] [place], [place] W, NP   1 year ago Severe episode of recurrent major depressive disorder, without psychotic features [facility])   [place] [facility] [place], Oswaldo Conroy, PA-C   1 year ago Need for immunization against influenza   [facility] [place], Salvadore [place], [place]

## 2023-04-22 ENCOUNTER — Other Ambulatory Visit: Payer: Self-pay | Admitting: Internal Medicine

## 2023-04-23 NOTE — Telephone Encounter (Signed)
Requested Prescriptions  Pending Prescriptions Disp Refills   atorvastatin (LIPITOR) 10 MG tablet [Pharmacy Med Name: ATORVASTATIN 10 MG TABLET] 90 tablet 1    Sig: TAKE 1 TABLET BY MOUTH EVERY DAY     Cardiovascular:  Antilipid - Statins Failed - 04/22/2023  8:42 AM      Failed - Lipid Panel in normal range within the last 12 months    Cholesterol  Date Value Ref Range Status  11/14/2022 161 <200 mg/dL Final   LDL Cholesterol (Calc)  Date Value Ref Range Status  11/14/2022 75 mg/dL (calc) Final    Comment:    Reference range: <100 . Desirable range <100 mg/dL for primary prevention;   <70 mg/dL for patients with CHD or diabetic patients  with > or = 2 CHD risk factors. Marland Kitchen LDL-C is now calculated using the Martin-Hopkins  calculation, which is a validated novel method providing  better accuracy than the Friedewald equation in the  estimation of LDL-C.  Horald Pollen et al. Lenox Ahr. 1610;960(45): 2061-2068  (http://education.[facility].com/faq/FAQ164)    HDL  Date Value Ref Range Status  11/14/2022 62 > OR = 50 mg/dL Final   Triglycerides  Date Value Ref Range Status  11/14/2022 166 (H) <150 mg/dL Final         Passed - Patient is not pregnant      Passed - Valid encounter within last 12 months    Recent Outpatient Visits           3 months ago LUQ abdominal pain   [place] [facility] [place], Salvadore [place], NP   8 months ago Encounter for general adult medical examination with abnormal findings   [place] [facility] [place], Salvadore [place], NP   9 months ago Acute nasopharyngitis   [place] [facility] [place], [place], NP   1 year ago Severe episode of recurrent major depressive disorder, without psychotic features [facility])   [place] [facility], Oswaldo Conroy, PA-C   1 year ago Need for immunization against influenza   [facility] [place], Salvad[place], [place]

## 2023-05-07 DIAGNOSIS — D485 Neoplasm of uncertain behavior of skin: Secondary | ICD-10-CM | POA: Diagnosis not present

## 2023-05-07 DIAGNOSIS — C44319 Basal cell carcinoma of skin of other parts of face: Secondary | ICD-10-CM | POA: Diagnosis not present

## 2023-05-16 DIAGNOSIS — L720 Epidermal cyst: Secondary | ICD-10-CM | POA: Diagnosis not present

## 2023-05-16 DIAGNOSIS — X32XXXA Exposure to sunlight, initial encounter: Secondary | ICD-10-CM | POA: Diagnosis not present

## 2023-05-16 DIAGNOSIS — L570 Actinic keratosis: Secondary | ICD-10-CM | POA: Diagnosis not present

## 2023-05-17 DIAGNOSIS — G894 Chronic pain syndrome: Secondary | ICD-10-CM | POA: Diagnosis not present

## 2023-06-05 DIAGNOSIS — L570 Actinic keratosis: Secondary | ICD-10-CM | POA: Diagnosis not present

## 2023-06-05 DIAGNOSIS — L218 Other seborrheic dermatitis: Secondary | ICD-10-CM | POA: Diagnosis not present

## 2023-06-05 DIAGNOSIS — D485 Neoplasm of uncertain behavior of skin: Secondary | ICD-10-CM | POA: Diagnosis not present

## 2023-07-04 DIAGNOSIS — L578 Other skin changes due to chronic exposure to nonionizing radiation: Secondary | ICD-10-CM | POA: Diagnosis not present

## 2023-07-04 DIAGNOSIS — L988 Other specified disorders of the skin and subcutaneous tissue: Secondary | ICD-10-CM | POA: Diagnosis not present

## 2023-07-04 DIAGNOSIS — L814 Other melanin hyperpigmentation: Secondary | ICD-10-CM | POA: Diagnosis not present

## 2023-07-04 DIAGNOSIS — C44319 Basal cell carcinoma of skin of other parts of face: Secondary | ICD-10-CM | POA: Diagnosis not present

## 2023-07-18 DIAGNOSIS — D485 Neoplasm of uncertain behavior of skin: Secondary | ICD-10-CM | POA: Diagnosis not present

## 2023-07-18 DIAGNOSIS — L910 Hypertrophic scar: Secondary | ICD-10-CM | POA: Diagnosis not present

## 2023-07-18 DIAGNOSIS — L820 Inflamed seborrheic keratosis: Secondary | ICD-10-CM | POA: Diagnosis not present

## 2023-08-23 DIAGNOSIS — G894 Chronic pain syndrome: Secondary | ICD-10-CM | POA: Diagnosis not present

## 2023-08-23 DIAGNOSIS — M792 Neuralgia and neuritis, unspecified: Secondary | ICD-10-CM | POA: Diagnosis not present

## 2023-08-23 DIAGNOSIS — Z79899 Other long term (current) drug therapy: Secondary | ICD-10-CM | POA: Diagnosis not present

## 2023-09-25 DIAGNOSIS — Z08 Encounter for follow-up examination after completed treatment for malignant neoplasm: Secondary | ICD-10-CM | POA: Diagnosis not present

## 2023-09-25 DIAGNOSIS — L570 Actinic keratosis: Secondary | ICD-10-CM | POA: Diagnosis not present

## 2023-09-25 DIAGNOSIS — S0081XA Abrasion of other part of head, initial encounter: Secondary | ICD-10-CM | POA: Diagnosis not present

## 2023-09-25 DIAGNOSIS — L578 Other skin changes due to chronic exposure to nonionizing radiation: Secondary | ICD-10-CM | POA: Diagnosis not present

## 2023-10-02 ENCOUNTER — Telehealth: Payer: Self-pay

## 2023-10-02 NOTE — Telephone Encounter (Signed)
Copied from CRM [id number]. Topic: General - Other >> Oct 02, 2023  3:34 PM Dondra Prader E wrote: Reason for CRM: Pt called inquiring about lab work that tests for colon cancer, she says she is not talking about a colonoscopy or cologuard. She had polyps removed. Wants to know if PCP is aware of this lab testing  Best contact: [phone]

## 2023-10-03 NOTE — Telephone Encounter (Signed)
Pt returned call. Advised her of Regina's note. Pt stated she will call back at a later time schedule appointment to discuss this with provider in person.

## 2023-10-03 NOTE — Telephone Encounter (Signed)
I am not.  If she would like a referral to GI to discuss this lab testing then I can place this referral for her.

## 2023-10-03 NOTE — Telephone Encounter (Signed)
Left message for patient to return call.

## 2023-10-25 ENCOUNTER — Ambulatory Visit (INDEPENDENT_AMBULATORY_CARE_PROVIDER_SITE_OTHER): Payer: No Typology Code available for payment source | Admitting: Internal Medicine

## 2023-10-25 ENCOUNTER — Encounter: Payer: Self-pay | Admitting: Internal Medicine

## 2023-10-25 VITALS — BP 124/70 | Ht 62.0 in | Wt 268.0 lb

## 2023-10-25 DIAGNOSIS — E66813 Obesity, class 3: Secondary | ICD-10-CM | POA: Diagnosis not present

## 2023-10-25 DIAGNOSIS — Z6841 Body Mass Index (BMI) 40.0 and over, adult: Secondary | ICD-10-CM

## 2023-10-25 DIAGNOSIS — I4719 Other supraventricular tachycardia: Secondary | ICD-10-CM | POA: Diagnosis not present

## 2023-10-25 DIAGNOSIS — I10 Essential (primary) hypertension: Secondary | ICD-10-CM | POA: Diagnosis not present

## 2023-10-25 DIAGNOSIS — E039 Hypothyroidism, unspecified: Secondary | ICD-10-CM

## 2023-10-25 DIAGNOSIS — E782 Mixed hyperlipidemia: Secondary | ICD-10-CM

## 2023-10-25 DIAGNOSIS — F5104 Psychophysiologic insomnia: Secondary | ICD-10-CM

## 2023-10-25 DIAGNOSIS — K219 Gastro-esophageal reflux disease without esophagitis: Secondary | ICD-10-CM

## 2023-10-25 DIAGNOSIS — F419 Anxiety disorder, unspecified: Secondary | ICD-10-CM

## 2023-10-25 DIAGNOSIS — I700 Atherosclerosis of aorta: Secondary | ICD-10-CM | POA: Insufficient documentation

## 2023-10-25 DIAGNOSIS — R7303 Prediabetes: Secondary | ICD-10-CM | POA: Diagnosis not present

## 2023-10-25 DIAGNOSIS — F331 Major depressive disorder, recurrent, moderate: Secondary | ICD-10-CM | POA: Diagnosis not present

## 2023-10-25 DIAGNOSIS — N3281 Overactive bladder: Secondary | ICD-10-CM | POA: Insufficient documentation

## 2023-10-25 MED ORDER — ASPIRIN 81 MG PO TBEC: 81.0000 mg | DELAYED_RELEASE_TABLET | Freq: Every day | ORAL | Status: AC

## 2023-10-25 NOTE — Assessment & Plan Note (Signed)
 Continue Ditropan Encourage Kegel exercises and timed voiding

## 2023-10-25 NOTE — Assessment & Plan Note (Signed)
 Will have her start baby aspirin 81 mg daily C-Met and lipid profile today Continue atorvastatin

## 2023-10-25 NOTE — Assessment & Plan Note (Signed)
Not medicated Support offered 

## 2023-10-25 NOTE — Assessment & Plan Note (Signed)
 C-Met and lipid profile today Continue atorvastatin Encouraged her to consume a low-fat diet

## 2023-10-25 NOTE — Assessment & Plan Note (Signed)
 Encouraged diet and exercise for weight loss ?

## 2023-10-25 NOTE — Assessment & Plan Note (Signed)
Try to avoid foods that trigger reflux Encourage weight loss as this can help reduce reflux symptoms Continue Omeprazole 

## 2023-10-25 NOTE — Assessment & Plan Note (Signed)
Controlled off meds Reinforced DASH diet and exercise for weight loss 

## 2023-10-25 NOTE — Assessment & Plan Note (Signed)
Continue Ambien. 

## 2023-10-25 NOTE — Patient Instructions (Signed)
 Atherosclerosis  Atherosclerosis is when plaque builds up in the arteries. This causes narrowing and hardening of the arteries. Arteries are blood vessels that carry blood from the heart to all parts of the body. This blood contains oxygen. Plaque occurs due to inflammation or from a buildup of fat, cholesterol, calcium, waste products of cells, and a clotting material in the blood (fibrin). Plaque decreases the amount of blood that can flow through the artery. Atherosclerosis can affect any artery in your body, including: Heart arteries. Damage to these arteries may lead to coronary artery disease, which can cause a heart attack. Brain arteries. Damage to these arteries may cause a stroke. Leg, arm, and pelvis arteries. Peripheral artery disease (PAD) may result from damage to these arteries. Kidney arteries. Kidney (renal) failure may result from damage to kidney arteries. Treatment may slow the disease and prevent further damage to your heart, brain, peripheral arteries, and kidneys. What are the causes? This condition develops slowly over many years. The inner layers of your arteries become damaged and allow the gradual buildup of plaque. The exact cause of atherosclerosis is not fully understood. Symptoms of atherosclerosis do not occur until an artery becomes narrow or blocked. What increases the risk? The following factors may make you more likely to develop this condition: Being middle-aged or older. Certain medical conditions, including: High blood pressure. High cholesterol. High blood fats (triglycerides). Diabetes. Sleep apnea. Obesity. Certain lab levels, including: Elevated C-reactive protein (CRP). This is a sign of increased inflammation in your body. Elevated homocysteine levels. This is an amino acid that is associated with heart and blood vessel disease. Using tobacco or nicotine products. A family history of atherosclerosis. Not exercising enough (sedentary  lifestyle). Being stressed. Drinking too much alcohol or using drugs, such as cocaine or methamphetamine. What are the signs or symptoms? Symptoms of atherosclerosis do not occur until the plaque severely narrows or blocks the artery, which decreases blood flow. Sometimes, atherosclerosis does not cause symptoms. Symptoms of this condition include: Coronary artery disease. This may cause chest pain and shortness of breath. Decreased blood supply to your brain, which may cause a stroke. Signs of a stroke may include sudden: Weakness or numbness in your face, arm, or leg, especially on one side of your body. Trouble walking or difficulty moving your arms or legs. Loss of balance or coordination. Confusion. Slurred speech. Trouble speaking, or trouble understanding speech, or both (aphasia). Vision changes in one or both eyes. This may be double vision, blurred vision, or loss of vision. Severe headache with no known cause. The headache is often described as the worst headache ever experienced. PAD, which may cause pain, numbness, or nonhealing wounds, often in your legs and hips. Renal failure. This may cause tiredness, problems with urination, swelling, and itchy skin. How is this diagnosed? This condition is diagnosed based on your medical history and a physical exam. During the exam, your health care provider will: Check your pulse in different places. Listen for a "whooshing" sound over your arteries (bruit). You may also have tests, such as: Blood tests to check your levels of cholesterol, triglycerides, blood sugar, and CRP. Ankle-brachial index to compare blood pressure in your arms to blood pressure in your ankles to see how your blood is flowing. Heart (cardiac) tests. Electrocardiogram (ECG) to check for heart damage. Stress test to see how your heart reacts to exercise. Ultrasound tests. Ultrasound of your peripheral arteries to check blood flow. Echocardiogram to get images of  your heart's  chambers and valves. X-ray tests. Chest X-ray to see if you have an enlarged heart, which is a sign of heart failure. CT scan to check for damage to your heart, brain, or arteries. Angiogram. This is a test where dye is injected and X-rays are used to see the blood flow in the arteries. How is this treated? This condition is treated with lifestyle changes as the first step. These may include: Changing your diet. Losing weight. Reducing stress. Exercising and being physically active more regularly. Quitting smoking. You may also need medicine to: Lower triglycerides and cholesterol. Control blood pressure. Prevent blood clots. Lower inflammation in your body. Control your blood sugar. Sometimes, surgery is needed to: Remove plaque from an artery (endarterectomy). Open or widen a narrowed heart artery or peripheral artery (angioplasty). Create a new path for your blood with one of these procedures: Heart (coronary) artery bypass graft surgery. Peripheral artery bypass graft surgery. Place a small mesh tube (stent) in an artery to open or widen a narrowed artery. Follow these instructions at home: Eating and drinking  Eat a heart-healthy diet. Talk with your health care provider or a dietitian if you need help. A heart-healthy diet involves: Limiting unhealthy fats and increasing healthy fats. Some examples of healthy fats are avocados and olive oil. Eating plant-based foods, such as fruits, vegetables, nuts, whole grains, and legumes (such as peas and lentils). If you drink alcohol: Limit how much you have to: 0-1 drink a day for women who are not pregnant. 0-2 drinks a day for men. Know how much alcohol is in a drink. In the [place] one drink equals one 12 oz bottle of beer (355 mL), one 5 oz glass of wine (148 mL), or one 1 oz glass of hard liquor (44 mL). Lifestyle  Maintain a healthy weight. Lose weight if your health care provider says that you need to do  that. Follow an exercise program as told by your health care provider. Do not use any products that contain nicotine or tobacco. These products include cigarettes, chewing tobacco, and vaping devices, such as e-cigarettes. If you need help quitting, ask your health care provider. Do not use drugs. General instructions Take over-the-counter and prescription medicines only as told by your health care provider. Manage other health conditions as told. Keep all follow-up visits. This is important. Contact a health care provider if you have: An irregular heartbeat. Unexplained tiredness (fatigue). Trouble urinating, or you are producing less urine or foamy urine. Swelling of your hands or feet, or itchy skin. Unexplained pain or numbness in your legs or hips. A wound that is slow to heal or is not healing. Get help right away if: You have any symptoms of a heart attack. These may be: Chest pain. This includes squeezing chest pain that may feel like indigestion (angina). Shortness of breath. Pain in your neck, jaw, arms, back, or stomach. Cold sweat. Nausea. Light-headedness. Sudden pain, numbness, or coldness in a limb. You have any symptoms of a stroke. "BE FAST" is an easy way to remember the main warning signs of a stroke: B - Balance. Signs are dizziness, sudden trouble walking, or loss of balance. E - Eyes. Signs are trouble seeing or a sudden change in vision. F - Face. Signs are sudden weakness or numbness of the face, or the face or eyelid drooping on one side. A - Arms. Signs are weakness or numbness in an arm. This happens suddenly and usually on one side of the body. S -  Speech. Signs are sudden trouble speaking, slurred speech, or trouble understanding what people say. T - Time. Time to call emergency services. Write down what time symptoms started. You have other signs of a stroke, such as: A sudden, severe headache with no known cause. Nausea or vomiting. Seizure. These  symptoms may represent a serious problem that is an emergency. Do not wait to see if the symptoms will go away. Get medical help right away. Call your local emergency services (911 in the [place] Do not drive yourself to the hospital. Summary Atherosclerosis is when plaque builds up in the arteries and causes narrowing and hardening of the arteries. Plaque occurs due to inflammation or from a buildup of fat, cholesterol, calcium, cellular waste products, and fibrin. This condition may not cause any symptoms. Symptoms of atherosclerosis do not occur until the plaque severely narrows or blocks the artery. Treatment starts with lifestyle changes and may include medicines. In some cases, surgery is needed. Get help right away if you have any symptoms of a heart attack or stroke. This information is not intended to replace advice given to you by your health care provider. Make sure you discuss any questions you have with your health care provider. Document Revised: 01/05/2021 Document Reviewed: 01/05/2021 Elsevier Patient Education  2024 [employer].

## 2023-10-25 NOTE — Assessment & Plan Note (Signed)
 A1c today Encourage low-carb diet and exercise for weight loss

## 2023-10-25 NOTE — Progress Notes (Signed)
 Subjective:    Patient ID: Denise Glass, female    DOB: 1956/02/09, 68 y.o.   MRN: [id number]  HPI  Pt presents to the clinic today for follow up of chronic conditions.   Atrial tachycardia: Intermittent.She is not currently taking any medications for this.  She does follow with cardiology.  ECG from 04/2021 reviewed.  HTN: Her BP today is 124/70.  She is not currently taking any oral antihypertensive medications at this time.  ECG from 04/2021 reviewed.  HLD with aortic atherosclerosis: Her last LDL was 75, triglycerides 833, 10/2022.  She denies myalgias on atorvastatin .  She is not taking any aspirin .  She does not consume a low-fat diet.  GERD: She is not sure what triggers this. She denies breakthrough on omeprazole.  Upper GI from 02/2022 reviewed.  Insomnia: She has difficulty falling asleep.  She is taking ambien  as needed.  There is no sleep study on file.  Anxiety and depression: Chronic.  However she is not currently taking any medications for this..  She is not currently seeing a therapist.  She denies SI/HI.  Hypothyroidism: She is not taking any medication for her thyroid  at this time.  She does not follow with endocrinology.  OAB: She is taking ditropan  as prescribed.  She does not follow with urology.  Prediabetes: Her last A1c was 6%, 07/2022.  She is not taking any oral diabetic medication at this time.  She does not check her sugars.  She is requesting referral to a vascular surgeon.  She is concerned that she has an iliac vein thrombus.  She has heard of this happening status post COVID-vaccine.  She reports over the last few months she has had extreme fatigue and inability to walk for an extended period of time causing significant concern.  She denies any pain, swelling or redness in her legs.  Review of Systems     Past Medical History:  Diagnosis Date   Complication of anesthesia    pt reports her appetite takes 2-3 weeks to come back after anesthesia.    Depression    DJD (degenerative joint disease)    GERD (gastroesophageal reflux disease)    Headache 2016   cluster head aches, Head aches have subsided.   Hypothyroidism    Insomnia    Mitral valve prolapse 2003   Followed by Dr. Florencio (prn)   PONV (postoperative nausea and vomiting) 2002   With BTL   Urine frequency     Current Outpatient Medications  Medication Sig Dispense Refill   atorvastatin  (LIPITOR) 10 MG tablet TAKE 1 TABLET BY MOUTH EVERY DAY 90 tablet 1   diphenhydrAMINE  HCl (BENADRYL  ALLERGY PO) Take by mouth.     HYDROcodone-acetaminophen  (NORCO/VICODIN) 5-325 MG tablet Take 1 tablet by mouth 2 (two) times daily.     ibuprofen (ADVIL) 200 MG tablet Take by mouth.     meclizine  (ANTIVERT ) 25 MG tablet Take 1 tablet (25 mg total) by mouth 3 (three) times daily as needed for dizziness. 30 tablet 1   omeprazole (PRILOSEC) 20 MG capsule Take by mouth.     oxybutynin  (DITROPAN  XL) 15 MG 24 hr tablet TAKE 1 TABLET BY MOUTH EVERYDAY AT BEDTIME 90 tablet 1   zolpidem  (AMBIEN ) 10 MG tablet Take 1 tablet (10 mg total) by mouth at bedtime as needed. for sleep 30 tablet 0   No current facility-administered medications for this visit.    Allergies  Allergen Reactions   Other Other (See Comments)  Prednisone Other (See Comments)    Mouth sores and thrush    Sulfa Antibiotics Other (See Comments) and Nausea And Vomiting    Unknown Stevens-Johnson Syndrome (affecting mucous membranes)   Prednazoline Other (See Comments)    Family History  Problem Relation Age of Onset   COPD Mother    Heart disease Mother    Stroke Mother    Heart disease Father    COPD Father    Cancer Father        throat   Healthy Brother    Drug abuse Brother    Healthy Son     Social History   Socioeconomic History   Marital status: Widowed    Spouse name: Not on file   Number of children: Not on file   Years of education: Not on file   Highest education level: Not on file   Occupational History   Occupation: retired  Tobacco Use   Smoking status: Never   Smokeless tobacco: Never  Vaping Use   Vaping status: Never Used  Substance and Sexual Activity   Alcohol use: No   Drug use: No   Sexual activity: Not Currently  Other Topics Concern   Not on file  Social History Narrative   Patient is a 68 y/o widow (2011) who lives independently at home. 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 She has one son, a daughter-in-law and a grandson that live locally.  She struggles with her relationships with mother and brother.   Social Drivers of Corporate Investment Banker Strain: Low Risk  (02/16/2023)   Overall Financial Resource Strain (CARDIA)    Difficulty of Paying Living Expenses: Not hard at all  Food Insecurity: No Food Insecurity (02/16/2023)   Hunger Vital Sign    Worried About Running Out of Food in the Last Year: Never true    Ran Out of Food in the Last Year: Never true  Transportation Needs: No Transportation Needs (02/16/2023)   PRAPARE - Administrator, Civil Service (Medical): No    Lack of Transportation (Non-Medical): No  Physical Activity: Inactive (02/16/2023)   Exercise Vital Sign    Days of Exercise per Week: 0 days    Minutes of Exercise per Session: 0 min  Stress: No Stress Concern Present (02/16/2023)   [employer] of Occupational Health - Occupational Stress Questionnaire    Feeling of Stress : Not at all  Social Connections: Moderately Isolated (02/16/2023)   Social Connection and Isolation Panel [NHANES]    Frequency of Communication with Friends and Family: Twice a week    Frequency of Social Gatherings with Friends and Family: Once a week    Attends Religious Services: More than 4 times per year    Active Member of [employer] or Organizations: No    Attends Banker Meetings: Never    Marital Status: Divorced  Catering Manager Violence: Not At Risk (02/16/2023)   Humiliation,  Afraid, Rape, and Kick questionnaire    Fear of Current or Ex-Partner: No    Emotionally Abused: No    Physically Abused: No    Sexually Abused: No     Constitutional: Pt reports fatigue. Denies fever, malaise, headache or abrupt weight changes.  HEENT: Denies eye pain, eye redness, ear pain, ringing in the ears, wax buildup, runny nose, nasal congestion, bloody nose, or sore throat. Respiratory: Denies difficulty breathing, shortness of breath, cough or sputum production.   Cardiovascular: Denies chest  pain, chest tightness, palpitations or swelling in the hands or feet.  Gastrointestinal: Denies abdominal pain, bloating, constipation, diarrhea or blood in the stool.  GU: Patient reports incontinence.  Denies urgency, frequency, pain with urination, burning sensation, blood in urine, odor or discharge. Musculoskeletal: Patient reports difficulty with gait.  Denies decrease in range of motion, muscle pain or joint pain and swelling.  Skin: Denies redness, rashes, lesions or ulcercations.  Neurological: Patient reports insomnia.  Denies dizziness, difficulty with memory, difficulty with speech or problems with balance and coordination.  Psych: Patient has a history of anxiety and depression.  Denies SI/HI.  No other specific complaints in a complete review of systems (except as listed in HPI above).  Objective:   Physical Exam BP 124/70   Ht 5' 2 (1.575 m)   Wt 268 lb (121.6 kg)   BMI 49.02 kg/m    Wt Readings from Last 3 Encounters:  02/16/23 265 lb (120.2 kg)  01/04/23 265 lb 6.4 oz (120.4 kg)  08/14/22 270 lb (122.5 kg)    General: Appears her stated age, obese, in NAD. Skin: Warm, dry and intact. No ulcerations noted. HEENT: Head: normal shape and size; Eyes: sclera white PERRLA and EOMs intact;  Neck:  Neck supple, trachea midline. No masses, lumps or thyromegaly present.  Cardiovascular: Normal rate and rhythm. S1,S2 noted.  No murmur, rubs or gallops noted. No JVD or  BLE edema. No carotid bruits noted. Pulmonary/Chest: Normal effort and positive vesicular breath sounds. No respiratory distress. No wheezes, rales or ronchi noted.  Abdomen: Normal bowel sounds.  Musculoskeletal: Gait slow and steady without device.  Neurological: Alert and oriented.  Psychiatric: Mood and affect normal. Behavior is normal. Judgment and thought content normal.     BMET    Component Value Date/Time   NA 140 01/04/2023 1516   NA 138 11/14/2019 1451   NA 140 01/17/2015 1431   K 3.9 01/04/2023 1516   K 4.5 01/17/2015 1431   CL 103 01/04/2023 1516   CL 104 01/17/2015 1431   CO2 25 01/04/2023 1516   CO2 29 01/17/2015 1431   GLUCOSE 114 (H) 01/04/2023 1516   GLUCOSE 107 (H) 01/17/2015 1431   BUN 9 01/04/2023 1516   BUN 13 11/14/2019 1451   BUN 13 01/17/2015 1431   CREATININE 0.89 01/04/2023 1516   CALCIUM  9.7 01/04/2023 1516   CALCIUM  10.2 01/17/2015 1431   GFRNONAA >60 03/19/2020 0022   GFRNONAA >60 01/17/2015 1431   GFRAA >60 03/19/2020 0022   GFRAA >60 01/17/2015 1431    Lipid Panel     Component Value Date/Time   CHOL 161 11/14/2022 1315   TRIG 166 (H) 11/14/2022 1315   HDL 62 11/14/2022 1315   CHOLHDL 2.6 11/14/2022 1315   VLDL 31 (H) 05/31/2017 0837   LDLCALC 75 11/14/2022 1315    CBC    Component Value Date/Time   WBC 9.6 01/04/2023 1516   RBC 5.11 (H) 01/04/2023 1516   HGB 15.2 01/04/2023 1516   HGB 15.3 11/14/2019 1451   HCT 45.6 (H) 01/04/2023 1516   HCT 44.6 11/14/2019 1451   PLT 332 01/04/2023 1516   PLT 343 11/14/2019 1451   MCV 89.2 01/04/2023 1516   MCV 88 11/14/2019 1451   MCV 89 01/17/2015 1431   MCH 29.7 01/04/2023 1516   MCHC 33.3 01/04/2023 1516   RDW 13.0 01/04/2023 1516   RDW 13.3 11/14/2019 1451   RDW 13.5 01/17/2015 1431   LYMPHSABS 1.9 11/14/2019 1451  MONOABS 528 05/31/2017 0837   EOSABS 0.2 11/14/2019 1451   BASOSABS 0.0 11/14/2019 1451    Hgb A1C Lab Results  Component Value Date   HGBA1C 6.0 (H)  08/14/2022           Assessment & Plan:    RTC in 6 months for annual exam Angeline Laura, NP

## 2023-10-25 NOTE — Assessment & Plan Note (Signed)
TSH today Not currently medicated

## 2023-10-25 NOTE — Assessment & Plan Note (Signed)
 Not medicated She will continue to follow cardiology

## 2023-10-26 ENCOUNTER — Telehealth: Payer: Self-pay

## 2023-10-26 DIAGNOSIS — F5104 Psychophysiologic insomnia: Secondary | ICD-10-CM

## 2023-10-26 LAB — COMPLETE METABOLIC PANEL WITH GFR
AG Ratio: 1.4 (calc) (ref 1.0–2.5)
ALT: 12 U/L (ref 6–29)
AST: 16 U/L (ref 10–35)
Albumin: 4.1 g/dL (ref 3.6–5.1)
Alkaline phosphatase (APISO): 109 U/L (ref 37–153)
BUN: 9 mg/dL (ref 7–25)
CO2: 23 mmol/L (ref 20–32)
Calcium: 9.5 mg/dL (ref 8.6–10.4)
Chloride: 103 mmol/L (ref 98–110)
Creat: 0.86 mg/dL (ref 0.50–1.05)
Globulin: 2.9 g/dL (ref 1.9–3.7)
Glucose, Bld: 116 mg/dL — ABNORMAL HIGH (ref 65–99)
Potassium: 3.8 mmol/L (ref 3.5–5.3)
Sodium: 139 mmol/L (ref 135–146)
Total Bilirubin: 0.5 mg/dL (ref 0.2–1.2)
Total Protein: 7 g/dL (ref 6.1–8.1)
eGFR: 74 mL/min/{1.73_m2} (ref >=60)

## 2023-10-26 LAB — CBC
HCT: 45.5 % — ABNORMAL HIGH (ref 35.0–45.0)
Hemoglobin: 15.2 g/dL (ref 11.7–15.5)
MCH: 30.2 pg (ref 27.0–33.0)
MCHC: 33.4 g/dL (ref 32.0–36.0)
MCV: 90.3 fL (ref 80.0–100.0)
MPV: 10.7 fL (ref 7.5–12.5)
Platelets: 339 10*3/uL (ref 140–400)
RBC: 5.04 10*6/uL (ref 3.80–5.10)
RDW: 12.9 % (ref 11.0–15.0)
WBC: 9.2 10*3/uL (ref 3.8–10.8)

## 2023-10-26 LAB — LIPID PANEL
Cholesterol: 182 mg/dL (ref <200)
HDL: 61 mg/dL (ref >=50)
LDL Cholesterol (Calc): 93 mg/dL
Non-HDL Cholesterol (Calc): 121 mg/dL (ref <130)
Total CHOL/HDL Ratio: 3 (calc) (ref <5.0)
Triglycerides: 183 mg/dL — ABNORMAL HIGH (ref <150)

## 2023-10-26 LAB — HEMOGLOBIN A1C
Hgb A1c MFr Bld: 6.1 %{Hb} — ABNORMAL HIGH (ref <5.7)
Mean Plasma Glucose: 128 mg/dL
eAG (mmol/L): 7.1 mmol/L

## 2023-10-26 LAB — T4, FREE: Free T4: 1.1 ng/dL (ref 0.8–1.8)

## 2023-10-26 LAB — TSH: TSH: 3.79 m[IU]/L (ref 0.40–4.50)

## 2023-10-26 MED ORDER — ZOLPIDEM TARTRATE 10 MG PO TABS
10.0000 mg | ORAL_TABLET | Freq: Every evening | ORAL | 0 refills | Status: DC | PRN
Start: 2023-10-26 — End: 2024-03-24

## 2023-10-26 NOTE — Telephone Encounter (Signed)
 Ambien refilled

## 2023-10-26 NOTE — Progress Notes (Signed)
 Patient notified of lab results and to increase atorvastatin.   Verbal understanding

## 2023-10-26 NOTE — Telephone Encounter (Signed)
 Refill Remus Loffler

## 2023-10-26 NOTE — Addendum Note (Signed)
 Addended by: Lorre Munroe on: 10/26/2023 09:04 AM   Modules accepted: Orders

## 2023-11-02 ENCOUNTER — Telehealth: Payer: Self-pay

## 2023-11-02 DIAGNOSIS — M79604 Pain in right leg: Secondary | ICD-10-CM

## 2023-11-02 NOTE — Telephone Encounter (Signed)
Ultrasound ordered

## 2023-11-02 NOTE — Telephone Encounter (Signed)
Copied from CRM [id number]. Topic: Referral - Question >> Nov 02, 2023 10:36 AM Clide Dales wrote: Patient called to see if the referral to Vascular had been sent. No referral in chart. Please advise.

## 2023-11-02 NOTE — Telephone Encounter (Signed)
I reached out to vascular to see if they would see her for her concern.  They recommended that she had imaging done before they would see her.  I advised her I could order the imaging however I am not sure if insurance would approve it based on her reasoning for wanting the scan done.  Please let me know if she would like me to proceed with trying to ordering the scan for her.

## 2023-11-02 NOTE — Addendum Note (Signed)
Addended by: Lorre Munroe on: 11/02/2023 11:49 AM   Modules accepted: Orders

## 2023-11-07 ENCOUNTER — Ambulatory Visit
Admission: RE | Admit: 2023-11-07 | Discharge: 2023-11-07 | Disposition: A | Discharge status: Home / Self Care | Payer: No Typology Code available for payment source | Source: Ambulatory Visit | Attending: Internal Medicine | Admitting: Internal Medicine

## 2023-11-07 DIAGNOSIS — M79604 Pain in right leg: Secondary | ICD-10-CM | POA: Diagnosis not present

## 2023-11-07 DIAGNOSIS — M79605 Pain in left leg: Secondary | ICD-10-CM | POA: Diagnosis not present

## 2023-11-09 ENCOUNTER — Encounter: Payer: Self-pay | Admitting: Internal Medicine

## 2023-11-09 ENCOUNTER — Telehealth: Payer: Self-pay

## 2023-11-09 NOTE — Telephone Encounter (Signed)
Pt called for [place] results. Provider has not yet written a note regarding results to share with pt. Pt is requesting that provider review results and complete note. Pt would like a call back regarding results when available.

## 2023-11-09 NOTE — Telephone Encounter (Signed)
Patient has been notified:  No evidence of clot in either lower extremities. I sent to vascular to review. They have no concern about iliac vein thrombus at this time.

## 2023-11-21 DIAGNOSIS — M792 Neuralgia and neuritis, unspecified: Secondary | ICD-10-CM | POA: Diagnosis not present

## 2023-11-21 DIAGNOSIS — Z79899 Other long term (current) drug therapy: Secondary | ICD-10-CM | POA: Diagnosis not present

## 2023-11-21 DIAGNOSIS — Z5181 Encounter for therapeutic drug level monitoring: Secondary | ICD-10-CM | POA: Diagnosis not present

## 2023-11-21 DIAGNOSIS — G894 Chronic pain syndrome: Secondary | ICD-10-CM | POA: Diagnosis not present

## 2023-11-22 ENCOUNTER — Telehealth: Payer: Self-pay

## 2023-11-22 ENCOUNTER — Other Ambulatory Visit: Payer: Self-pay

## 2023-11-22 MED ORDER — ATORVASTATIN CALCIUM 20 MG PO TABS: 20.0000 mg | ORAL_TABLET | Freq: Every day | ORAL | 3 refills | Status: AC

## 2023-11-22 NOTE — Telephone Encounter (Signed)
 Copied from CRM [id number]. Topic: General - Inquiry >> Nov 22, 2023  2:12 PM Teressa P wrote: Reason for CRM: pt called saying she went up on the atorvastatin  last time she came in.  She as taking 10 mg and changed to 20 mg.  She is about out of the 10 mg and needs a 20 mg atorvastatin  sent to the pharmacy.SABRA [phone] [facility]

## 2023-11-22 NOTE — Telephone Encounter (Signed)
 Patient notified prescription was sent in. Verbal understanding

## 2024-02-19 ENCOUNTER — Other Ambulatory Visit: Payer: Self-pay | Admitting: Internal Medicine

## 2024-02-20 NOTE — Telephone Encounter (Signed)
 Requested Prescriptions  Pending Prescriptions Disp Refills   oxybutynin  (DITROPAN  XL) 15 MG 24 hr tablet [Pharmacy Med Name: OXYBUTYNIN  CL ER 15 MG TABLET] 90 tablet 1    Sig: TAKE 1 TABLET BY MOUTH EVERYDAY AT BEDTIME     Urology:  Bladder Agents Failed - 02/20/2024  2:10 PM      Failed - Valid encounter within last 12 months    Recent Outpatient Visits   None     Future Appointments             In 2 months Baity, Rankin Buzzard, NP [facility], [facility]

## 2024-03-03 ENCOUNTER — Ambulatory Visit: Payer: Self-pay

## 2024-03-03 NOTE — Telephone Encounter (Signed)
 Chief Complaint: watery diarrhea Symptoms: watery diarrhea, abd pain Frequency: about a month Pertinent Negatives: Patient denies fever, blood in stool, N/V in last 24 hours,  Disposition: [] ED /[] Urgent Care (no appt availability in office) / [x] Appointment(In office/virtual)/ []  [facility]/ [] Home Care/ [] Refused Recommended Disposition /[] [facility]/ []  Follow-up with PCP Additional Notes: Pt states that has been ongoing issue. States she is having diarrhea approx 3 times per day. Pt states that she has been keeping up on her fluids. Pt states that she has been more bloated. Pt denies blood in stool. Pt states no foreign travel. Routing HP to clinic for scheduling.  Copied from CRM [id number]. Topic: Clinical - Red Word Triage >> Mar 03, 2024  1:27 PM Leory Rands wrote: Red Word that prompted transfer to Nurse Triage: Patient is calling to request a referral to GI. Stomach pian with diarrhea. Referral was placed last year and the patient never went. Please advise Reason for Disposition  [1] Mild diarrhea (e.g., 1-3 or more stools than normal in past 24 hours) without known cause AND [2] present >  7 days  Answer Assessment - Initial Assessment Questions 1. DIARRHEA SEVERITY: "How bad is the diarrhea?" "How many more stools have you had in the past 24 hours than normal?"    - NO DIARRHEA (SCALE 0)   - MILD (SCALE 1-3): Few loose or mushy BMs; increase of 1-3 stools over normal daily number of stools; mild increase in ostomy output.   -  MODERATE (SCALE 4-7): Increase of 4-6 stools daily over normal; moderate increase in ostomy output.   -  SEVERE (SCALE 8-10; OR "WORST POSSIBLE"): Increase of 7 or more stools daily over normal; moderate increase in ostomy output; incontinence.     Mild  2. ONSET: "When did the diarrhea begin?"      About a month ago 3. BM CONSISTENCY: "How loose or watery is the diarrhea?"      water 4. VOMITING: "Are you also vomiting?" If Yes, ask:  "How many times in the past 24 hours?"      None in last 24 hours, pt states when this started she had N/V 5. ABDOMEN PAIN: "Are you having any abdomen pain?" If Yes, ask: "What does it feel like?" (e.g., crampy, dull, intermittent, constant)      Feels like a fist hitting the sides of my intestines 6. ABDOMEN PAIN SEVERITY: If present, ask: "How bad is the pain?"  (e.g., Scale 1-10; mild, moderate, or severe)   - MILD (1-3): doesn't interfere with normal activities, abdomen soft and not tender to touch    - MODERATE (4-7): interferes with normal activities or awakens from sleep, abdomen tender to touch    - SEVERE (8-10): excruciating pain, doubled over, unable to do any normal activities       8 7. ORAL INTAKE: If vomiting, "Have you been able to drink liquids?" "How much liquids have you had in the past 24 hours?"     Yes still drinking  8. HYDRATION: "Any signs of dehydration?" (e.g., dry mouth [not just dry lips], too weak to stand, dizziness, new weight loss) "When did you last urinate?"     Denies  9. EXPOSURE: "Have you traveled to a foreign country recently?" "Have you been exposed to anyone with diarrhea?" "Could you have eaten any food that was spoiled?"     denies 10. ANTIBIOTIC USE: "Are you taking antibiotics now or have you taken antibiotics in the past  2 months?"       denies 11. OTHER SYMPTOMS: "Do you have any other symptoms?" (e.g., fever, blood in stool)       denies  Protocols used: Diarrhea-A-AH

## 2024-03-03 NOTE — Telephone Encounter (Signed)
 Appointment made for patient to see DR [facility] tomorrow

## 2024-03-04 ENCOUNTER — Ambulatory Visit (INDEPENDENT_AMBULATORY_CARE_PROVIDER_SITE_OTHER): Admitting: Family Medicine

## 2024-03-04 ENCOUNTER — Encounter: Payer: Self-pay | Admitting: Family Medicine

## 2024-03-04 ENCOUNTER — Ambulatory Visit
Admission: RE | Admit: 2024-03-04 | Discharge: 2024-03-04 | Disposition: A | Discharge status: Home / Self Care | Source: Ambulatory Visit | Attending: Family Medicine | Admitting: Family Medicine

## 2024-03-04 VITALS — BP 122/78 | HR 79 | Ht 62.0 in | Wt 267.0 lb

## 2024-03-04 DIAGNOSIS — R1084 Generalized abdominal pain: Secondary | ICD-10-CM

## 2024-03-04 DIAGNOSIS — A084 Viral intestinal infection, unspecified: Secondary | ICD-10-CM

## 2024-03-04 DIAGNOSIS — R112 Nausea with vomiting, unspecified: Secondary | ICD-10-CM

## 2024-03-04 DIAGNOSIS — K5900 Constipation, unspecified: Secondary | ICD-10-CM | POA: Diagnosis not present

## 2024-03-04 DIAGNOSIS — R197 Diarrhea, unspecified: Secondary | ICD-10-CM

## 2024-03-04 DIAGNOSIS — R109 Unspecified abdominal pain: Secondary | ICD-10-CM

## 2024-03-04 DIAGNOSIS — R1032 Left lower quadrant pain: Secondary | ICD-10-CM

## 2024-03-04 DIAGNOSIS — Z96641 Presence of right artificial hip joint: Secondary | ICD-10-CM | POA: Diagnosis not present

## 2024-03-04 MED ORDER — ONDANSETRON 4 MG PO TBDP: 4.0000 mg | ORAL_TABLET | Freq: Three times a day (TID) | ORAL | 0 refills | Status: AC | PRN

## 2024-03-04 MED ORDER — DICYCLOMINE HCL 10 MG PO CAPS
10.0000 mg | ORAL_CAPSULE | Freq: Three times a day (TID) | ORAL | 0 refills | Status: DC
Start: 2024-03-04 — End: 2024-08-05

## 2024-03-04 MED ORDER — MECLIZINE HCL 25 MG PO TABS: 25.0000 mg | ORAL_TABLET | Freq: Three times a day (TID) | ORAL | 1 refills | Status: DC | PRN

## 2024-03-04 NOTE — Patient Instructions (Addendum)
 Thank you for coming to the office today.  Likely viral gastroenteritis stomach bug with both diarrhea and vomiting  Zofran  dissolving tab as needed for nausea vomiting  Dicyclomine for abdominal pain and cramping  Stool studies ordered to rule out infection  Possible diverticulitis  X=ray STAT today to evaluate air / stool levels, can help us  rule out constipation stool blockage, and the liquid stool can run around it.  If severe worsening or cannot keep food fluids down, go to hospital ED for CT  After initial imaging here, we can consider authorizing a CT if needed as well.  I do think you need to go back to GI at [facility]  As we spoke you did miss the prior colonoscopy since they could not perform it, so it is likely time to repeat this.  [facility] [address] [place], [place] [address] Hours: 8AM-5PM Phone: [phone]   Please schedule a Follow-up Appointment to: Return if symptoms worsen or fail to improve.  If you have any other questions or concerns, please feel free to call the office or send a message through MyChart. You may also schedule an earlier appointment if necessary.  Additionally, you may be receiving a survey about your experience at our office within a few days to 1 week by e-mail or mail. We value your feedback.  Domingo Friend, DO [facility], [place]

## 2024-03-04 NOTE — Progress Notes (Signed)
 Subjective:    Patient ID: Denise Glass, female    DOB: 03-03-1956, 68 y.o.   MRN: [id number]  Denise Glass is a 68 y.o. female presenting on 03/04/2024 for Abdominal Pain, Diarrhea, and Nausea  Patient presents for a same day appointment.  PCP Helayne Lo, FNP   HPI  Discussed the use of AI scribe software for clinical note transcription with the patient, who gave verbal consent to proceed.  History of Present Illness   Denise Glass is a 68 year old female with diverticulosis who presents with persistent gastrointestinal symptoms including nausea, vomiting, and diarrhea.  She has been experiencing persistent gastrointestinal symptoms since February, characterized by alternating episodes of nausea, vomiting, and diarrhea. She describes her condition as having 'puke days' and 'poop days', with today being a 'puke day'. The symptoms have been ongoing for over a week, with no significant relief.  She uses meclizine  for nausea, which is effective most of the time, although there have been instances where it did not completely alleviate her symptoms. Dizziness accompanies nausea occasionally, as noted on Sunday, but this is not a regular occurrence.  Her diarrhea is described as watery, with an initial episode of pure water followed by very soft stools. She is concerned about the wateriness of her stools. She has used Imodium in the past but did not specify recent use.  She was diagnosed with diverticulosis after a CT scan last year and avoids popcorn and nuts as dietary precautions. She has not had a colonoscopy since 2018, and a recent attempt was unsuccessful due to incomplete bowel preparation.  She recalls a severe episode of vomiting four weeks ago, initially thought to be food poisoning, characterized by violent vomiting lasting 24 hours, followed by difficulty eating and recovery over several days.  She reports a decreased ability to eat large meals, noting that she can no  longer consume a hamburger and fries in one sitting and often eats smaller portions over time. She attributes this change to aging but is uncertain of the cause.  No recent antibiotic use, fever, or chills. She is able to keep down liquids despite her symptoms.      Patient requesting Zolpidem  for refill in future  Additionally she has multiple major life stressors she has experienced and describes it has affected her mood with some depressive symptoms.  Health Maintenance:  Last Colonoscopy 10/2016 - incomplete, needs repeat     03/04/2024   11:19 AM 10/25/2023    1:16 PM 02/16/2023   11:05 AM  Depression screen PHQ 2/9  Decreased Interest 3 3 0  Down, Depressed, Hopeless 3 3 1   PHQ - 2 Score 6 6 1   Altered sleeping 3 3 0  Tired, decreased energy 3 3 0  Change in appetite 3 0 0  Feeling bad or failure about yourself  3 0 0  Trouble concentrating 3 0 0  Moving slowly or fidgety/restless 0 0 0  Suicidal thoughts 2 3 0  PHQ-9 Score 23 15 1   Difficult doing work/chores Extremely dIfficult  Not difficult at all   [place]-Suicide Severity Rating Scale 1) Have you wished you were dead or wished you could go to sleep and not wake up? - Yes  2) Have you had any actual thoughts of killing yourself? - No  Skip questions 3,4, 5  6) Have you ever done anything, started to do anything, or prepared to do anything to end your life? - No  03/04/2024   11:19 AM 10/25/2023    1:16 PM 01/04/2023    3:11 PM 08/14/2022    4:39 PM  GAD 7 : Generalized Anxiety Score  Nervous, Anxious, on Edge 1 2 0 0  Control/stop worrying 2 2 0 1  Worry too much - different things 2 2 0 1  Trouble relaxing 0 0 0 0  Restless 0 0 0 0  Easily annoyed or irritable 1 0 0 0  Afraid - awful might happen 3 0 0 0  Total GAD 7 Score 9 6 0 2  Anxiety Difficulty Somewhat difficult  Not difficult at all     Social History   Tobacco Use   Smoking status: Never   Smokeless tobacco: Never  Vaping Use   Vaping  status: Never Used  Substance Use Topics   Alcohol use: No   Drug use: No    Review of Systems Per HPI unless specifically indicated above     Objective:     BP 122/78 (BP Location: Right Wrist, Patient Position: Sitting, Cuff Size: Normal)   Pulse 79   Ht 5\' 2"  (1.575 m)   Wt 267 lb (121.1 kg)   SpO2 93%   BMI 48.83 kg/m   Wt Readings from Last 3 Encounters:  03/04/24 267 lb (121.1 kg)  10/25/23 268 lb (121.6 kg)  02/16/23 265 lb (120.2 kg)    Physical Exam Vitals and nursing note reviewed.  Constitutional:      General: She is not in acute distress.    Appearance: Normal appearance. She is well-developed. She is not diaphoretic.     Comments: Well-appearing, comfortable, cooperative  HENT:     Head: Normocephalic and atraumatic.  Eyes:     General:        Right eye: No discharge.        Left eye: No discharge.     Conjunctiva/sclera: Conjunctivae normal.  Cardiovascular:     Rate and Rhythm: Normal rate.  Pulmonary:     Effort: Pulmonary effort is normal.  Abdominal:     General: There is no distension.     Palpations: Abdomen is soft. There is no mass.     Tenderness: There is no abdominal tenderness. There is no guarding or rebound.     Comments: No tenderness on exam. She has some hyperactive bowel sounds.  Skin:    General: Skin is warm and dry.     Findings: No erythema or rash.  Neurological:     Mental Status: She is alert and oriented to person, place, and time.  Psychiatric:        Mood and Affect: Mood normal.        Behavior: Behavior normal.        Thought Content: Thought content normal.     Comments: Well groomed, good eye contact, normal speech and thoughts    I have personally reviewed the radiology report from 03/04/24 on STAT KUB X-ray.  Narrative & Impression  CLINICAL DATA:  abdominal pain diarrhea, watery stool rule out constipation   EXAM: ABDOMEN - 1 VIEW   COMPARISON:  CT 01/11/2023   FINDINGS: The bowel gas pattern is  normal. No radio-opaque calculi. Stable visualized components of right hip arthroplasty. Left hip DJD.   IMPRESSION: Negative.     Electronically Signed   By: Nicoletta Barrier M.D.   On: 03/04/2024 14:48    Results for orders placed or performed in visit on 10/25/23  CBC  Collection Time: 10/25/23  1:24 PM  Result Value Ref Range   WBC 9.2 3.8 - 10.8 Thousand/uL   RBC 5.04 3.80 - 5.10 Million/uL   Hemoglobin 15.2 11.7 - 15.5 g/dL   HCT 91.4 (H) 78.2 - 95.6 %   MCV 90.3 80.0 - 100.0 fL   MCH 30.2 27.0 - 33.0 pg   MCHC 33.4 32.0 - 36.0 g/dL   RDW 21.3 08.6 - 57.8 %   Platelets 339 140 - 400 Thousand/uL   MPV 10.7 7.5 - 12.5 fL  COMPLETE METABOLIC PANEL WITH GFR   Collection Time: 10/25/23  1:24 PM  Result Value Ref Range   Glucose, Bld 116 (H) 65 - 99 mg/dL   BUN 9 7 - 25 mg/dL   Creat 4.69 6.29 - 5.28 mg/dL   eGFR 74 > OR = 60 UX/LKG/4.01U2   BUN/Creatinine Ratio SEE NOTE: 6 - 22 (calc)   Sodium 139 135 - 146 mmol/L   Potassium 3.8 3.5 - 5.3 mmol/L   Chloride 103 98 - 110 mmol/L   CO2 23 20 - 32 mmol/L   Calcium  9.5 8.6 - 10.4 mg/dL   Total Protein 7.0 6.1 - 8.1 g/dL   Albumin 4.1 3.6 - 5.1 g/dL   Globulin 2.9 1.9 - 3.7 g/dL (calc)   AG Ratio 1.4 1.0 - 2.5 (calc)   Total Bilirubin 0.5 0.2 - 1.2 mg/dL   Alkaline phosphatase (APISO) 109 37 - 153 U/L   AST 16 10 - 35 U/L   ALT 12 6 - 29 U/L  TSH   Collection Time: 10/25/23  1:24 PM  Result Value Ref Range   TSH 3.79 0.40 - 4.50 mIU/L  T4, free   Collection Time: 10/25/23  1:24 PM  Result Value Ref Range   Free T4 1.1 0.8 - 1.8 ng/dL  Lipid panel   Collection Time: 10/25/23  1:24 PM  Result Value Ref Range   Cholesterol 182 <200 mg/dL   HDL 61 > OR = 50 mg/dL   Triglycerides 725 (H) <150 mg/dL   LDL Cholesterol (Calc) 93 mg/dL (calc)   Total CHOL/HDL Ratio 3.0 <5.0 (calc)   Non-HDL Cholesterol (Calc) 121 <130 mg/dL (calc)  Hemoglobin D6U   Collection Time: 10/25/23  1:24 PM  Result Value Ref Range   Hgb A1c  MFr Bld 6.1 (H) <5.7 % of total Hgb   Mean Plasma Glucose 128 mg/dL   eAG (mmol/L) 7.1 mmol/L      Assessment & Plan:   Problem List Items Addressed This Visit   None Visit Diagnoses       Viral gastroenteritis    -  Primary   Relevant Orders   DG Abd 1 View     Generalized abdominal pain       Relevant Orders   DG Abd 1 View     LLQ abdominal pain         Nausea and vomiting, unspecified vomiting type       Relevant Medications   ondansetron  (ZOFRAN -ODT) 4 MG disintegrating tablet   meclizine  (ANTIVERT ) 25 MG tablet     Abdominal cramping       Relevant Medications   dicyclomine (BENTYL) 10 MG capsule     Diarrhea, unspecified type       Relevant Orders   Gastrointestinal Pathogen Pnl RT, PCR   C. difficile GDH and Toxin A/B       Acute on Chronic GI Symptoms / Diarrhea / Abdominal pain cramping Suspected viral  Gastroenteritis and colitis of unspecified origin Intermittent watery diarrhea, nausea, and abdominal pain suggest viral infection or IBS.  Possible diverticulitis (note no blood in stool) No recent antibiotic use or fever.  Symptoms persistent over a week. However has had other similar flares ongoing over past several months by her report  Check KUB X-ray today. Results called to patient after 5pm. Negative KUB. Ruled out bowel blockage or abnormal gas pattern or fecal impaction  - Order stool studies to rule out infection. Collect later and drop off tomorrow likely  - Prescribe dicyclomine for abdominal pain and cramping. - Prescribe Zofran  dissolving tablets as needed for nausea and vomiting. - Advise use of Imodium for diarrhea. OTC - Consider CT scan if symptoms worsen or if unable to keep food and fluids down, with potential ED referral.  Incomplete colonoscopy two years ago due to preparation issues. Repeat colonoscopy indicated due to ongoing digestive issues. - Advise contacting GI at [facility] to schedule a repeat colonoscopy and follow up on  her GI symptoms if current acute work up is negative.     Insomnia / Depression She will need re order of Zolpidem  and has significant depression symptoms with some major life stressors. Advised her that with current acute diarrheal symptoms, would avoid starting immediately on new medication for mood and if she physically feels better then can follow up with PCP to do focused visit on mood.   Orders Placed This Encounter  Procedures   DG Abd 1 View    Standing Status:   Future    Number of Occurrences:   1    Expiration Date:   06/04/2024    Reason for Exam (SYMPTOM  OR DIAGNOSIS REQUIRED):   abdominal pain diarrhea, watery stool rule out constipation    Preferred imaging location?:   [facility]   Gastrointestinal Pathogen Pnl RT, PCR   C. difficile GDH and Toxin A/B    Meds ordered this encounter  Medications   ondansetron  (ZOFRAN -ODT) 4 MG disintegrating tablet    Sig: Take 1 tablet (4 mg total) by mouth every 8 (eight) hours as needed for nausea or vomiting.    Dispense:  30 tablet    Refill:  0   dicyclomine (BENTYL) 10 MG capsule    Sig: Take 1 capsule (10 mg total) by mouth 4 (four) times daily -  before meals and at bedtime. As needed    Dispense:  30 capsule    Refill:  0   meclizine  (ANTIVERT ) 25 MG tablet    Sig: Take 1 tablet (25 mg total) by mouth 3 (three) times daily as needed for dizziness.    Dispense:  30 tablet    Refill:  1    Follow up plan: Return if symptoms worsen or fail to improve.  Domingo Friend, DO [facility] 03/04/2024, 11:36 AM

## 2024-03-05 NOTE — Progress Notes (Signed)
 Please call patient and see if she would like to schedule a follow-up with me before July to discuss these ongoing GI issues, anxiety and depression.

## 2024-03-11 DIAGNOSIS — R197 Diarrhea, unspecified: Secondary | ICD-10-CM | POA: Diagnosis not present

## 2024-03-13 ENCOUNTER — Ambulatory Visit: Payer: Self-pay | Admitting: Family Medicine

## 2024-03-14 LAB — C. DIFFICILE GDH AND TOXIN A/B
GDH ANTIGEN: NOT DETECTED
MICRO NUMBER:: 16505314
SPECIMEN QUALITY:: ADEQUATE
TOXIN A AND B: NOT DETECTED

## 2024-03-14 LAB — GASTROINTESTINAL PATHOGEN PNL

## 2024-03-17 ENCOUNTER — Ambulatory Visit (INDEPENDENT_AMBULATORY_CARE_PROVIDER_SITE_OTHER): Admitting: Internal Medicine

## 2024-03-17 ENCOUNTER — Encounter: Payer: Self-pay | Admitting: Internal Medicine

## 2024-03-17 VITALS — BP 130/90 | HR 90 | Ht 62.0 in | Wt 266.2 lb

## 2024-03-17 DIAGNOSIS — R7303 Prediabetes: Secondary | ICD-10-CM

## 2024-03-17 DIAGNOSIS — E782 Mixed hyperlipidemia: Secondary | ICD-10-CM | POA: Diagnosis not present

## 2024-03-17 DIAGNOSIS — K582 Mixed irritable bowel syndrome: Secondary | ICD-10-CM

## 2024-03-17 DIAGNOSIS — E039 Hypothyroidism, unspecified: Secondary | ICD-10-CM

## 2024-03-17 DIAGNOSIS — E66813 Obesity, class 3: Secondary | ICD-10-CM | POA: Diagnosis not present

## 2024-03-17 DIAGNOSIS — Z0001 Encounter for general adult medical examination with abnormal findings: Secondary | ICD-10-CM | POA: Diagnosis not present

## 2024-03-17 DIAGNOSIS — R112 Nausea with vomiting, unspecified: Secondary | ICD-10-CM

## 2024-03-17 DIAGNOSIS — Z6841 Body Mass Index (BMI) 40.0 and over, adult: Secondary | ICD-10-CM

## 2024-03-17 NOTE — Patient Instructions (Signed)
 Health Maintenance for Postmenopausal Women Menopause is a normal process in which your ability to get pregnant comes to an end. This process happens slowly over many months or years, usually between the ages of 24 and 62. Menopause is complete when you have missed your menstrual period for 12 months. It is important to talk with your health care provider about some of the most common conditions that affect women after menopause (postmenopausal women). These include heart disease, cancer, and bone loss (osteoporosis). Adopting a healthy lifestyle and getting preventive care can help to promote your health and wellness. The actions you take can also lower your chances of developing some of these common conditions. What are the signs and symptoms of menopause? During menopause, you may have the following symptoms: Hot flashes. These can be moderate or severe. Night sweats. Decrease in sex drive. Mood swings. Headaches. Tiredness (fatigue). Irritability. Memory problems. Problems falling asleep or staying asleep. Talk with your health care provider about treatment options for your symptoms. Do I need hormone replacement therapy? Hormone replacement therapy is effective in treating symptoms that are caused by menopause, such as hot flashes and night sweats. Hormone replacement carries certain risks, especially as you become older. If you are thinking about using estrogen or estrogen with progestin, discuss the benefits and risks with your health care provider. How can I reduce my risk for heart disease and stroke? The risk of heart disease, heart attack, and stroke increases as you age. One of the causes may be a change in the body's hormones during menopause. This can affect how your body uses dietary fats, triglycerides, and cholesterol. Heart attack and stroke are medical emergencies. There are many things that you can do to help prevent heart disease and stroke. Watch your blood pressure High  blood pressure causes heart disease and increases the risk of stroke. This is more likely to develop in people who have high blood pressure readings or are overweight. Have your blood pressure checked: Every 3-5 years if you are 50-75 years of age. Every year if you are 77 years old or older. Eat a healthy diet  Eat a diet that includes plenty of vegetables, fruits, low-fat dairy products, and lean protein. Do not eat a lot of foods that are high in solid fats, added sugars, or sodium. Get regular exercise Get regular exercise. This is one of the most important things you can do for your health. Most adults should: Try to exercise for at least 150 minutes each week. The exercise should increase your heart rate and make you sweat (moderate-intensity exercise). Try to do strengthening exercises at least twice each week. Do these in addition to the moderate-intensity exercise. Spend less time sitting. Even light physical activity can be beneficial. Other tips Work with your health care provider to achieve or maintain a healthy weight. Do not use any products that contain nicotine or tobacco. These products include cigarettes, chewing tobacco, and vaping devices, such as e-cigarettes. If you need help quitting, ask your health care provider. Know your numbers. Ask your health care provider to check your cholesterol and your blood sugar (glucose). Continue to have your blood tested as directed by your health care provider. Do I need screening for cancer? Depending on your health history and family history, you may need to have cancer screenings at different stages of your life. This may include screening for: Breast cancer. Cervical cancer. Lung cancer. Colorectal cancer. What is my risk for osteoporosis? After menopause, you may be  at increased risk for osteoporosis. Osteoporosis is a condition in which bone destruction happens more quickly than new bone creation. To help prevent osteoporosis or  the bone fractures that can happen because of osteoporosis, you may take the following actions: If you are 61-3 years old, get at least 1,000 mg of calcium and at least 600 international units (IU) of vitamin D per day. If you are older than age 61 but younger than age 75, get at least 1,200 mg of calcium and at least 600 international units (IU) of vitamin D per day. If you are older than age 62, get at least 1,200 mg of calcium and at least 800 international units (IU) of vitamin D per day. Smoking and drinking excessive alcohol increase the risk of osteoporosis. Eat foods that are rich in calcium and vitamin D, and do weight-bearing exercises several times each week as directed by your health care provider. How does menopause affect my mental health? Depression may occur at any age, but it is more common as you become older. Common symptoms of depression include: Feeling depressed. Changes in sleep patterns. Changes in appetite or eating patterns. Feeling an overall lack of motivation or enjoyment of activities that you previously enjoyed. Frequent crying spells. Talk with your health care provider if you think that you are experiencing any of these symptoms. General instructions See your health care provider for regular wellness exams and vaccines. This may include: Scheduling regular health, dental, and eye exams. Getting and maintaining your vaccines. These include: Influenza vaccine. Get this vaccine each year before the flu season begins. Pneumonia vaccine. Shingles vaccine. Tetanus, diphtheria, and pertussis (Tdap) booster vaccine. Your health care provider may also recommend other immunizations. Tell your health care provider if you have ever been abused or do not feel safe at home. Summary Menopause is a normal process in which your ability to get pregnant comes to an end. This condition causes hot flashes, night sweats, decreased interest in sex, mood swings, headaches, or lack  of sleep. Treatment for this condition may include hormone replacement therapy. Take actions to keep yourself healthy, including exercising regularly, eating a healthy diet, watching your weight, and checking your blood pressure and blood sugar levels. Get screened for cancer and depression. Make sure that you are up to date with all your vaccines. This information is not intended to replace advice given to you by your health care provider. Make sure you discuss any questions you have with your health care provider. Document Revised: 02/21/2021 Document Reviewed: 02/21/2021 Elsevier Patient Education  2024 [employer].

## 2024-03-17 NOTE — Progress Notes (Signed)
 Subjective:    Patient ID: Denise Glass, female    DOB: November 06, 1955, 68 y.o.   MRN: [id number]  HPI  Patient presents to clinic today for her annual exam.  Flu: 07/2022 Tetanus: 09/2012 COVID: [employer] x1 Pneumovax: 06/2020 Prevnar 20: 07/2022 Shingrix: Never Pap smear: No longer screening Mammogram: >2 years ago Bone density: Never Colon screening: 10/19/2016 Vision screening: annually Dentist: biannually  Diet: She does eat meat. She consumes fruits and veggies.  She does eat some fried foods. She drinks mostly tea and soda. Exercise: None  Review of Systems  Past Medical History:  Diagnosis Date   Complication of anesthesia    pt reports her appetite takes 2-3 weeks to come back after anesthesia.   Depression    DJD (degenerative joint disease)    GERD (gastroesophageal reflux disease)    Headache 2016   cluster head aches, Head aches have subsided.   Hypothyroidism    Insomnia    Mitral valve prolapse 2003   Followed by Dr. Beau Bound (prn)   PONV (postoperative nausea and vomiting) 2002   With BTL   Urine frequency     Current Outpatient Medications  Medication Sig Dispense Refill   aspirin  EC 81 MG tablet Take 1 tablet (81 mg total) by mouth daily. Swallow whole.     atorvastatin  (LIPITOR) 10 MG tablet TAKE 1 TABLET BY MOUTH EVERY DAY 90 tablet 1   atorvastatin  (LIPITOR) 20 MG tablet Take 1 tablet (20 mg total) by mouth daily. 90 tablet 3   dicyclomine  (BENTYL ) 10 MG capsule Take 1 capsule (10 mg total) by mouth 4 (four) times daily -  before meals and at bedtime. As needed 30 capsule 0   diphenhydrAMINE  HCl (BENADRYL  ALLERGY PO) Take by mouth.     HYDROcodone-acetaminophen  (NORCO/VICODIN) 5-325 MG tablet Take 1 tablet by mouth 2 (two) times daily.     ibuprofen (ADVIL) 200 MG tablet Take by mouth.     meclizine  (ANTIVERT ) 25 MG tablet Take 1 tablet (25 mg total) by mouth 3 (three) times daily as needed for dizziness. 30 tablet 1   omeprazole (PRILOSEC) 20 MG  capsule Take by mouth.     ondansetron  (ZOFRAN -ODT) 4 MG disintegrating tablet Take 1 tablet (4 mg total) by mouth every 8 (eight) hours as needed for nausea or vomiting. 30 tablet 0   oxybutynin  (DITROPAN  XL) 15 MG 24 hr tablet TAKE 1 TABLET BY MOUTH EVERYDAY AT BEDTIME 90 tablet 1   zolpidem  (AMBIEN ) 10 MG tablet Take 1 tablet (10 mg total) by mouth at bedtime as needed. for sleep 30 tablet 0   No current facility-administered medications for this visit.    Allergies  Allergen Reactions   Other Other (See Comments)   Prednisone Other (See Comments)    Mouth sores and thrush    Sulfa Antibiotics Other (See Comments) and Nausea And Vomiting    Unknown Stevens-Johnson Syndrome (affecting mucous membranes)   Prednazoline Other (See Comments)    Family History  Problem Relation Age of Onset   COPD Mother    Heart disease Mother    Stroke Mother    Heart disease Father    COPD Father    Cancer Father        throat   Healthy Brother    Drug abuse Brother    Healthy Son     Social History   Socioeconomic History   Marital status: Widowed    Spouse name: Not on file  Number of children: Not on file   Years of education: Not on file   Highest education level: GED or equivalent  Occupational History   Occupation: retired  Tobacco Use   Smoking status: Never   Smokeless tobacco: Never  Vaping Use   Vaping status: Never Used  Substance and Sexual Activity   Alcohol use: No   Drug use: No   Sexual activity: Not Currently  Other Topics Concern   Not on file  Social History Narrative   Patient is a 68 y/o widow (2011) who lives independently at home. 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 She has one son, a daughter-in-law and a grandson that live locally.  She struggles with her relationships with mother and brother.   Social Drivers of Corporate investment banker Strain: Low Risk  (03/16/2024)   Overall Financial Resource Strain  (CARDIA)    Difficulty of Paying Living Expenses: Not hard at all  Food Insecurity: No Food Insecurity (03/16/2024)   Hunger Vital Sign    Worried About Running Out of Food in the Last Year: Never true    Ran Out of Food in the Last Year: Never true  Transportation Needs: No Transportation Needs (03/16/2024)   PRAPARE - Administrator, Civil Service (Medical): No    Lack of Transportation (Non-Medical): No  Physical Activity: Unknown (03/16/2024)   Exercise Vital Sign    Days of Exercise per Week: 0 days    Minutes of Exercise per Session: Not on file  Stress: Stress Concern Present (03/16/2024)   [employer] of Occupational Health - Occupational Stress Questionnaire    Feeling of Stress : Rather much  Social Connections: Socially Isolated (03/16/2024)   Social Connection and Isolation Panel [NHANES]    Frequency of Communication with Friends and Family: Once a week    Frequency of Social Gatherings with Friends and Family: Once a week    Attends Religious Services: Never    Database administrator or Organizations: No    Attends Engineer, structural: Not on file    Marital Status: Widowed  Intimate Partner Violence: Not At Risk (02/16/2023)   Humiliation, Afraid, Rape, and Kick questionnaire    Fear of Current or Ex-Partner: No    Emotionally Abused: No    Physically Abused: No    Sexually Abused: No     Constitutional: Denies fever, malaise, fatigue, headache or abrupt weight changes.  HEENT: Denies eye pain, eye redness, ear pain, ringing in the ears, wax buildup, runny nose, nasal congestion, bloody nose, or sore throat. Respiratory: Denies difficulty breathing, shortness of breath, cough or sputum production.   Cardiovascular: Denies chest pain, chest tightness, palpitations or swelling in the hands or feet.  Gastrointestinal: Patient reports epigastric pain, frequent nausea and vomiting.  Denies bloating, constipation, diarrhea or blood in the stool.  GU:  Denies urgency, frequency, pain with urination, burning sensation, blood in urine, odor or discharge. Musculoskeletal: Denies decrease in range of motion, difficulty with gait, muscle pain or joint pain and swelling.  Skin: Pt reports skin picking disorder. Denies redness, rashes, lesions or ulcercations.  Neurological: Patient reports neuropathic pain, and insomnia.  Denies dizziness, difficulty with memory, difficulty with speech or problems with balance and coordination.  Psych: Patient has a history of anxiety and depression.  Denies SI/HI.  No other specific complaints in a complete review of systems (except as listed in HPI above).  Objective:   Physical Exam  BP (!) 130/90   Pulse 90   Ht 5\' 2"  (1.575 m)   Wt 266 lb 4 oz (120.8 kg)   BMI 48.70 kg/m    Wt Readings from Last 3 Encounters:  03/04/24 267 lb (121.1 kg)  10/25/23 268 lb (121.6 kg)  02/16/23 265 lb (120.2 kg)    General: Appears her stated age, obese, in NAD. Skin: Warm, dry and intact.  Scabbed lesion noted of central portion of scalp. HEENT: Head: normal shape and size; Eyes: sclera white, no icterus, conjunctiva pink, PERRLA and EOMs intact;  Neck:  Neck supple, trachea midline. No masses, lumps or thyromegaly present.  Cardiovascular: Normal rate and rhythm. S1,S2 noted.  No murmur, rubs or gallops noted. No JVD or BLE edema. No carotid bruits noted. Pulmonary/Chest: Normal effort and positive vesicular breath sounds. No respiratory distress. No wheezes, rales or ronchi noted.  Abdomen: Soft, tender in epigastric region.  Normal bowel sounds.  No distention or masses noted. Musculoskeletal: Strength 5/5 BUE/BLE. No difficulty with gait.  Neurological: Alert and oriented. Cranial nerves II-XII grossly intact. Coordination normal.  Psychiatric: Mood and affect normal.  Mildly anxious appearing. Judgment and thought content normal.    BMET    Component Value Date/Time   NA 139 10/25/2023 1324   NA 138  11/14/2019 1451   NA 140 01/17/2015 1431   K 3.8 10/25/2023 1324   K 4.5 01/17/2015 1431   CL 103 10/25/2023 1324   CL 104 01/17/2015 1431   CO2 23 10/25/2023 1324   CO2 29 01/17/2015 1431   GLUCOSE 116 (H) 10/25/2023 1324   GLUCOSE 107 (H) 01/17/2015 1431   BUN 9 10/25/2023 1324   BUN 13 11/14/2019 1451   BUN 13 01/17/2015 1431   CREATININE 0.86 10/25/2023 1324   CALCIUM  9.5 10/25/2023 1324   CALCIUM  10.2 01/17/2015 1431   GFRNONAA >60 03/19/2020 0022   GFRNONAA >60 01/17/2015 1431   GFRAA >60 03/19/2020 0022   GFRAA >60 01/17/2015 1431    Lipid Panel     Component Value Date/Time   CHOL 182 10/25/2023 1324   TRIG 183 (H) 10/25/2023 1324   HDL 61 10/25/2023 1324   CHOLHDL 3.0 10/25/2023 1324   VLDL 31 (H) 05/31/2017 0837   LDLCALC 93 10/25/2023 1324    CBC    Component Value Date/Time   WBC 9.2 10/25/2023 1324   RBC 5.04 10/25/2023 1324   HGB 15.2 10/25/2023 1324   HGB 15.3 11/14/2019 1451   HCT 45.5 (H) 10/25/2023 1324   HCT 44.6 11/14/2019 1451   PLT 339 10/25/2023 1324   PLT 343 11/14/2019 1451   MCV 90.3 10/25/2023 1324   MCV 88 11/14/2019 1451   MCV 89 01/17/2015 1431   MCH 30.2 10/25/2023 1324   MCHC 33.4 10/25/2023 1324   RDW 12.9 10/25/2023 1324   RDW 13.3 11/14/2019 1451   RDW 13.5 01/17/2015 1431   LYMPHSABS 1.9 11/14/2019 1451   MONOABS 528 05/31/2017 0837   EOSABS 0.2 11/14/2019 1451   BASOSABS 0.0 11/14/2019 1451    Hgb A1C Lab Results  Component Value Date   HGBA1C 6.1 (H) 10/25/2023           Assessment & Plan:   Preventative Health Maintenance:  Encouraged her to get a flu shot in the fall She declines tetanus for financial reasons, advised if she gets bit or cut to go get this done Pneumovax and Prevnar UTD Discussed Shingrix vaccine,  she will check coverage with her insurance company and schedule a visit at the pharmacy if she would like to have this done Encouraged her to get her COVID booster She no longer wants to  screen for cervical cancer She declines mammogram and bone density Colon screening UTD Encouraged her to consume a balanced diet and exercise regimen Advised her to see an eye doctor and dentist annually We will check CBC, c-Met, lipid, A1c today  RTC in 6 months, follow-up chronic conditions Helayne Lo, NP

## 2024-03-17 NOTE — Assessment & Plan Note (Signed)
 Encouraged diet and exercise for weight loss ?

## 2024-03-18 ENCOUNTER — Ambulatory Visit: Payer: Self-pay | Admitting: Internal Medicine

## 2024-03-19 ENCOUNTER — Telehealth: Payer: Self-pay

## 2024-03-19 ENCOUNTER — Other Ambulatory Visit: Payer: Self-pay | Admitting: Internal Medicine

## 2024-03-19 DIAGNOSIS — M792 Neuralgia and neuritis, unspecified: Secondary | ICD-10-CM | POA: Diagnosis not present

## 2024-03-19 DIAGNOSIS — Z79899 Other long term (current) drug therapy: Secondary | ICD-10-CM | POA: Diagnosis not present

## 2024-03-19 DIAGNOSIS — G894 Chronic pain syndrome: Secondary | ICD-10-CM | POA: Diagnosis not present

## 2024-03-19 DIAGNOSIS — M47812 Spondylosis without myelopathy or radiculopathy, cervical region: Secondary | ICD-10-CM | POA: Diagnosis not present

## 2024-03-19 NOTE — Telephone Encounter (Signed)
 Copied from CRM [id number]. Topic: Referral - Question >> Mar 19, 2024  4:13 PM Star [place] wrote: Reason for CRM: Patient called [facility], she can't get in until November.

## 2024-03-19 NOTE — Telephone Encounter (Unsigned)
 Copied from CRM [id number]. Topic: Clinical - Medication Refill >> Mar 19, 2024  4:11 PM Star East wrote: Medication: zolpidem  (AMBIEN ) 10 MG tablet  Has the patient contacted their pharmacy? Yes (Agent: If no, request that the patient contact the pharmacy for the refill. If patient does not wish to contact the pharmacy document the reason why and proceed with request.) (Agent: If yes, when and what did the pharmacy advise?)  This is the patient's preferred pharmacy:  [id number] - [place], [place] - [address] [place] [address] Phone: [phone] Fax: [phone]  Is this the correct pharmacy for this prescription? Yes If no, delete pharmacy and type the correct one.   Has the prescription been filled recently? Yes  Is the patient out of the medication? No  Has the patient been seen for an appointment in the last year OR does the patient have an upcoming appointment? Yes  Can we respond through MyChart? No  Agent: Please be advised that Rx refills may take up to 3 business days. We ask that you follow-up with your pharmacy.

## 2024-03-20 NOTE — Telephone Encounter (Signed)
 Patient advised per [place] Record and verbalized understanding.

## 2024-03-20 NOTE — Telephone Encounter (Signed)
 All of the GI doctors in the area are backed up due to the closing of [facility].  The other practices are not taking patients that have been established elsewhere.  I would recommend that she call and get on a cancel wait list if possible.

## 2024-03-21 ENCOUNTER — Other Ambulatory Visit: Payer: Self-pay | Admitting: Internal Medicine

## 2024-03-21 DIAGNOSIS — F5104 Psychophysiologic insomnia: Secondary | ICD-10-CM

## 2024-03-24 NOTE — Telephone Encounter (Signed)
 Requested medications are due for refill today.  yes  Requested medications are on the active medications list.  yes  Last refill. 10/26/2023 330 0 rf  Future visit scheduled.   yes  Notes to clinic.  Refill not delegated.    Requested Prescriptions  Pending Prescriptions Disp Refills   zolpidem  (AMBIEN ) 10 MG tablet [Pharmacy Med Name: ZOLPIDEM  TARTRATE 10 MG TABLET] 30 tablet 0    Sig: Take 1 tablet (10 mg total) by mouth at bedtime as needed. for sleep     Not Delegated - Psychiatry:  Anxiolytics/Hypnotics Failed - 03/24/2024 11:35 AM      Failed - This refill cannot be delegated      Failed - Urine Drug Screen completed in last 360 days      Passed - Valid encounter within last 6 months    Recent Outpatient Visits           1 week ago Encounter for general adult medical examination with abnormal findings   [facility] [place], Rankin Buzzard, NP   2 weeks ago Viral gastroenteritis   [facility], Kayleen Party, [place]

## 2024-03-28 DIAGNOSIS — L814 Other melanin hyperpigmentation: Secondary | ICD-10-CM | POA: Diagnosis not present

## 2024-03-28 DIAGNOSIS — D485 Neoplasm of uncertain behavior of skin: Secondary | ICD-10-CM | POA: Diagnosis not present

## 2024-03-28 DIAGNOSIS — L570 Actinic keratosis: Secondary | ICD-10-CM | POA: Diagnosis not present

## 2024-03-31 ENCOUNTER — Ambulatory Visit: Payer: Self-pay

## 2024-03-31 NOTE — Telephone Encounter (Signed)
Will discuss at upcoming appt tomorrow °

## 2024-03-31 NOTE — Telephone Encounter (Signed)
 Summary: Possibe UTI   Copied From CRM [id number]. Reason for Triage: Possible UTI, a little burning after urination, frequent urination, pressure and urge to urine and nothing really coming out. Wants to go in and give a urine sample.  Ludie [phone]         FYI Only or Action Required?: FYI only for provider  Patient was last seen in primary care on 03/17/2024 by Carollynn Cirri, NP. Called Nurse Triage reporting Dysuria. Symptoms began several days ago. Interventions attempted: Nothing. Symptoms are: gradually worsening.  Triage Disposition: See Physician Within 24 Hours  Patient/caregiver understands and will follow disposition?: Yes  Reason for Disposition  Urinating more frequently than usual (i.e., frequency)  Answer Assessment - Initial Assessment Questions 1. SYMPTOM: What's the main symptom you're concerned about? (e.g., frequency, incontinence)     Frequent urination  2. ONSET: When did the  uti symptoms  start?     2 nights ago  3. PAIN: Is there any pain? If Yes, ask: How bad is it? (Scale: 1-10; mild, moderate, severe)     Mild pain  4. CAUSE: What do you think is causing the symptoms?     Concerned for UTI  5. OTHER SYMPTOMS: Do you have any other symptoms? (e.g., blood in urine, fever, flank pain, pain with urination)     Burning with urination and lower abd pressure  6. PREGNANCY: Is there any chance you are pregnant? When was your last menstrual period?     no  Protocols used: Urinary Symptoms-A-AH

## 2024-04-01 ENCOUNTER — Encounter: Payer: Self-pay | Admitting: Internal Medicine

## 2024-04-01 ENCOUNTER — Ambulatory Visit (INDEPENDENT_AMBULATORY_CARE_PROVIDER_SITE_OTHER): Admitting: Internal Medicine

## 2024-04-01 VITALS — BP 150/94 | Ht 62.0 in | Wt 266.4 lb

## 2024-04-01 DIAGNOSIS — R030 Elevated blood-pressure reading, without diagnosis of hypertension: Secondary | ICD-10-CM | POA: Diagnosis not present

## 2024-04-01 DIAGNOSIS — R300 Dysuria: Secondary | ICD-10-CM

## 2024-04-01 LAB — POCT URINE DIPSTICK
Bilirubin, UA: NEGATIVE
Glucose, UA: NEGATIVE mg/dL
Ketones, POC UA: NEGATIVE mg/dL
Nitrite, UA: POSITIVE — AB
Spec Grav, UA: 1.02 (ref 1.010–1.025)
Urobilinogen, UA: 0.2 U/dL
pH, UA: 6 (ref 5.0–8.0)

## 2024-04-01 MED ORDER — NITROFURANTOIN MONOHYD MACRO 100 MG PO CAPS
100.0000 mg | ORAL_CAPSULE | Freq: Two times a day (BID) | ORAL | 0 refills | Status: DC
Start: 2024-04-01 — End: 2024-05-12

## 2024-04-01 NOTE — Progress Notes (Signed)
 Subjective:    Patient ID: Denise Glass, female    DOB: August 28, 1956, 68 y.o.   MRN: [id number]  HPI  Discussed the use of AI scribe software for clinical note transcription with the patient, who gave verbal consent to proceed.  Denise Glass is a 68 year old female who presents with 3 days of  urinary symptoms.  She experiences a burning sensation during urination, which has improved but persists. No increased frequency of urination, bladder pressure, hematuria, fever, chills, nausea, or vomiting. She attributes the lack of frequent urination to her use of oxybutynin . She has not taken any over-the-counter medications but has been drinking cran grape juice.  Of note, her BP today is 163/90.  She reports her blood pressure typically is normal at home.  She has been having some hip pain, recently had a biopsy and now is having urinary symptoms which she feels like is attributed to her elevated blood pressure.  Review of Systems  Past Medical History:  Diagnosis Date   Complication of anesthesia    pt reports her appetite takes 2-3 weeks to come back after anesthesia.   Depression    DJD (degenerative joint disease)    GERD (gastroesophageal reflux disease)    Headache 2016   cluster head aches, Head aches have subsided.   Hypothyroidism    Insomnia    Mitral valve prolapse 2003   Followed by Dr. Beau Bound (prn)   PONV (postoperative nausea and vomiting) 2002   With BTL   Urine frequency     Current Outpatient Medications  Medication Sig Dispense Refill   aspirin  EC 81 MG tablet Take 1 tablet (81 mg total) by mouth daily. Swallow whole.     atorvastatin  (LIPITOR) 20 MG tablet Take 1 tablet (20 mg total) by mouth daily. 90 tablet 3   dicyclomine  (BENTYL ) 10 MG capsule Take 1 capsule (10 mg total) by mouth 4 (four) times daily -  before meals and at bedtime. As needed 30 capsule 0   diphenhydrAMINE  HCl (BENADRYL  ALLERGY PO) Take by mouth.     HYDROcodone-acetaminophen   (NORCO/VICODIN) 5-325 MG tablet Take 1 tablet by mouth 2 (two) times daily.     ibuprofen (ADVIL) 200 MG tablet Take by mouth.     meclizine  (ANTIVERT ) 25 MG tablet Take 1 tablet (25 mg total) by mouth 3 (three) times daily as needed for dizziness. 30 tablet 1   omeprazole (PRILOSEC) 20 MG capsule Take by mouth.     ondansetron  (ZOFRAN -ODT) 4 MG disintegrating tablet Take 1 tablet (4 mg total) by mouth every 8 (eight) hours as needed for nausea or vomiting. 30 tablet 0   oxybutynin  (DITROPAN  XL) 15 MG 24 hr tablet TAKE 1 TABLET BY MOUTH EVERYDAY AT BEDTIME 90 tablet 1   zolpidem  (AMBIEN ) 10 MG tablet TAKE 1 TABLET (10 MG TOTAL) BY MOUTH AT BEDTIME AS NEEDED FOR SLEEP 30 tablet 0   No current facility-administered medications for this visit.    Allergies  Allergen Reactions   Other Other (See Comments)   Prednisone Other (See Comments)    Mouth sores and thrush    Sulfa Antibiotics Other (See Comments) and Nausea And Vomiting    Unknown Stevens-Johnson Syndrome (affecting mucous membranes)   Prednazoline Other (See Comments)    Family History  Problem Relation Age of Onset   COPD Mother    Heart disease Mother    Stroke Mother    Heart disease Father    COPD Father  Cancer Father        throat   Healthy Brother    Drug abuse Brother    Healthy Son     Social History   Socioeconomic History   Marital status: Widowed    Spouse name: Not on file   Number of children: Not on file   Years of education: Not on file   Highest education level: GED or equivalent  Occupational History   Occupation: retired  Tobacco Use   Smoking status: Never   Smokeless tobacco: Never  Vaping Use   Vaping status: Never Used  Substance and Sexual Activity   Alcohol use: No   Drug use: No   Sexual activity: Not Currently  Other Topics Concern   Not on file  Social History Narrative   Patient is a 68 y/o widow (2011) who lives independently at home. 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 She has one son, a daughter-in-law and a grandson that live locally.  She struggles with her relationships with mother and brother.   Social Drivers of Corporate investment banker Strain: Low Risk  (03/16/2024)   Overall Financial Resource Strain (CARDIA)    Difficulty of Paying Living Expenses: Not hard at all  Food Insecurity: No Food Insecurity (03/16/2024)   Hunger Vital Sign    Worried About Running Out of Food in the Last Year: Never true    Ran Out of Food in the Last Year: Never true  Transportation Needs: No Transportation Needs (03/16/2024)   PRAPARE - Administrator, Civil Service (Medical): No    Lack of Transportation (Non-Medical): No  Physical Activity: Unknown (03/16/2024)   Exercise Vital Sign    Days of Exercise per Week: 0 days    Minutes of Exercise per Session: Not on file  Stress: Stress Concern Present (03/16/2024)   [employer] of Occupational Health - Occupational Stress Questionnaire    Feeling of Stress : Rather much  Social Connections: Socially Isolated (03/16/2024)   Social Connection and Isolation Panel    Frequency of Communication with Friends and Family: Once a week    Frequency of Social Gatherings with Friends and Family: Once a week    Attends Religious Services: Never    Database administrator or Organizations: No    Attends Engineer, structural: Not on file    Marital Status: Widowed  Intimate Partner Violence: Not At Risk (02/16/2023)   Humiliation, Afraid, Rape, and Kick questionnaire    Fear of Current or Ex-Partner: No    Emotionally Abused: No    Physically Abused: No    Sexually Abused: No     Constitutional: Denies fever, malaise, fatigue, headache or abrupt weight changes.  Respiratory: Denies difficulty breathing, shortness of breath, cough or sputum production.   Cardiovascular: Denies chest pain, chest tightness, palpitations or swelling in the hands or feet.   Gastrointestinal: Patient reports epigastric pain, frequent nausea and vomiting.  Denies bloating, constipation, diarrhea or blood in the stool.  GU: Pt reports burning with urination. Denies urgency, frequency, pain with urination, burning sensation, blood in urine, odor or discharge. Musculoskeletal: Denies decrease in range of motion, difficulty with gait, muscle pain or joint pain and swelling.   No other specific complaints in a complete review of systems (except as listed in HPI above).     Objective:   Physical Exam  BP (!) 150/94   Ht 5' 2 (1.575 m)  Wt 266 lb 6.4 oz (120.8 kg)   BMI 48.73 kg/m     Wt Readings from Last 3 Encounters:  03/17/24 266 lb 4 oz (120.8 kg)  03/04/24 267 lb (121.1 kg)  10/25/23 268 lb (121.6 kg)    General: Appears her stated age, obese, in NAD. Cardiovascular: Normal rate and rhythm.  Pulmonary/Chest: Normal effort and positive vesicular breath sounds. No respiratory distress. No wheezes, rales or ronchi noted.  Abdomen: Soft, nontender over the bladder.  Normal bowel sounds.  Neurological: Alert and oriented.   BMET    Component Value Date/Time   NA 136 03/17/2024 1505   NA 138 11/14/2019 1451   NA 140 01/17/2015 1431   K 3.8 03/17/2024 1505   K 4.5 01/17/2015 1431   CL 102 03/17/2024 1505   CL 104 01/17/2015 1431   CO2 21 03/17/2024 1505   CO2 29 01/17/2015 1431   GLUCOSE 173 (H) 03/17/2024 1505   GLUCOSE 107 (H) 01/17/2015 1431   BUN 10 03/17/2024 1505   BUN 13 11/14/2019 1451   BUN 13 01/17/2015 1431   CREATININE 0.93 03/17/2024 1505   CALCIUM  9.3 03/17/2024 1505   CALCIUM  10.2 01/17/2015 1431   GFRNONAA >60 03/19/2020 0022   GFRNONAA >60 01/17/2015 1431   GFRAA >60 03/19/2020 0022   GFRAA >60 01/17/2015 1431    Lipid Panel     Component Value Date/Time   CHOL 134 03/17/2024 1505   TRIG 135 03/17/2024 1505   HDL 59 03/17/2024 1505   CHOLHDL 2.3 03/17/2024 1505   VLDL 31 (H) 05/31/2017 0837   LDLCALC 53  03/17/2024 1505    CBC    Component Value Date/Time   WBC 10.4 03/17/2024 1505   RBC 5.02 03/17/2024 1505   HGB 15.4 03/17/2024 1505   HGB 15.3 11/14/2019 1451   HCT 45.8 (H) 03/17/2024 1505   HCT 44.6 11/14/2019 1451   PLT 298 03/17/2024 1505   PLT 343 11/14/2019 1451   MCV 91.2 03/17/2024 1505   MCV 88 11/14/2019 1451   MCV 89 01/17/2015 1431   MCH 30.7 03/17/2024 1505   MCHC 33.6 03/17/2024 1505   RDW 13.0 03/17/2024 1505   RDW 13.3 11/14/2019 1451   RDW 13.5 01/17/2015 1431   LYMPHSABS 1.9 11/14/2019 1451   MONOABS 528 05/31/2017 0837   EOSABS 0.2 11/14/2019 1451   BASOSABS 0.0 11/14/2019 1451    Hgb A1C Lab Results  Component Value Date   HGBA1C 6.0 (H) 03/17/2024           Assessment & Plan:   Assessment and Plan    Presumed Urinary Tract Infection (UTI) Urinalysis showed moderate leuks, moderate blood and positive nitrates. Oxybutynin  may affect symptoms.  - Prescribed Macrobid  100 mg twice daily for 5 days. - Encouraged increased water intake. - Continue drinking cran grape juice. - Adjust antibiotic based on urine culture results.  Elevated Blood Pressure Reading in Office without Diagnosis of HTN: Manual repeat remains elevated.  She has been under a lot of stress this week -Will monitor blood pressure for now however if remains elevated at next visit, should discuss antihypertensive therapy - Reinforced DASH diet and exercise for weight loss   RTC in 6 months, follow-up chronic conditions Helayne Lo, NP

## 2024-04-01 NOTE — Patient Instructions (Signed)

## 2024-04-04 ENCOUNTER — Ambulatory Visit: Payer: Self-pay | Admitting: Internal Medicine

## 2024-04-04 LAB — URINE CULTURE
MICRO NUMBER:: 16592572
SPECIMEN QUALITY:: ADEQUATE

## 2024-04-11 ENCOUNTER — Encounter: Payer: Self-pay | Admitting: Internal Medicine

## 2024-04-11 ENCOUNTER — Ambulatory Visit (INDEPENDENT_AMBULATORY_CARE_PROVIDER_SITE_OTHER): Admitting: Internal Medicine

## 2024-04-11 ENCOUNTER — Ambulatory Visit: Payer: Self-pay

## 2024-04-11 VITALS — BP 156/90 | Ht 62.0 in | Wt 266.0 lb

## 2024-04-11 DIAGNOSIS — M25552 Pain in left hip: Secondary | ICD-10-CM | POA: Diagnosis not present

## 2024-04-11 DIAGNOSIS — R300 Dysuria: Secondary | ICD-10-CM | POA: Diagnosis not present

## 2024-04-11 DIAGNOSIS — I10 Essential (primary) hypertension: Secondary | ICD-10-CM | POA: Diagnosis not present

## 2024-04-11 DIAGNOSIS — N3000 Acute cystitis without hematuria: Secondary | ICD-10-CM

## 2024-04-11 LAB — POCT URINE DIPSTICK
Bilirubin, UA: NEGATIVE
Glucose, UA: NEGATIVE mg/dL
Ketones, POC UA: NEGATIVE mg/dL
Nitrite, UA: NEGATIVE
POC PROTEIN,UA: NEGATIVE
Spec Grav, UA: 1.015 (ref 1.010–1.025)
Urobilinogen, UA: 0.2 U/dL
pH, UA: 6 (ref 5.0–8.0)

## 2024-04-11 MED ORDER — CIPROFLOXACIN HCL 500 MG PO TABS: 500.0000 mg | ORAL_TABLET | Freq: Two times a day (BID) | ORAL | 0 refills | Status: AC

## 2024-04-11 NOTE — Progress Notes (Signed)
 Subjective:    Patient ID: Denise Glass, female    DOB: February 02, 1956, 68 y.o.   MRN: [id number]  HPI  Discussed the use of AI scribe software for clinical note transcription with the patient, who gave verbal consent to proceed.  Denise Glass is a 68 year old female who presents with recurrent urinary symptoms.  Her urinary symptoms, which had previously resolved, returned yesterday. She experiences burning at the start and end of urination, which she describes as more profound than last week. She also notes a pressure sensation on the left side. No urgency, frequency, or hematuria.  She was seen ten days ago for similar symptoms, at which time her urinalysis was positive and her urine culture grew E. coli. She was treated with Macrobid  for five days, which initially resolved her symptoms. However, the symptoms have recurred.  She experienced nausea and gagging on the morning after starting the previous antibiotic, but this resolved and has not recurred. She is allergic to Septra.  No worsening low back pain, nausea, or vomiting.     Of note, her BP today is 160/83.  She has no history of hypertension although she has had elevated blood pressure readings at her last 3 visits.  She is not currently taking any antihypertensive medicine occasion.  She attributes her elevated blood pressure due to left hip pain and has a meeting with orthopedics July 10.  Review of Systems  Past Medical History:  Diagnosis Date   Complication of anesthesia    pt reports her appetite takes 2-3 weeks to come back after anesthesia.   Depression    DJD (degenerative joint disease)    GERD (gastroesophageal reflux disease)    Headache 2016   cluster head aches, Head aches have subsided.   Hypothyroidism    Insomnia    Mitral valve prolapse 2003   Followed by Dr. Florencio (prn)   PONV (postoperative nausea and vomiting) 2002   With BTL   Urine frequency     Current Outpatient Medications  Medication  Sig Dispense Refill   aspirin  EC 81 MG tablet Take 1 tablet (81 mg total) by mouth daily. Swallow whole.     atorvastatin  (LIPITOR) 20 MG tablet Take 1 tablet (20 mg total) by mouth daily. 90 tablet 3   dicyclomine  (BENTYL ) 10 MG capsule Take 1 capsule (10 mg total) by mouth 4 (four) times daily -  before meals and at bedtime. As needed 30 capsule 0   diphenhydrAMINE  HCl (BENADRYL  ALLERGY PO) Take by mouth.     HYDROcodone-acetaminophen  (NORCO/VICODIN) 5-325 MG tablet Take 1 tablet by mouth 2 (two) times daily.     ibuprofen (ADVIL) 200 MG tablet Take by mouth.     meclizine  (ANTIVERT ) 25 MG tablet Take 1 tablet (25 mg total) by mouth 3 (three) times daily as needed for dizziness. 30 tablet 1   nitrofurantoin , macrocrystal-monohydrate, (MACROBID ) 100 MG capsule Take 1 capsule (100 mg total) by mouth 2 (two) times daily. 10 capsule 0   omeprazole (PRILOSEC) 20 MG capsule Take by mouth.     ondansetron  (ZOFRAN -ODT) 4 MG disintegrating tablet Take 1 tablet (4 mg total) by mouth every 8 (eight) hours as needed for nausea or vomiting. 30 tablet 0   oxybutynin  (DITROPAN  XL) 15 MG 24 hr tablet TAKE 1 TABLET BY MOUTH EVERYDAY AT BEDTIME 90 tablet 1   zolpidem  (AMBIEN ) 10 MG tablet TAKE 1 TABLET (10 MG TOTAL) BY MOUTH AT BEDTIME AS NEEDED FOR SLEEP 30  tablet 0   No current facility-administered medications for this visit.    Allergies  Allergen Reactions   Other Other (See Comments)   Prednisone Other (See Comments)    Mouth sores and thrush    Sulfa Antibiotics Other (See Comments) and Nausea And Vomiting    Unknown Stevens-Johnson Syndrome (affecting mucous membranes)   Prednazoline Other (See Comments)    Family History  Problem Relation Age of Onset   COPD Mother    Heart disease Mother    Stroke Mother    Heart disease Father    COPD Father    Cancer Father        throat   Healthy Brother    Drug abuse Brother    Healthy Son     Social History   Socioeconomic History    Marital status: Widowed    Spouse name: Not on file   Number of children: Not on file   Years of education: Not on file   Highest education level: GED or equivalent  Occupational History   Occupation: retired  Tobacco Use   Smoking status: Never   Smokeless tobacco: Never  Vaping Use   Vaping status: Never Used  Substance and Sexual Activity   Alcohol use: No   Drug use: No   Sexual activity: Not Currently  Other Topics Concern   Not on file  Social History Narrative   Patient is a 68 y/o widow (2011) who lives independently at home. 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 She has one son, a daughter-in-law and a grandson that live locally.  She struggles with her relationships with mother and brother.   Social Drivers of Corporate investment banker Strain: Low Risk  (03/16/2024)   Overall Financial Resource Strain (CARDIA)    Difficulty of Paying Living Expenses: Not hard at all  Food Insecurity: No Food Insecurity (03/16/2024)   Hunger Vital Sign    Worried About Running Out of Food in the Last Year: Never true    Ran Out of Food in the Last Year: Never true  Transportation Needs: No Transportation Needs (03/16/2024)   PRAPARE - Administrator, Civil Service (Medical): No    Lack of Transportation (Non-Medical): No  Physical Activity: Unknown (03/16/2024)   Exercise Vital Sign    Days of Exercise per Week: 0 days    Minutes of Exercise per Session: Not on file  Stress: Stress Concern Present (03/16/2024)   [employer] of Occupational Health - Occupational Stress Questionnaire    Feeling of Stress : Rather much  Social Connections: Socially Isolated (03/16/2024)   Social Connection and Isolation Panel    Frequency of Communication with Friends and Family: Once a week    Frequency of Social Gatherings with Friends and Family: Once a week    Attends Religious Services: Never    Database administrator or Organizations: No     Attends Engineer, structural: Not on file    Marital Status: Widowed  Intimate Partner Violence: Not At Risk (02/16/2023)   Humiliation, Afraid, Rape, and Kick questionnaire    Fear of Current or Ex-Partner: No    Emotionally Abused: No    Physically Abused: No    Sexually Abused: No     Constitutional: Denies fever, malaise, fatigue, headache or abrupt weight changes.  Respiratory: Denies difficulty breathing, shortness of breath, cough or sputum production.   Cardiovascular: Denies chest pain, chest tightness, palpitations  or swelling in the hands or feet.  Gastrointestinal: Patient reports LLQ pressure, nausea.  Denies bloating, constipation, diarrhea or blood in the stool.  GU: Pt reports burning with urination. Denies urgency, frequency, pain with urination, burning sensation, blood in urine, odor or discharge. Musculoskeletal: Patient reports left hip pain.  Denies decrease in range of motion, difficulty with gait, muscle pain or joint swelling.   No other specific complaints in a complete review of systems (except as listed in HPI above).     Objective:   Physical Exam  BP (!) 160/83 (BP Location: Left Wrist, Patient Position: Sitting, Cuff Size: Normal)   Ht 5' 2 (1.575 m)   Wt 266 lb (120.7 kg)   BMI 48.65 kg/m      Wt Readings from Last 3 Encounters:  04/01/24 266 lb 6.4 oz (120.8 kg)  03/17/24 266 lb 4 oz (120.8 kg)  03/04/24 267 lb (121.1 kg)    General: Appears her stated age, obese, in NAD. Cardiovascular: Normal rate and rhythm.  Pulmonary/Chest: Normal effort and positive vesicular breath sounds. No respiratory distress. No wheezes, rales or ronchi noted.  Abdomen: Soft, nontender over the bladder.  Normal bowel sounds.  No CVA tenderness noted. MSK: She has difficulty getting from sitting to a standing position.  Gait slow and steady without device. Neurological: Alert and oriented.   BMET    Component Value Date/Time   NA 136 03/17/2024 1505    NA 138 11/14/2019 1451   NA 140 01/17/2015 1431   K 3.8 03/17/2024 1505   K 4.5 01/17/2015 1431   CL 102 03/17/2024 1505   CL 104 01/17/2015 1431   CO2 21 03/17/2024 1505   CO2 29 01/17/2015 1431   GLUCOSE 173 (H) 03/17/2024 1505   GLUCOSE 107 (H) 01/17/2015 1431   BUN 10 03/17/2024 1505   BUN 13 11/14/2019 1451   BUN 13 01/17/2015 1431   CREATININE 0.93 03/17/2024 1505   CALCIUM  9.3 03/17/2024 1505   CALCIUM  10.2 01/17/2015 1431   GFRNONAA >60 03/19/2020 0022   GFRNONAA >60 01/17/2015 1431   GFRAA >60 03/19/2020 0022   GFRAA >60 01/17/2015 1431    Lipid Panel     Component Value Date/Time   CHOL 134 03/17/2024 1505   TRIG 135 03/17/2024 1505   HDL 59 03/17/2024 1505   CHOLHDL 2.3 03/17/2024 1505   VLDL 31 (H) 05/31/2017 0837   LDLCALC 53 03/17/2024 1505    CBC    Component Value Date/Time   WBC 10.4 03/17/2024 1505   RBC 5.02 03/17/2024 1505   HGB 15.4 03/17/2024 1505   HGB 15.3 11/14/2019 1451   HCT 45.8 (H) 03/17/2024 1505   HCT 44.6 11/14/2019 1451   PLT 298 03/17/2024 1505   PLT 343 11/14/2019 1451   MCV 91.2 03/17/2024 1505   MCV 88 11/14/2019 1451   MCV 89 01/17/2015 1431   MCH 30.7 03/17/2024 1505   MCHC 33.6 03/17/2024 1505   RDW 13.0 03/17/2024 1505   RDW 13.3 11/14/2019 1451   RDW 13.5 01/17/2015 1431   LYMPHSABS 1.9 11/14/2019 1451   MONOABS 528 05/31/2017 0837   EOSABS 0.2 11/14/2019 1451   BASOSABS 0.0 11/14/2019 1451    Hgb A1C Lab Results  Component Value Date   HGBA1C 6.0 (H) 03/17/2024           Assessment & Plan:   Assessment and Plan    Urinary Tract Infection (UTI) Recurrent UTI with persistent leukocytes and trace blood in  urinalysis. E. coli previously cultured. Septra allergy limits antibiotic options. Cipro  chosen as alternative. - Prescribed Ciprofloxacin  500 mg twice daily for 5 days. - Sent urine culture to confirm current infection and guide further treatment. - Consider urology referral if UTI  persists.  Hypertension Elevated blood pressure readings for over a month, worsened today. Discussed long-term risks of untreated hypertension. Patient prefers to delay medication until after surgical consultation. - Monitor blood pressure until after surgical consultation on July 10th. - Advised dietary modifications to reduce sodium intake. - Encouraged increased water intake and regular exercise.  Left Hip Pain: Has upcoming appointment with orthopedics    RTC in 6 months, follow-up chronic conditions Angeline Laura, NP

## 2024-04-11 NOTE — Telephone Encounter (Signed)
 FYI Only or Action Required?: Action required by provider: medication refill request, clinical question for provider, update on patient condition, and lab or test result follow-up needed.    Patient was last seen in primary care on 04/01/2024 by Antonette Angeline ORN, NP. Called Nurse Triage reporting Dysuria. Symptoms began yesterday. Interventions attempted: OTC medications: tylenol . Symptoms are: gradually worsening.  Triage Disposition: See Physician Within 24 Hours  Patient/caregiver understands and will follow disposition?: No, wishes to speak with PCP   Pt is requesting to come in a leave a sample as no appt available until Monday and her symptoms had completely resolved on Wednesday and then yesterday they came back even worse. States she completed all the Macrobid .                     Copied from CRM [id number]. Topic: Clinical - Red Word Triage >> Apr 11, 2024  8:03 AM Montie POUR wrote: Red Word that prompted transfer to Nurse Triage:  She is having pressure when going to bathroom, pain, burning, Symptoms is worse. Reason for Disposition  > 2 UTI's in last year  Answer Assessment - Initial Assessment Questions 1. SEVERITY: How bad is the pain?  (e.g., Scale 1-10; mild, moderate, or severe)   - MILD (1-3): complains slightly about urination hurting   - MODERATE (4-7): interferes with normal activities     - SEVERE (8-10): excruciating, unwilling or unable to urinate because of the pain      10/10 when stream starts then eases up a little 2. FREQUENCY: How many times have you had painful urination today?      Every time 3. PATTERN: Is pain present every time you urinate or just sometimes?      Every time 4. ONSET: When did the painful urination start?      yesterday 5. FEVER: Do you have a fever? If Yes, ask: What is your temperature, how was it measured, and when did it start?     no 6. PAST UTI: Have you had a urine infection before? If Yes, ask: When  was the last time? and What happened that time?      A week ago 7. CAUSE: What do you think is causing the painful urination?  (e.g., UTI, scratch, Herpes sore)     uti 8. OTHER SYMPTOMS: Do you have any other symptoms? (e.g., blood in urine, flank pain, genital sores, urgency, vaginal discharge)      pressure  Protocols used: Urination Pain - Female-A-AH

## 2024-04-11 NOTE — Patient Instructions (Signed)
 Hypertension, Adult Hypertension is another name for high blood pressure. High blood pressure forces your heart to work harder to pump blood. This can cause problems over time. There are two numbers in a blood pressure reading. There is a top number (systolic) over a bottom number (diastolic). It is best to have a blood pressure that is below 120/80. What are the causes? The cause of this condition is not known. Some other conditions can lead to high blood pressure. What increases the risk? Some lifestyle factors can make you more likely to develop high blood pressure: Smoking. Not getting enough exercise or physical activity. Being overweight. Having too much fat, sugar, calories, or salt (sodium) in your diet. Drinking too much alcohol. Other risk factors include: Having any of these conditions: Heart disease. Diabetes. High cholesterol. Kidney disease. Obstructive sleep apnea. Having a family history of high blood pressure and high cholesterol. Age. The risk increases with age. Stress. What are the signs or symptoms? High blood pressure may not cause symptoms. Very high blood pressure (hypertensive crisis) may cause: Headache. Fast or uneven heartbeats (palpitations). Shortness of breath. Nosebleed. Vomiting or feeling like you may vomit (nauseous). Changes in how you see. Very bad chest pain. Feeling dizzy. Seizures. How is this treated? This condition is treated by making healthy lifestyle changes, such as: Eating healthy foods. Exercising more. Drinking less alcohol. Your doctor may prescribe medicine if lifestyle changes do not help enough and if: Your top number is above 130. Your bottom number is above 80. Your personal target blood pressure may vary. Follow these instructions at home: Eating and drinking  If told, follow the DASH eating plan. To follow this plan: Fill one half of your plate at each meal with fruits and vegetables. Fill one fourth of your plate  at each meal with whole grains. Whole grains include whole-wheat pasta, brown rice, and whole-grain bread. Eat or drink low-fat dairy products, such as skim milk or low-fat yogurt. Fill one fourth of your plate at each meal with low-fat (lean) proteins. Low-fat proteins include fish, chicken without skin, eggs, beans, and tofu. Avoid fatty meat, cured and processed meat, or chicken with skin. Avoid pre-made or processed food. Limit the amount of salt in your diet to less than 1,500 mg each day. Do not drink alcohol if: Your doctor tells you not to drink. You are pregnant, may be pregnant, or are planning to become pregnant. If you drink alcohol: Limit how much you have to: 0-1 drink a day for women. 0-2 drinks a day for men. Know how much alcohol is in your drink. In the [place] one drink equals one 12 oz bottle of beer (355 mL), one 5 oz glass of wine (148 mL), or one 1 oz glass of hard liquor (44 mL). Lifestyle  Work with your doctor to stay at a healthy weight or to lose weight. Ask your doctor what the best weight is for you. Get at least 30 minutes of exercise that causes your heart to beat faster (aerobic exercise) most days of the week. This may include walking, swimming, or biking. Get at least 30 minutes of exercise that strengthens your muscles (resistance exercise) at least 3 days a week. This may include lifting weights or doing Pilates. Do not smoke or use any products that contain nicotine or tobacco. If you need help quitting, ask your doctor. Check your blood pressure at home as told by your doctor. Keep all follow-up visits. Medicines Take over-the-counter and prescription medicines  only as told by your doctor. Follow directions carefully. Do not skip doses of blood pressure medicine. The medicine does not work as well if you skip doses. Skipping doses also puts you at risk for problems. Ask your doctor about side effects or reactions to medicines that you should watch  for. Contact a doctor if: You think you are having a reaction to the medicine you are taking. You have headaches that keep coming back. You feel dizzy. You have swelling in your ankles. You have trouble with your vision. Get help right away if: You get a very bad headache. You start to feel mixed up (confused). You feel weak or numb. You feel faint. You have very bad pain in your: Chest. Belly (abdomen). You vomit more than once. You have trouble breathing. These symptoms may be an emergency. Get help right away. Call 911. Do not wait to see if the symptoms will go away. Do not drive yourself to the hospital. Summary Hypertension is another name for high blood pressure. High blood pressure forces your heart to work harder to pump blood. For most people, a normal blood pressure is less than 120/80. Making healthy choices can help lower blood pressure. If your blood pressure does not get lower with healthy choices, you may need to take medicine. This information is not intended to replace advice given to you by your health care provider. Make sure you discuss any questions you have with your health care provider. Document Revised: 07/21/2021 Document Reviewed: 07/21/2021 Elsevier Patient Education  2024 [employer].

## 2024-04-11 NOTE — Telephone Encounter (Signed)
 Will discuss at upcoming appointment

## 2024-04-15 ENCOUNTER — Telehealth: Payer: Self-pay

## 2024-04-15 ENCOUNTER — Ambulatory Visit: Payer: Self-pay | Admitting: Internal Medicine

## 2024-04-15 LAB — URINE CULTURE
MICRO NUMBER:: 16634773
SPECIMEN QUALITY:: ADEQUATE

## 2024-04-15 NOTE — Telephone Encounter (Signed)
 Copied from CRM [id number]. Topic: Clinical - Lab/Test Results >> Apr 15, 2024 10:22 AM Wess RAMAN wrote: Reason for CRM: Patient would like a call back from Larinda Alan HERO, CMA to speak about lab results. Advised patient of her lab results.  Callback #: [phone]

## 2024-04-24 ENCOUNTER — Ambulatory Visit: Payer: Self-pay | Admitting: Internal Medicine

## 2024-04-24 DIAGNOSIS — M1612 Unilateral primary osteoarthritis, left hip: Secondary | ICD-10-CM | POA: Diagnosis not present

## 2024-04-24 DIAGNOSIS — M7062 Trochanteric bursitis, left hip: Secondary | ICD-10-CM | POA: Diagnosis not present

## 2024-04-24 DIAGNOSIS — G8929 Other chronic pain: Secondary | ICD-10-CM | POA: Diagnosis not present

## 2024-04-25 ENCOUNTER — Telehealth: Payer: Self-pay

## 2024-04-25 MED ORDER — OLMESARTAN MEDOXOMIL 20 MG PO TABS: 10.0000 mg | ORAL_TABLET | Freq: Every day | ORAL | 0 refills | Status: DC

## 2024-04-25 NOTE — Telephone Encounter (Signed)
 Copied from CRM [id number]. Topic: General - Other >> Apr 25, 2024 10:22 AM Avram MATSU wrote: Reason for CRM: patient stated she had an appt yesterday with ortho and stated her blood pressure is 142/94. She wanted to let her provider know.

## 2024-04-25 NOTE — Telephone Encounter (Signed)
 Medication sent to pharmacy

## 2024-04-25 NOTE — Addendum Note (Signed)
 Addended by: ANTONETTE ANGELINE ORN on: 04/25/2024 11:34 AM   Modules accepted: Orders

## 2024-04-25 NOTE — Telephone Encounter (Signed)
 BP remains elevated. I would recommend antihypertensive medication at this time. Please let me know if she is agreeable and I can send in medication. I would need to see her in 2 weeks for followup of HTN.

## 2024-04-25 NOTE — Telephone Encounter (Signed)
 Agreeable to BP medication [facility]  Also appointment made for 7/28

## 2024-05-01 ENCOUNTER — Ambulatory Visit

## 2024-05-01 NOTE — Progress Notes (Signed)
 Patient presents to the office today requesting blood pressure check using her device she received from [facility].  Pressure checked using lower arm as the cuff was not large enough for patients arm.  Patient complained of some chest pain while in the office.  She says she has mitral valve disfunction and it was normal.  Advised her to seek medical attention if it worsens and follow up with cardiology.

## 2024-05-02 ENCOUNTER — Ambulatory Visit

## 2024-05-02 DIAGNOSIS — Z0000 Encounter for general adult medical examination without abnormal findings: Secondary | ICD-10-CM | POA: Diagnosis not present

## 2024-05-02 NOTE — Progress Notes (Signed)
 Subjective:   Denise Glass is a 68 y.o. who presents for a Medicare Wellness preventive visit.  As a reminder, Annual Wellness Visits don't include a physical exam, and some assessments may be limited, especially if this visit is performed virtually. We may recommend an in-person follow-up visit with your provider if needed.  Visit Complete: Virtual I connected with  Denise Glass on 05/02/24 by a audio enabled telemedicine application and verified that I am speaking with the correct person using two identifiers.  Patient Location: Home  Provider Location: Home Office  I discussed the limitations of evaluation and management by telemedicine. The patient expressed understanding and agreed to proceed.  Vital Signs: Because this visit was a virtual/telehealth visit, some criteria may be missing or patient reported. Any vitals not documented were not able to be obtained and vitals that have been documented are patient reported.  VideoDeclined- This patient declined Librarian, academic. Therefore the visit was completed with audio only.  Persons Participating in Visit: Patient.  AWV Questionnaire: No: Patient Medicare AWV questionnaire was not completed prior to this visit.  Cardiac Risk Factors include: advanced age (>71men, >104 women);hypertension;dyslipidemia;sedentary lifestyle;obesity (BMI >30kg/m2)     Objective:    There were no vitals filed for this visit. There is no height or weight on file to calculate BMI.     05/02/2024   10:21 AM 02/16/2023   11:07 AM 02/21/2022    9:03 AM 02/10/2022    1:58 PM 10/17/2020    4:46 PM 03/19/2020   12:20 AM 07/01/2019    4:25 PM  Advanced Directives  Does Patient Have a Medical Advance Directive? No No Yes No No No No  Type of Publishing rights manager of Healthcare Power of Attorney in Chart?   No - copy requested      Would patient like information on creating a medical advance  directive? No - Patient declined No - Patient declined No - Patient declined No - Patient declined   No - Patient declined    Current Medications (verified) Outpatient Encounter Medications as of 05/02/2024  Medication Sig   aspirin  EC 81 MG tablet Take 1 tablet (81 mg total) by mouth daily. Swallow whole.   atorvastatin  (LIPITOR) 20 MG tablet Take 1 tablet (20 mg total) by mouth daily.   diphenhydrAMINE  HCl (BENADRYL  ALLERGY PO) Take by mouth. (Patient taking differently: Take by mouth. TAKES PRN)   HYDROcodone-acetaminophen  (NORCO/VICODIN) 5-325 MG tablet Take 1 tablet by mouth 2 (two) times daily.   meclizine  (ANTIVERT ) 25 MG tablet Take 1 tablet (25 mg total) by mouth 3 (three) times daily as needed for dizziness.   olmesartan  (BENICAR ) 20 MG tablet Take 0.5 tablets (10 mg total) by mouth daily.   omeprazole (PRILOSEC) 20 MG capsule Take by mouth.   oxybutynin  (DITROPAN  XL) 15 MG 24 hr tablet TAKE 1 TABLET BY MOUTH EVERYDAY AT BEDTIME   zolpidem  (AMBIEN ) 10 MG tablet TAKE 1 TABLET (10 MG TOTAL) BY MOUTH AT BEDTIME AS NEEDED FOR SLEEP   dicyclomine  (BENTYL ) 10 MG capsule Take 1 capsule (10 mg total) by mouth 4 (four) times daily -  before meals and at bedtime. As needed (Patient not taking: Reported on 05/02/2024)   ibuprofen (ADVIL) 200 MG tablet Take by mouth. (Patient not taking: Reported on 05/02/2024)   nitrofurantoin , macrocrystal-monohydrate, (MACROBID ) 100 MG capsule Take 1 capsule (100 mg total) by mouth 2 (two)  times daily. (Patient not taking: Reported on 05/02/2024)   ondansetron  (ZOFRAN -ODT) 4 MG disintegrating tablet Take 1 tablet (4 mg total) by mouth every 8 (eight) hours as needed for nausea or vomiting.   No facility-administered encounter medications on file as of 05/02/2024.    Allergies (verified) Other, Prednisone, Sulfa antibiotics, and Prednazoline   History: Past Medical History:  Diagnosis Date   Complication of anesthesia    pt reports her appetite takes 2-3  weeks to come back after anesthesia.   Depression    DJD (degenerative joint disease)    GERD (gastroesophageal reflux disease)    Headache 2016   cluster head aches, Head aches have subsided.   Hypothyroidism    Insomnia    Mitral valve prolapse 2003   Followed by Dr. Florencio (prn)   PONV (postoperative nausea and vomiting) 2002   With BTL   Urine frequency    Past Surgical History:  Procedure Laterality Date   BREAST BIOPSY  2001   Calcification - bx done by Dr. DOROTHA Cota   BREAST SURGERY  2001   biopsy   COLONOSCOPY WITH PROPOFOL  N/A 11/13/2016   Procedure: COLONOSCOPY WITH PROPOFOL ;  Surgeon: Lamar ONEIDA Holmes, MD;  Location[facility] ENDOSCOPY;  Service: Endoscopy;  Laterality: N/A;   ESOPHAGOGASTRODUODENOSCOPY (EGD) WITH PROPOFOL  N/A 02/21/2022   Procedure: ESOPHAGOGASTRODUODENOSCOPY (EGD) WITH PROPOFOL ;  Surgeon: Maryruth Ole ONEIDA, MD;  Location: [facility] ENDOSCOPY;  Service: Endoscopy;  Laterality: N/A;   JOINT REPLACEMENT  2008   left and right knee replaced   KNEE ARTHROPLASTY  2007   Right Knee   KNEE ARTHROPLASTY  2008   Left Knee   left total knee replacement     POLYPECTOMY     colon   right total knee     TOTAL HIP ARTHROPLASTY Right 08/22/2017   Procedure: TOTAL HIP ARTHROPLASTY;  Surgeon: Mardee Lynwood SQUIBB, MD;  Location: [facility] ORS;  Service: Orthopedics;  Laterality: Right;   TUBAL LIGATION     Family History  Problem Relation Age of Onset   COPD Mother    Heart disease Mother    Stroke Mother    Heart disease Father    COPD Father    Cancer Father        throat   Healthy Brother    Drug abuse Brother    Healthy Son    Social History   Socioeconomic History   Marital status: Widowed    Spouse name: Not on file   Number of children: Not on file   Years of education: Not on file   Highest education level: GED or equivalent  Occupational History   Occupation: retired  Tobacco Use   Smoking status: Never   Smokeless tobacco: Never  Vaping Use    Vaping status: Never Used  Substance and Sexual Activity   Alcohol use: No   Drug use: No   Sexual activity: Not Currently  Other Topics Concern   Not on file  Social History Narrative   Patient is a 68 y/o widow (2011) who lives independently at home. 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 She has one son, a daughter-in-law and a grandson that live locally.  She struggles with her relationships with mother and brother.   Social Drivers of Corporate investment banker Strain: Low Risk  (05/02/2024)   Overall Financial Resource Strain (CARDIA)    Difficulty of Paying Living Expenses: Not hard at all  Food Insecurity: No  Food Insecurity (05/02/2024)   Hunger Vital Sign    Worried About Running Out of Food in the Last Year: Never true    Ran Out of Food in the Last Year: Never true  Transportation Needs: No Transportation Needs (05/02/2024)   PRAPARE - Administrator, Civil Service (Medical): No    Lack of Transportation (Non-Medical): No  Physical Activity: Inactive (05/02/2024)   Exercise Vital Sign    Days of Exercise per Week: 0 days    Minutes of Exercise per Session: 0 min  Stress: No Stress Concern Present (05/02/2024)   [employer] of Occupational Health - Occupational Stress Questionnaire    Feeling of Stress: Not at all  Recent Concern: Stress - Stress Concern Present (03/16/2024)   [employer] of Occupational Health - Occupational Stress Questionnaire    Feeling of Stress : Rather much  Social Connections: Moderately Isolated (05/02/2024)   Social Connection and Isolation Panel    Frequency of Communication with Friends and Family: More than three times a week    Frequency of Social Gatherings with Friends and Family: Never    Attends Religious Services: More than 4 times per year    Active Member of [employer] or Organizations: No    Attends Banker Meetings: Never    Marital Status: Widowed    Tobacco  Counseling Counseling given: Not Answered    Clinical Intake:  Pre-visit preparation completed: Yes  Pain : No/denies pain     BMI - recorded: 48.7 Nutritional Status: BMI > 30  Obese Nutritional Risks: None Diabetes: No  Lab Results  Component Value Date   HGBA1C 6.0 (H) 03/17/2024   HGBA1C 6.1 (H) 10/25/2023   HGBA1C 6.0 (H) 08/14/2022     How often do you need to have someone help you when you read instructions, pamphlets, or other written materials from your doctor or pharmacy?: 1 - Never  Interpreter Needed?: No  Information entered by :: JHONNIE DAS, LPN   Activities of Daily Living     05/02/2024   10:22 AM  In your present state of health, do you have any difficulty performing the following activities:  Hearing? 0  Vision? 0  Difficulty concentrating or making decisions? 0  Walking or climbing stairs? 1  Comment HIP PAIN  Dressing or bathing? 0  Doing errands, shopping? 0  Preparing Food and eating ? N  Using the Toilet? N  In the past six months, have you accidently leaked urine? N  Do you have problems with loss of bowel control? N  Managing your Medications? N  Managing your Finances? N  Housekeeping or managing your Housekeeping? N    Patient Care Team: Antonette Angeline ORN, NP as PCP - General (Internal Medicine) Perla Evalene PARAS, MD as PCP - Cardiology (Cardiology) Pa, [facility]  I have updated your Care Teams any recent Medical Services you may have received from other providers in the past year.     Assessment:   This is a routine wellness examination for Kim.  Hearing/Vision screen Hearing Screening - Comments:: NO AIDS Vision Screening - Comments:: WEARS GLASSES ALL DAY- PATTY VISION   Goals Addressed             This Visit's Progress    DIET - REDUCE SUGAR INTAKE         Depression Screen     05/02/2024   10:19 AM 03/04/2024   11:19 AM 10/25/2023    1:16 PM  02/16/2023   11:05 AM 01/04/2023    3:10 PM  08/14/2022    2:49 PM 07/13/2022    9:44 AM  PHQ 2/9 Scores  PHQ - 2 Score 2 6 6 1 3 5 6   PHQ- 9 Score 3 23 15 1 11 19 16     Fall Risk     05/02/2024   10:22 AM 04/01/2024   10:04 AM 03/04/2024   11:19 AM 10/25/2023    1:16 PM 02/16/2023   11:07 AM  Fall Risk   Falls in the past year? 0 0 0 1 0  Number falls in past yr: 0   0   Injury with Fall? 0      Risk for fall due to : No Fall Risks      Follow up Falls evaluation completed;Falls prevention discussed    Falls evaluation completed;Falls prevention discussed    MEDICARE RISK AT HOME:  Medicare Risk at Home Any stairs in or around the home?: Yes If so, are there any without handrails?: No Home free of loose throw rugs in walkways, pet beds, electrical cords, etc?: Yes Adequate lighting in your home to reduce risk of falls?: Yes Life alert?: No Use of a cane, walker or w/c?: No Grab bars in the bathroom?: Yes Shower chair or bench in shower?: No Elevated toilet seat or a handicapped toilet?: No  TIMED UP AND GO:  Was the test performed?  No  Cognitive Function: 6CIT completed    03/05/2020    3:00 PM  MMSE - Mini Mental State Exam  Orientation to time 5  Orientation to Place 5  Registration 3  Attention/ Calculation 5  Recall 2  Language- name 2 objects 2  Language- repeat 1  Language- follow 3 step command 3  Language- read & follow direction 1  Write a sentence 1  Copy design 1  Total score 29        05/02/2024   10:24 AM 02/16/2023   11:11 AM  6CIT Screen  What Year? 0 points 0 points  What month? 0 points 0 points  What time? 0 points 0 points  Count back from 20 0 points 0 points  Months in reverse 0 points 0 points  Repeat phrase 0 points 0 points  Total Score 0 points 0 points    Immunizations Immunization History  Administered Date(s) Administered   Fluad Quad(high Dose 65+) 08/16/2021, 08/14/2022   Influenza,inj,Quad PF,6+ Mos 08/25/2019, 07/07/2020   Janssen (J&J) SARS-COV-2 Vaccination  08/16/2020   PNEUMOCOCCAL CONJUGATE-20 08/14/2022   Pneumococcal Polysaccharide-23 07/07/2020   Tdap 09/23/2012    Screening Tests Health Maintenance  Topic Date Due   COVID-19 Vaccine (2 - Janssen risk series) 09/13/2020   Zoster Vaccines- Shingrix (1 of 2) 06/17/2024 (Originally 11/08/1974)   DTaP/Tdap/Td (2 - Td or Tdap) 03/17/2025 (Originally 09/23/2022)   MAMMOGRAM  03/17/2025 (Originally 07/04/2017)   DEXA SCAN  03/17/2025 (Originally 11/08/2020)   INFLUENZA VACCINE  05/16/2024   Medicare Annual Wellness (AWV)  05/02/2025   Colonoscopy  11/13/2026   Pneumococcal Vaccine: 50+ Years  Completed   Hepatitis C Screening  Completed   Hepatitis B Vaccines  Aged Out   HPV VACCINES  Aged Out   Meningococcal B Vaccine  Aged Out    Health Maintenance  Health Maintenance Due  Topic Date Due   COVID-19 Vaccine (2 - Janssen risk series) 09/13/2020   Health Maintenance Items Addressed: COLONOSCOPY UP TO DATE; DECLINES MAMMOGRAM & BDS; UP TO DATE  W/ PNA- NEED TDAP, COVID, SHINGRIX  Additional Screening:  Vision Screening: Recommended annual ophthalmology exams for early detection of glaucoma and other disorders of the eye. Would you like a referral to an eye doctor? No    Dental Screening: Recommended annual dental exams for proper oral hygiene  Community Resource Referral / Chronic Care Management: CRR required this visit?  No   CCM required this visit?  No   Plan:    I have personally reviewed and noted the following in the patient's chart:   Medical and social history Use of alcohol, tobacco or illicit drugs  Current medications and supplements including opioid prescriptions. Patient is currently taking opioid prescriptions. Information provided to patient regarding non-opioid alternatives. Patient advised to discuss non-opioid treatment plan with their provider. Functional ability and status Nutritional status Physical activity Advanced directives List of other  physicians Hospitalizations, surgeries, and ER visits in previous 12 months Vitals Screenings to include cognitive, depression, and falls Referrals and appointments  In addition, I have reviewed and discussed with patient certain preventive protocols, quality metrics, and best practice recommendations. A written personalized care plan for preventive services as well as general preventive health recommendations were provided to patient.   Jhonnie GORMAN Das, LPN   [id number]/81/7974   After Visit Summary: (MyChart) Due to this being a telephonic visit, the after visit summary with patients personalized plan was offered to patient via MyChart   Notes: Nothing significant to report at this time.

## 2024-05-02 NOTE — Patient Instructions (Signed)
 Denise Glass , Thank you for taking time out of your busy schedule to complete your Annual Wellness Visit with me. I enjoyed our conversation and look forward to speaking with you again next year. I, as well as your care team,  appreciate your ongoing commitment to your health goals. Please review the following plan we discussed and let me know if I can assist you in the future.   Follow up Visits: Next Medicare AWV with our clinical staff:   05/08/25 @ 9:30 AM BY PHONE Have you seen your provider in the last 6 months (3 months if uncontrolled diabetes)? Yes  Clinician Recommendations:  Aim for 30 minutes of exercise or brisk walking, 6-8 glasses of water, and 5 servings of fruits and vegetables each day. TAKE CARE!      This is a list of the screening recommended for you and due dates:  Health Maintenance  Topic Date Due   COVID-19 Vaccine (2 - Janssen risk series) 09/13/2020   Zoster (Shingles) Vaccine (1 of 2) 06/17/2024*   DTaP/Tdap/Td vaccine (2 - Td or Tdap) 03/17/2025*   Mammogram  03/17/2025*   DEXA scan (bone density measurement)  03/17/2025*   Flu Shot  05/16/2024   Medicare Annual Wellness Visit  05/02/2025   Colon Cancer Screening  11/13/2026   Pneumococcal Vaccine for age over 93  Completed   Hepatitis C Screening  Completed   Hepatitis B Vaccine  Aged Out   HPV Vaccine  Aged Out   Meningitis B Vaccine  Aged Out  *Topic was postponed. The date shown is not the original due date.    Advanced directives: (ACP Link)Information on Advanced Care Planning can be found at [place]  Secretary of [facility] Advance Health Care Directives Advance Health Care Directives. [email or web address]  Advance Care Planning is important because it:  [x]  Makes sure you receive the medical care that is consistent with your values, goals, and preferences  [x]  It provides guidance to your family and loved ones and reduces their decisional burden about whether or not they are making the right decisions based  on your wishes.  Follow the link provided in your after visit summary or read over the paperwork we have mailed to you to help you started getting your Advance Directives in place. If you need assistance in completing these, please reach out to us  so that we can help you!

## 2024-05-12 ENCOUNTER — Ambulatory Visit (INDEPENDENT_AMBULATORY_CARE_PROVIDER_SITE_OTHER): Admitting: Internal Medicine

## 2024-05-12 ENCOUNTER — Encounter: Payer: Self-pay | Admitting: Internal Medicine

## 2024-05-12 VITALS — BP 124/84 | Ht 62.0 in | Wt 265.2 lb

## 2024-05-12 DIAGNOSIS — G8929 Other chronic pain: Secondary | ICD-10-CM

## 2024-05-12 DIAGNOSIS — E66813 Obesity, class 3: Secondary | ICD-10-CM | POA: Diagnosis not present

## 2024-05-12 DIAGNOSIS — Z6841 Body Mass Index (BMI) 40.0 and over, adult: Secondary | ICD-10-CM | POA: Diagnosis not present

## 2024-05-12 DIAGNOSIS — M25552 Pain in left hip: Secondary | ICD-10-CM

## 2024-05-12 DIAGNOSIS — I10 Essential (primary) hypertension: Secondary | ICD-10-CM

## 2024-05-12 MED ORDER — OLMESARTAN MEDOXOMIL 20 MG PO TABS: 10.0000 mg | ORAL_TABLET | Freq: Every day | ORAL | 1 refills | Status: AC

## 2024-05-12 NOTE — Progress Notes (Signed)
 Subjective:    Patient ID: Denise Glass, female    DOB: [birth year], 68 y.o.   MRN: [id number]  HPI  Discussed the use of AI scribe software for clinical note transcription with the patient, who gave verbal consent to proceed.  Denise Glass is a 68 year old female with hypertension who presents for a blood pressure follow-up.  She has been taking olmesartan , initially prescribed at 20 mg, but she is currently taking half a tablet, equating to 10 mg daily. Her most recent blood pressure reading was 124/84 mmHg.  She experiences chronic left hip pain, which was initially thought to be contributing to her elevated blood pressure. She received a cortisone injection in her hip, which provided significant pain relief. The injection was administered in three different places in her hip.     Review of Systems  Past Medical History:  Diagnosis Date   Complication of anesthesia    pt reports her appetite takes 2-3 weeks to come back after anesthesia.   Depression    DJD (degenerative joint disease)    GERD (gastroesophageal reflux disease)    Headache 2016   cluster head aches, Head aches have subsided.   Hypothyroidism    Insomnia    Mitral valve prolapse 2003   Followed by Dr. Florencio (prn)   PONV (postoperative nausea and vomiting) 2002   With BTL   Urine frequency     Current Outpatient Medications  Medication Sig Dispense Refill   aspirin  EC 81 MG tablet Take 1 tablet (81 mg total) by mouth daily. Swallow whole.     atorvastatin  (LIPITOR) 20 MG tablet Take 1 tablet (20 mg total) by mouth daily. 90 tablet 3   dicyclomine  (BENTYL ) 10 MG capsule Take 1 capsule (10 mg total) by mouth 4 (four) times daily -  before meals and at bedtime. As needed (Patient not taking: Reported on 05/02/2024) 30 capsule 0   diphenhydrAMINE  HCl (BENADRYL  ALLERGY PO) Take by mouth. (Patient taking differently: Take by mouth. TAKES PRN)     HYDROcodone-acetaminophen  (NORCO/VICODIN) 5-325 MG tablet Take  1 tablet by mouth 2 (two) times daily.     ibuprofen (ADVIL) 200 MG tablet Take by mouth. (Patient not taking: Reported on 05/02/2024)     meclizine  (ANTIVERT ) 25 MG tablet Take 1 tablet (25 mg total) by mouth 3 (three) times daily as needed for dizziness. 30 tablet 1   nitrofurantoin , macrocrystal-monohydrate, (MACROBID ) 100 MG capsule Take 1 capsule (100 mg total) by mouth 2 (two) times daily. (Patient not taking: Reported on 05/02/2024) 10 capsule 0   olmesartan  (BENICAR ) 20 MG tablet Take 0.5 tablets (10 mg total) by mouth daily. 15 tablet 0   omeprazole (PRILOSEC) 20 MG capsule Take by mouth.     ondansetron  (ZOFRAN -ODT) 4 MG disintegrating tablet Take 1 tablet (4 mg total) by mouth every 8 (eight) hours as needed for nausea or vomiting. 30 tablet 0   oxybutynin  (DITROPAN  XL) 15 MG 24 hr tablet TAKE 1 TABLET BY MOUTH EVERYDAY AT BEDTIME 90 tablet 1   zolpidem  (AMBIEN ) 10 MG tablet TAKE 1 TABLET (10 MG TOTAL) BY MOUTH AT BEDTIME AS NEEDED FOR SLEEP 30 tablet 0   No current facility-administered medications for this visit.    Allergies  Allergen Reactions   Other Other (See Comments)   Prednisone Other (See Comments)    Mouth sores and thrush    Sulfa Antibiotics Other (See Comments) and Nausea And Vomiting    Unknown Stevens-Johnson Syndrome (  affecting mucous membranes)   Prednazoline Other (See Comments)    Family History  Problem Relation Age of Onset   COPD Mother    Heart disease Mother    Stroke Mother    Heart disease Father    COPD Father    Cancer Father        throat   Healthy Brother    Drug abuse Brother    Healthy Son     Social History   Socioeconomic History   Marital status: Widowed    Spouse name: Not on file   Number of children: Not on file   Years of education: Not on file   Highest education level: GED or equivalent  Occupational History   Occupation: retired  Tobacco Use   Smoking status: Never   Smokeless tobacco: Never  Vaping Use   Vaping  status: Never Used  Substance and Sexual Activity   Alcohol use: No   Drug use: No   Sexual activity: Not Currently  Other Topics Concern   Not on file  Social History Narrative   Patient is a 68 y/o widow (2011) who lives independently at home. 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 She has one son, a daughter-in-law and a grandson that live locally.  She struggles with her relationships with mother and brother.   Social Drivers of Corporate investment banker Strain: Low Risk  (05/02/2024)   Overall Financial Resource Strain (CARDIA)    Difficulty of Paying Living Expenses: Not hard at all  Food Insecurity: No Food Insecurity (05/02/2024)   Hunger Vital Sign    Worried About Running Out of Food in the Last Year: Never true    Ran Out of Food in the Last Year: Never true  Transportation Needs: No Transportation Needs (05/02/2024)   PRAPARE - Administrator, Civil Service (Medical): No    Lack of Transportation (Non-Medical): No  Physical Activity: Inactive (05/02/2024)   Exercise Vital Sign    Days of Exercise per Week: 0 days    Minutes of Exercise per Session: 0 min  Stress: No Stress Concern Present (05/02/2024)   [employer] of Occupational Health - Occupational Stress Questionnaire    Feeling of Stress: Not at all  Recent Concern: Stress - Stress Concern Present (03/16/2024)   [employer] of Occupational Health - Occupational Stress Questionnaire    Feeling of Stress : Rather much  Social Connections: Moderately Isolated (05/02/2024)   Social Connection and Isolation Panel    Frequency of Communication with Friends and Family: More than three times a week    Frequency of Social Gatherings with Friends and Family: Never    Attends Religious Services: More than 4 times per year    Active Member of [employer] or Organizations: No    Attends Banker Meetings: Never    Marital Status: Widowed  Intimate Partner  Violence: Not At Risk (05/02/2024)   Humiliation, Afraid, Rape, and Kick questionnaire    Fear of Current or Ex-Partner: No    Emotionally Abused: No    Physically Abused: No    Sexually Abused: No     Constitutional: Denies fever, malaise, fatigue, headache or abrupt weight changes.  Respiratory: Denies difficulty breathing, shortness of breath, cough or sputum production.   Cardiovascular: Denies chest pain, chest tightness, palpitations or swelling in the hands or feet.  Musculoskeletal: Patient reports left hip pain.  Denies decrease in range of motion, difficulty  with gait, muscle pain or joint swelling.   No other specific complaints in a complete review of systems (except as listed in HPI above).     Objective:   Physical Exam BP 124/84 (BP Location: Left Arm, Patient Position: Sitting, Cuff Size: Large)   Ht 5' 2 (1.575 m)   Wt 265 lb 3.2 oz (120.3 kg)   BMI 48.51 kg/m       Wt Readings from Last 3 Encounters:  04/11/24 266 lb (120.7 kg)  04/01/24 266 lb 6.4 oz (120.8 kg)  03/17/24 266 lb 4 oz (120.8 kg)    General: Appears her stated age, obese, in NAD. Cardiovascular: Normal rate and rhythm.  Pulmonary/Chest: Normal effort and positive vesicular breath sounds. No respiratory distress. No wheezes, rales or ronchi noted.  MSK: Gait slow and steady without device. Neurological: Alert and oriented.   BMET    Component Value Date/Time   NA 136 03/17/2024 1505   NA 138 11/14/2019 1451   NA 140 01/17/2015 1431   K 3.8 03/17/2024 1505   K 4.5 01/17/2015 1431   CL 102 03/17/2024 1505   CL 104 01/17/2015 1431   CO2 21 03/17/2024 1505   CO2 29 01/17/2015 1431   GLUCOSE 173 (H) 03/17/2024 1505   GLUCOSE 107 (H) 01/17/2015 1431   BUN 10 03/17/2024 1505   BUN 13 11/14/2019 1451   BUN 13 01/17/2015 1431   CREATININE 0.93 03/17/2024 1505   CALCIUM  9.3 03/17/2024 1505   CALCIUM  10.2 01/17/2015 1431   GFRNONAA >60 03/19/2020 0022   GFRNONAA >60 01/17/2015 1431    GFRAA >60 03/19/2020 0022   GFRAA >60 01/17/2015 1431    Lipid Panel     Component Value Date/Time   CHOL 134 03/17/2024 1505   TRIG 135 03/17/2024 1505   HDL 59 03/17/2024 1505   CHOLHDL 2.3 03/17/2024 1505   VLDL 31 (H) 05/31/2017 0837   LDLCALC 53 03/17/2024 1505    CBC    Component Value Date/Time   WBC 10.4 03/17/2024 1505   RBC 5.02 03/17/2024 1505   HGB 15.4 03/17/2024 1505   HGB 15.3 11/14/2019 1451   HCT 45.8 (H) 03/17/2024 1505   HCT 44.6 11/14/2019 1451   PLT 298 03/17/2024 1505   PLT 343 11/14/2019 1451   MCV 91.2 03/17/2024 1505   MCV 88 11/14/2019 1451   MCV 89 01/17/2015 1431   MCH 30.7 03/17/2024 1505   MCHC 33.6 03/17/2024 1505   RDW 13.0 03/17/2024 1505   RDW 13.3 11/14/2019 1451   RDW 13.5 01/17/2015 1431   LYMPHSABS 1.9 11/14/2019 1451   MONOABS 528 05/31/2017 0837   EOSABS 0.2 11/14/2019 1451   BASOSABS 0.0 11/14/2019 1451    Hgb A1C Lab Results  Component Value Date   HGBA1C 6.0 (H) 03/17/2024           Assessment & Plan:   Assessment and Plan    Left Hip Osteoarthritis Confirmed by imaging. Cortisone injection provided relief. Surgery considered but postponed until after the new year. - Discuss potential hip surgery with orthopedic surgeon after the new year.  Hypertension Managed with olmesartan  10 mg daily. Current BP 124/84 mmHg. Management satisfactory, no dose increase to avoid side effects. - Refill olmesartan  10 mg for six months. - Advise reducing dietary salt intake. - Encourage aerobic exercise, acknowledging hip pain challenge.      RTC in 5 months, follow-up chronic conditions Angeline Laura, NP

## 2024-05-12 NOTE — Patient Instructions (Signed)

## 2024-05-15 ENCOUNTER — Emergency Department
Admission: EM | Admit: 2024-05-15 | Discharge: 2024-05-15 | Disposition: A | Discharge status: Home / Self Care | Attending: Emergency Medicine | Admitting: Emergency Medicine

## 2024-05-15 ENCOUNTER — Emergency Department

## 2024-05-15 ENCOUNTER — Other Ambulatory Visit: Payer: Self-pay

## 2024-05-15 DIAGNOSIS — N939 Abnormal uterine and vaginal bleeding, unspecified: Secondary | ICD-10-CM | POA: Insufficient documentation

## 2024-05-15 DIAGNOSIS — R9389 Abnormal findings on diagnostic imaging of other specified body structures: Secondary | ICD-10-CM | POA: Insufficient documentation

## 2024-05-15 DIAGNOSIS — M7062 Trochanteric bursitis, left hip: Secondary | ICD-10-CM | POA: Diagnosis not present

## 2024-05-15 LAB — TYPE AND SCREEN
ABO/RH(D): A POS
Antibody Screen: NEGATIVE

## 2024-05-15 LAB — CBC
HCT: 46.5 % — ABNORMAL HIGH (ref 36.0–46.0)
Hemoglobin: 15.2 g/dL — ABNORMAL HIGH (ref 12.0–15.0)
MCH: 30.1 pg (ref 26.0–34.0)
MCHC: 32.7 g/dL (ref 30.0–36.0)
MCV: 92.1 fL (ref 80.0–100.0)
Platelets: 319 K/uL (ref 150–400)
RBC: 5.05 MIL/uL (ref 3.87–5.11)
RDW: 13.7 % (ref 11.5–15.5)
WBC: 13 K/uL — ABNORMAL HIGH (ref 4.0–10.5)
nRBC: 0 % (ref 0.0–0.2)

## 2024-05-15 LAB — BASIC METABOLIC PANEL WITH GFR
Anion gap: 12 (ref 5–15)
BUN: 13 mg/dL (ref 8–23)
CO2: 20 mmol/L — ABNORMAL LOW (ref 22–32)
Calcium: 9.5 mg/dL (ref 8.9–10.3)
Chloride: 105 mmol/L (ref 98–111)
Creatinine, Ser: 1.09 mg/dL — ABNORMAL HIGH (ref 0.44–1.00)
GFR, Estimated: 55 mL/min — ABNORMAL LOW (ref >60)
Glucose, Bld: 145 mg/dL — ABNORMAL HIGH (ref 70–99)
Potassium: 3.9 mmol/L (ref 3.5–5.1)
Sodium: 137 mmol/L (ref 135–145)

## 2024-05-15 NOTE — ED Triage Notes (Signed)
 Pt via POV from home. Pt c/o vaginal bleeding that started Wednesday morning. States it was light spotting and today it was more heavy with clots. Reports some lower abd pain. Denies urinary symptoms. States she has appt with OBGYN tomorrow but they told her to come here. Pt is A&Ox4 and NAD, ambulatory to triage with steady gait,

## 2024-05-15 NOTE — ED Provider Notes (Signed)
 [facility] Provider Note    Event Date/Time   First MD Initiated Contact with Patient 05/15/24 1807     (approximate)   History   Vaginal Bleeding   HPI  Denise Glass is a 68 y.o. female who presents to the ED for evaluation of Vaginal Bleeding   I review a PCP visit from 3 days ago.  Morbidly obese patient with history of HTN, GERD.  No anticoagulation.  Patient presents due to 1-2 days of vaginal bleeding.  No other bleeding diatheses such as hematochezia, hematuria.  No other vaginal discharge or purulence.  No fevers.  Does report some mild pelvic cramping  No concerns for vaginal foreign bodies or trauma  She has an appointment tomorrow afternoon at 4:15 PM with Dr. Lovetta, OBGYN, for these symptoms   Physical Exam   Triage Vital Signs: ED Triage Vitals  Encounter Vitals Group     BP 05/15/24 1732 (!) 142/53     Girls Systolic BP Percentile --      Girls Diastolic BP Percentile --      Boys Systolic BP Percentile --      Boys Diastolic BP Percentile --      Pulse Rate 05/15/24 1732 (!) 106     Resp 05/15/24 1732 18     Temp 05/15/24 1732 (!) 97.5 F (36.4 C)     Temp Source 05/15/24 1732 Oral     SpO2 05/15/24 1732 94 %     Weight 05/15/24 1731 264 lb (119.7 kg)     Height 05/15/24 1731 5' 2 (1.575 m)     Head Circumference --      Peak Flow --      Pain Score 05/15/24 1731 3     Pain Loc --      Pain Education --      Exclude from Growth Chart --     Most recent vital signs: Vitals:   05/15/24 1732  BP: (!) 142/53  Pulse: (!) 106  Resp: 18  Temp: (!) 97.5 F (36.4 C)  SpO2: 94%    General: Awake, no distress.  CV:  Good peripheral perfusion.  Resp:  Normal effort.  Abd:  No distention.  Soft and benign throughout, no tenderness, guarding or peritoneal features MSK:  No deformity noted.  Neuro:  No focal deficits appreciated. Other:     ED Results / Procedures / Treatments   Labs (all labs ordered  are listed, but only abnormal results are displayed) Labs Reviewed  CBC - Abnormal; Notable for the following components:      Result Value   WBC 13.0 (*)    Hemoglobin 15.2 (*)    HCT 46.5 (*)    All other components within normal limits  BASIC METABOLIC PANEL WITH GFR - Abnormal; Notable for the following components:   CO2 20 (*)    Glucose, Bld 145 (*)    Creatinine, Ser 1.09 (*)    GFR, Estimated 55 (*)    All other components within normal limits  TYPE AND SCREEN    EKG   RADIOLOGY Pelvic ultrasound interpreted by me with thickened endometrium  Official radiology report(s): US  PELVIC COMPLETE WITH TRANSVAGINAL Result Date: 05/15/2024 CLINICAL DATA:  Vaginal bleeding. EXAM: TRANSABDOMINAL AND TRANSVAGINAL ULTRASOUND OF PELVIS TECHNIQUE: Both transabdominal and transvaginal ultrasound examinations of the pelvis were performed. Transabdominal technique was performed for global imaging of the pelvis including uterus, ovaries, adnexal regions, and pelvic cul-de-sac. It was necessary  to proceed with endovaginal exam following the transabdominal exam to visualize the ovaries and endometrium. COMPARISON:  None Available. FINDINGS: Uterus Measurements: 7.9 x 3.1 x 3.4 cm = volume: 44 mL. No fibroids or other mass visualized. Endometrium Thickness: 15 mm. Diffusely heterogeneous. Contains vascularity. Small cystic spaces present. Right ovary Not visualized. Left ovary Not visualized. Other findings No abnormal free fluid. Heterogeneous material is also seen within the endocervical canal which is nonvascular. IMPRESSION: 1. Thickened, heterogeneous endometrium with vascularity. Findings are concerning for endometrial carcinoma. Recommend gynecologic consultation. 2. Heterogeneous material within the endocervical canal, likely blood products. 3. Nonvisualization of the ovaries. Electronically Signed   By: Greig Pique M.D.   On: 05/15/2024 19:59    PROCEDURES and  INTERVENTIONS:  Procedures  Medications - No data to display   IMPRESSION / MDM / ASSESSMENT AND PLAN / ED COURSE  I reviewed the triage vital signs and the nursing notes.  Differential diagnosis includes, but is not limited to, retained foreign body, PID, endometrial carcinoma  {Patient presents with symptoms of an acute illness or injury that is potentially life-threatening.  Patient presents with painless vaginal bleeding over a couple days, with a thickened endometrium on ultrasound and suitable for outpatient OB/GYN follow-up.  Presenting tachycardia is noted but she remains hemodynamically stable.  Hemoglobin at baseline, renal dysfunction at baseline.  Ultrasound, as above.  Suitable for outpatient management.  Clinical Course as of 05/15/24 2032  Thu May 15, 2024  2024 Reassessed and discussed u/s results. Talked with Daughter in law on the phone too [DS]    Clinical Course User Index [DS] Claudene Rover, MD     FINAL CLINICAL IMPRESSION(S) / ED DIAGNOSES   Final diagnoses:  Abnormal uterine bleeding (AUB)  Thickened endometrium     Rx / DC Orders   ED Discharge Orders     None        Note:  This document was prepared using Dragon voice recognition software and may include unintentional dictation errors.   Claudene Rover, MD 05/15/24 2034

## 2024-05-16 DIAGNOSIS — N950 Postmenopausal bleeding: Secondary | ICD-10-CM | POA: Diagnosis not present

## 2024-05-16 DIAGNOSIS — R9389 Abnormal findings on diagnostic imaging of other specified body structures: Secondary | ICD-10-CM | POA: Diagnosis not present

## 2024-05-16 DIAGNOSIS — Z124 Encounter for screening for malignant neoplasm of cervix: Secondary | ICD-10-CM | POA: Diagnosis not present

## 2024-06-02 DIAGNOSIS — R9389 Abnormal findings on diagnostic imaging of other specified body structures: Secondary | ICD-10-CM | POA: Diagnosis not present

## 2024-06-02 DIAGNOSIS — N950 Postmenopausal bleeding: Secondary | ICD-10-CM | POA: Diagnosis not present

## 2024-06-11 DIAGNOSIS — Z03818 Encounter for observation for suspected exposure to other biological agents ruled out: Secondary | ICD-10-CM | POA: Diagnosis not present

## 2024-06-11 DIAGNOSIS — U071 COVID-19: Secondary | ICD-10-CM | POA: Diagnosis not present

## 2024-06-11 DIAGNOSIS — J029 Acute pharyngitis, unspecified: Secondary | ICD-10-CM | POA: Diagnosis not present

## 2024-07-09 DIAGNOSIS — N840 Polyp of corpus uteri: Secondary | ICD-10-CM | POA: Diagnosis not present

## 2024-07-09 DIAGNOSIS — R9389 Abnormal findings on diagnostic imaging of other specified body structures: Secondary | ICD-10-CM | POA: Diagnosis not present

## 2024-07-09 DIAGNOSIS — N950 Postmenopausal bleeding: Secondary | ICD-10-CM | POA: Diagnosis not present

## 2024-07-21 ENCOUNTER — Ambulatory Visit: Payer: Self-pay

## 2024-07-21 DIAGNOSIS — R300 Dysuria: Secondary | ICD-10-CM | POA: Diagnosis not present

## 2024-07-21 DIAGNOSIS — B3731 Acute candidiasis of vulva and vagina: Secondary | ICD-10-CM | POA: Diagnosis not present

## 2024-07-21 DIAGNOSIS — A499 Bacterial infection, unspecified: Secondary | ICD-10-CM | POA: Diagnosis not present

## 2024-07-21 DIAGNOSIS — N390 Urinary tract infection, site not specified: Secondary | ICD-10-CM | POA: Diagnosis not present

## 2024-07-21 NOTE — Telephone Encounter (Signed)
 FYI Only or Action Required?: Action required by provider: request for appointment and clinical question for provider.  Patient was last seen in primary care on 05/12/2024 by Antonette Angeline ORN, NP.  Called Nurse Triage reporting Dysuria.  Symptoms began several days ago.  Interventions attempted: Nothing.  Symptoms are: unchanged.  Triage Disposition: See Physician Within 24 Hours  Patient/caregiver understands and will follow disposition?: Yes  Copied from CRM [id number]. Topic: Clinical - Red Word Triage >> Jul 21, 2024  4:42 PM Dedra B wrote: Red Word that prompted transfer to Nurse Triage: Pt is having burning with urination and thinks she has a bladder infection. Warm transfer to NT. Reason for Disposition  Urinating more frequently than usual (i.e., frequency) OR new-onset of the feeling of an urgent need to urinate (i.e., urgency)  Answer Assessment - Initial Assessment Questions No available appts. Advised UC today and ED if symptoms worsen. Pt reports will go to UC.  1. SYMPTOM: What's the main symptom you're concerned about? (e.g., frequency, incontinence)     Burning with urination  2. ONSET: When did the    start?     Week ago, stopped, started back couple of days ago 3. PAIN: Is there any pain? If Yes, ask: How bad is it? (Scale: 1-10; mild, moderate, severe)     4/10; have not taken anything 4. CAUSE: What do you think is causing the symptoms?     uti 5. OTHER SYMPTOMS: Do you have any other symptoms? (e.g., blood in urine, fever, flank pain, pain with urination)     Denies blood urine, back or abd pain, fever, n/v/chills  Protocols used: Urinary Symptoms-A-AH

## 2024-07-22 NOTE — Telephone Encounter (Signed)
 Patient could have been seen in office today.  We had plenty of openings.

## 2024-08-04 ENCOUNTER — Ambulatory Visit: Payer: Self-pay

## 2024-08-04 NOTE — Telephone Encounter (Signed)
 Will discuss at upcoming appointment tomorrow

## 2024-08-04 NOTE — Telephone Encounter (Signed)
 FYI Only or Action Required?: FYI only for provider.  Patient was last seen in primary care on 05/12/2024 by Antonette Angeline ORN, NP.  Called Nurse Triage reporting Diarrhea.  Symptoms began a week ago.  Interventions attempted: Prescription medications: ciprofloxacin .  Symptoms are: gradually improving.  Triage Disposition: See PCP When Office is Open (Within 3 Days)  Patient/caregiver understands and will follow disposition?: Yes Copied from CRM #[id number]. Topic: Clinical - Red Word Triage >> Aug 04, 2024 10:33 AM Tonda B wrote: [facility] that prompted transfer to Nurse Triage: patient is calling saying that she has diarrhea as well as her pills not digesting as well as vomiting Reason for Disposition  Diarrhea continues for > 3 days after stopping the antibiotic  Answer Assessment - Initial Assessment Questions Patient with diarrhea post 2nd round of ABX for UTI. Cefdinir did not clear up burning sensation. Prescribed Ciprofloxacin  at UC on 10/10. By 10/11 she was having diarrhea episodes about 5x day. She took medicine for 5 days of 10 day course before having to stop. Diarrhea has slowed to twice daily, and consistency is not quite water. She is concerned bc she found a while pill of her oxybutinin that had not disolved/absorbed into her system. Dx with diverticulosis this summer and unsure if pill got caught in pocket. Has occurred one other instance mnths ago.   1. ANTIBIOTIC: What antibiotic are you taking? How many times per day?     1st abx-Cefdinir- did not help, 2nd ciprofloxacin - with 1 day of abx- massive diarrhea since.  2. ANTIBIOTIC ONSET: When was the antibiotic started?     10/10- Stopped on day 5 of 10day course 3. DIARRHEA SEVERITY: How bad is the diarrhea? How many more stools have you had in the past 24 hours than normal?      Slowed down now  4. ONSET: When did the diarrhea begin?      10/11- diarrhea started  5. BM CONSISTENCY: How loose or watery is  the diarrhea?      Loose- not quite water 6. VOMITING: Are you also vomiting? If Yes, ask: How many times in the past 24 hours?      Very nauseous- taking meclizine   7. ABDOMEN PAIN: Are you having any abdomen pain? If Yes, ask: What does it feel like? (e.g., crampy, dull, intermittent, constant)     Denies pain- nausea  8. ABDOMEN PAIN SEVERITY: If present, ask: How bad is the pain?  (e.g., Scale 1-10; mild, moderate, or severe)     Denies  9. ORAL INTAKE: If vomiting, Have you been able to drink liquids? How much liquids have you had in the past 24 hours?     Hydrated but maybe not as well as could be 10. HYDRATION: Any signs of dehydration? (e.g., dry mouth [not just dry lips], too weak to stand, dizziness, new weight loss) When did you last urinate?       Denies  11. EXPOSURE: Have you traveled to a foreign country recently? Have you been exposed to anyone with diarrhea? Could you have eaten any food that was spoiled?       Denies that she is aware of.  12. OTHER SYMPTOMS: Do you have any other symptoms? (e.g., fever, blood in stool)       denies  Protocols used: Diarrhea on Antibiotics-A-AH

## 2024-08-05 ENCOUNTER — Ambulatory Visit (INDEPENDENT_AMBULATORY_CARE_PROVIDER_SITE_OTHER): Admitting: Internal Medicine

## 2024-08-05 ENCOUNTER — Encounter: Payer: Self-pay | Admitting: Internal Medicine

## 2024-08-05 VITALS — BP 138/64 | Ht 62.0 in | Wt 266.0 lb

## 2024-08-05 DIAGNOSIS — K591 Functional diarrhea: Secondary | ICD-10-CM | POA: Diagnosis not present

## 2024-08-05 DIAGNOSIS — B379 Candidiasis, unspecified: Secondary | ICD-10-CM | POA: Diagnosis not present

## 2024-08-05 DIAGNOSIS — T887XXA Unspecified adverse effect of drug or medicament, initial encounter: Secondary | ICD-10-CM

## 2024-08-05 DIAGNOSIS — N3000 Acute cystitis without hematuria: Secondary | ICD-10-CM | POA: Diagnosis not present

## 2024-08-05 NOTE — Progress Notes (Signed)
 Subjective:    Patient ID: Denise Glass, female    DOB: Apr 18, 1956, 68 y.o.   MRN: [id number]  HPI  Discussed the use of AI scribe software for clinical note transcription with the patient, who gave verbal consent to proceed.   Denise Glass is a 68 year old female who presents with concerns about undissolved medication and recent antibiotic-associated diarrhea.  She experienced diarrhea after taking antibiotics for a urinary tract infection. Initially, she was on cefdinir, which was ineffective, and then switched to ciprofloxacin , completing only 5 out of the prescribed 10 days. The diarrhea, involving up to five stools a day, has resolved as of yesterday. She also developed a yeast infection, for which she took a single-dose medication, and reports that it has resolved.  She is concerned about her oxybutynin  medication not dissolving properly, as she has observed the whole pill in her stool. This has happened a few times, and she wonders if it is related to her diverticulosis diagnosed last year. She has been on oxybutynin  since 2015 due to urinary frequency issues that arose before a trip to [place].  No current abdominal pain, nausea, or vomiting. She recalls a previous urine culture that grew Klebsiella pneumoniae. She has no current symptoms of a urinary tract infection and reports that all related symptoms have resolved.       Review of Systems  Past Medical History:  Diagnosis Date   Complication of anesthesia    pt reports her appetite takes 2-3 weeks to come back after anesthesia.   Depression    DJD (degenerative joint disease)    GERD (gastroesophageal reflux disease)    Headache 2016   cluster head aches, Head aches have subsided.   Hypothyroidism    Insomnia    Mitral valve prolapse 2003   Followed by Dr. Florencio (prn)   PONV (postoperative nausea and vomiting) 2002   With BTL   Urine frequency     Current Outpatient Medications  Medication Sig Dispense  Refill   aspirin  EC 81 MG tablet Take 1 tablet (81 mg total) by mouth daily. Swallow whole.     atorvastatin  (LIPITOR) 20 MG tablet Take 1 tablet (20 mg total) by mouth daily. 90 tablet 3   dicyclomine  (BENTYL ) 10 MG capsule Take 1 capsule (10 mg total) by mouth 4 (four) times daily -  before meals and at bedtime. As needed (Patient not taking: Reported on 05/12/2024) 30 capsule 0   diphenhydrAMINE  HCl (BENADRYL  ALLERGY PO) Take by mouth.     ibuprofen (ADVIL) 200 MG tablet Take by mouth.     meclizine  (ANTIVERT ) 25 MG tablet Take 1 tablet (25 mg total) by mouth 3 (three) times daily as needed for dizziness. 30 tablet 1   olmesartan  (BENICAR ) 20 MG tablet Take 0.5 tablets (10 mg total) by mouth daily. 45 tablet 1   omeprazole (PRILOSEC) 20 MG capsule Take by mouth.     ondansetron  (ZOFRAN -ODT) 4 MG disintegrating tablet Take 1 tablet (4 mg total) by mouth every 8 (eight) hours as needed for nausea or vomiting. 30 tablet 0   oxybutynin  (DITROPAN  XL) 15 MG 24 hr tablet TAKE 1 TABLET BY MOUTH EVERYDAY AT BEDTIME 90 tablet 1   traMADol  (ULTRAM ) 50 MG tablet Take 50 mg by mouth every 6 (six) hours as needed.     zolpidem  (AMBIEN ) 10 MG tablet TAKE 1 TABLET (10 MG TOTAL) BY MOUTH AT BEDTIME AS NEEDED FOR SLEEP 30 tablet 0   No  current facility-administered medications for this visit.    Allergies  Allergen Reactions   Other Other (See Comments)   Prednisone Other (See Comments)    Mouth sores and thrush    Sulfa Antibiotics Other (See Comments) and Nausea And Vomiting    Unknown Stevens-Johnson Syndrome (affecting mucous membranes)   Prednazoline Other (See Comments)    Family History  Problem Relation Age of Onset   COPD Mother    Heart disease Mother    Stroke Mother    Heart disease Father    COPD Father    Cancer Father        throat   Healthy Brother    Drug abuse Brother    Healthy Son     Social History   Socioeconomic History   Marital status: Widowed    Spouse name:  Not on file   Number of children: Not on file   Years of education: Not on file   Highest education level: GED or equivalent  Occupational History   Occupation: retired  Tobacco Use   Smoking status: Never   Smokeless tobacco: Never  Vaping Use   Vaping status: Never Used  Substance and Sexual Activity   Alcohol use: No   Drug use: No   Sexual activity: Not Currently  Other Topics Concern   Not on file  Social History Narrative   Patient is a 68 y/o widow (2011) who lives independently at home. 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 She has one son, a daughter-in-law and a grandson that live locally.  She struggles with her relationships with mother and brother.   Social Drivers of Corporate investment banker Strain: Low Risk  (05/15/2024)   Received from [facility]   Overall Financial Resource Strain (CARDIA)    Difficulty of Paying Living Expenses: Not hard at all  Food Insecurity: No Food Insecurity (05/15/2024)   Received from [facility]   Hunger Vital Sign    Within the past 12 months, you worried that your food would run out before you got the money to buy more.: Never true    Within the past 12 months, the food you bought just didn't last and you didn't have money to get more.: Never true  Transportation Needs: No Transportation Needs (05/15/2024)   Received from [facility] - Transportation    In the past 12 months, has lack of transportation kept you from medical appointments or from getting medications?: No    Lack of Transportation (Non-Medical): No  Physical Activity: Inactive (05/02/2024)   Exercise Vital Sign    Days of Exercise per Week: 0 days    Minutes of Exercise per Session: 0 min  Stress: No Stress Concern Present (05/02/2024)   [employer] [facility] [employer] [facility] - Occupational Stress Questionnaire    Feeling of Stress: Not at all  Recent Concern:  Stress - Stress Concern Present (03/16/2024)   [employer] [facility] [employer]ational [employer] - Occupational Stress Questionnaire    Feeling of Stress : Rather much  Social Connections: Moderately Isolated (05/02/2024)   Social Connection and Isolation Panel    Frequency of Communication with Friends and Family: More than three times a week    Frequency of Social Gatherings with Friends and Family: Never    Attends Religious Services: More than 4 times per year    Active Member of Clubs or Organizations: No    Attends  Club or Organization Meetings: Never    Marital Status: Widowed  Intimate Partner Violence: Not At Risk (05/02/2024)   Humiliation, Afraid, Rape, and Kick questionnaire    Fear of Current or Ex-Partner: No    Emotionally Abused: No    Physically Abused: No    Sexually Abused: No     Constitutional: Denies fever, malaise, fatigue, headache or abrupt weight changes.  Respiratory: Denies difficulty breathing, shortness of breath, cough or sputum production.   Cardiovascular: Denies chest pain, chest tightness, palpitations or swelling in the hands or feet.  Gastrointestinal: Patient reports diarrhea.  Denies bloating, constipation, or blood in the stool.  GU: Denies urgency, frequency, pain with urination, burning sensation, blood in urine, odor or discharge.  No other specific complaints in a complete review of systems (except as listed in HPI above).     Objective:   Physical Exam  BP 138/64 (BP Location: Left Wrist, Patient Position: Sitting, Cuff Size: Large)   Ht 5' 2 (1.575 m)   Wt 266 lb (120.7 kg)   BMI 48.65 kg/m     Wt Readings from Last 3 Encounters:  05/15/24 264 lb (119.7 kg)  05/12/24 265 lb 3.2 oz (120.3 kg)  04/11/24 266 lb (120.7 kg)    General: Appears her stated age, obese, in NAD. Cardiovascular: Normal rate. Pulmonary/Chest: Normal effort. No respiratory distress.  Abdomen:  Normal bowel sounds.  Musculoskeletal: No difficulty with gait.     BMET    Component Value Date/Time   NA 137 05/15/2024 1734   NA 138 11/14/2019 1451   NA 140 01/17/2015 1431   K 3.9 05/15/2024 1734   K 4.5 01/17/2015 1431   CL 105 05/15/2024 1734   CL 104 01/17/2015 1431   CO2 20 (L) 05/15/2024 1734   CO2 29 01/17/2015 1431   GLUCOSE 145 (H) 05/15/2024 1734   GLUCOSE 107 (H) 01/17/2015 1431   BUN 13 05/15/2024 1734   BUN 13 11/14/2019 1451   BUN 13 01/17/2015 1431   CREATININE 1.09 (H) 05/15/2024 1734   CREATININE 0.93 03/17/2024 1505   CALCIUM  9.5 05/15/2024 1734   CALCIUM  10.2 01/17/2015 1431   GFRNONAA 55 (L) 05/15/2024 1734   GFRNONAA >60 01/17/2015 1431   GFRAA >60 03/19/2020 0022   GFRAA >60 01/17/2015 1431    Lipid Panel     Component Value Date/Time   CHOL 134 03/17/2024 1505   TRIG 135 03/17/2024 1505   HDL 59 03/17/2024 1505   CHOLHDL 2.3 03/17/2024 1505   VLDL 31 (H) 05/31/2017 0837   LDLCALC 53 03/17/2024 1505    CBC    Component Value Date/Time   WBC 13.0 (H) 05/15/2024 1734   RBC 5.05 05/15/2024 1734   HGB 15.2 (H) 05/15/2024 1734   HGB 15.3 11/14/2019 1451   HCT 46.5 (H) 05/15/2024 1734   HCT 44.6 11/14/2019 1451   PLT 319 05/15/2024 1734   PLT 343 11/14/2019 1451   MCV 92.1 05/15/2024 1734   MCV 88 11/14/2019 1451   MCV 89 01/17/2015 1431   MCH 30.1 05/15/2024 1734   MCHC 32.7 05/15/2024 1734   RDW 13.7 05/15/2024 1734   RDW 13.3 11/14/2019 1451   RDW 13.5 01/17/2015 1431   LYMPHSABS 1.9 11/14/2019 1451   MONOABS 528 05/31/2017 0837   EOSABS 0.2 11/14/2019 1451   BASOSABS 0.0 11/14/2019 1451    Hgb A1C Lab Results  Component Value Date   HGBA1C 6.0 (H) 03/17/2024  Assessment & Plan:   Assessment and Plan    Overactive bladder Undissolved oxybutynin  XL tablets observed in stool. - Discussed switching to non-XL oxybutynin ; patient prefers current regimen. - Consider discontinuing oxybutynin  to assess medication necessity.     Diarrhea, medication side effect, antibiotic  induced yeast infections,UTI All symptoms have now resolved -Will continue to monitor   RTC in 2 months, follow-up chronic conditions Angeline Laura, NP

## 2024-08-05 NOTE — Patient Instructions (Signed)
 Diarrhea, Adult Diarrhea is when you pass loose and sometimes watery poop (stool) often. Diarrhea can make you feel weak and cause you to lose water in your body (get dehydrated). Losing water in your body can cause you to: Feel tired and thirsty. Have a dry mouth. Go pee (urinate) less often. Diarrhea often lasts 2-3 days. It can last longer if it is a sign of something more serious. Be sure to treat your diarrhea as told by your doctor. Follow these instructions at home: Eating and drinking     Follow these instructions as told by your doctor: Take an ORS (oral rehydration solution). This is a drink that helps you replace fluids and minerals your body lost. It is sold at pharmacies and stores. Drink enough fluid to keep your pee (urine) pale yellow. Drink fluids such as: Water. You can also get fluids by sucking on ice chips. Diluted fruit juice. Low-calorie sports drinks. Milk. Avoid drinking fluids that have a lot of sugar or caffeine in them. These include soda, energy drinks, and regular sports drinks. Avoid alcohol. Eat bland, easy-to-digest foods in small amounts as you are able. These foods include: Bananas. Applesauce. Rice. Low-fat (lean) meats. Toast. Crackers. Avoid spicy or fatty foods.  Medicines Take over-the-counter and prescription medicines only as told by your doctor. If you were prescribed antibiotics, take them as told by your doctor. Do not stop taking them even if you start to feel better. General instructions  Wash your hands often using soap and water for 20 seconds. If soap and water are not available, use hand sanitizer. Others in your home should wash their hands as well. Wash your hands: After using the toilet or changing a diaper. Before preparing, cooking, or serving food. While caring for a sick person. While visiting someone in a hospital. Rest at home while you get better. Take a warm bath to help with any burning or pain from having  diarrhea. Watch your condition for any changes. Contact a doctor if: You have a fever. Your diarrhea gets worse. You have new symptoms. You vomit every time you eat or drink. You feel light-headed, dizzy, or you have a headache. You have muscle cramps. You have signs of losing too much water in your body, such as: Dark pee, very little pee, or no pee. Cracked lips. Dry mouth. Sunken eyes. Sleepiness. Weakness. You have bloody or black poop or poop that looks like tar. You have very bad pain, cramping, or bloating in your belly (abdomen). Your skin feels cold and clammy. You feel confused. Get help right away if: You have chest pain. Your heart is beating very quickly. You have trouble breathing or you are breathing very quickly. You feel very weak or you faint. These symptoms may be an emergency. Get help right away. Call 911. Do not wait to see if the symptoms will go away. Do not drive yourself to the hospital. This information is not intended to replace advice given to you by your health care provider. Make sure you discuss any questions you have with your health care provider. Document Revised: 03/21/2022 Document Reviewed: 03/21/2022 Elsevier Patient Education  2024 [employer].

## 2024-08-19 LAB — COMPREHENSIVE METABOLIC PANEL WITH GFR
AG Ratio: 1.7 (calc) (ref 1.0–2.5)
ALT: 14 U/L (ref 6–29)
AST: 15 U/L (ref 10–35)
Albumin: 4.3 g/dL (ref 3.6–5.1)
Alkaline phosphatase (APISO): 125 U/L (ref 37–153)
BUN: 10 mg/dL (ref 7–25)
CO2: 21 mmol/L (ref 20–32)
Calcium: 9.3 mg/dL (ref 8.6–10.4)
Chloride: 102 mmol/L (ref 98–110)
Creat: 0.93 mg/dL (ref 0.50–1.05)
Globulin: 2.6 g/dL (ref 1.9–3.7)
Glucose, Bld: 173 mg/dL — ABNORMAL HIGH (ref 65–139)
Potassium: 3.8 mmol/L (ref 3.5–5.3)
Sodium: 136 mmol/L (ref 135–146)
Total Bilirubin: 0.6 mg/dL (ref 0.2–1.2)
Total Protein: 6.9 g/dL (ref 6.1–8.1)
eGFR: 67 mL/min/1.73m2 (ref >=60)

## 2024-08-19 LAB — HEMOGLOBIN A1C
Hgb A1c MFr Bld: 6 % — ABNORMAL HIGH (ref <5.7)
Mean Plasma Glucose: 126 mg/dL
eAG (mmol/L): 7 mmol/L

## 2024-08-19 LAB — CBC
HCT: 45.8 % — ABNORMAL HIGH (ref 35.0–45.0)
Hemoglobin: 15.4 g/dL (ref 11.7–15.5)
MCH: 30.7 pg (ref 27.0–33.0)
MCHC: 33.6 g/dL (ref 32.0–36.0)
MCV: 91.2 fL (ref 80.0–100.0)
MPV: 10.6 fL (ref 7.5–12.5)
Platelets: 298 Thousand/uL (ref 140–400)
RBC: 5.02 Million/uL (ref 3.80–5.10)
RDW: 13 % (ref 11.0–15.0)
WBC: 10.4 Thousand/uL (ref 3.8–10.8)

## 2024-08-19 LAB — LIPID PANEL
Cholesterol: 134 mg/dL (ref <200)
HDL: 59 mg/dL (ref >=50)
LDL Cholesterol (Calc): 53 mg/dL
Non-HDL Cholesterol (Calc): 75 mg/dL (ref <130)
Total CHOL/HDL Ratio: 2.3 (calc) (ref <5.0)
Triglycerides: 135 mg/dL (ref <150)

## 2024-08-19 LAB — TSH: TSH: 3.51 m[IU]/L (ref 0.40–4.50)

## 2024-08-21 ENCOUNTER — Other Ambulatory Visit: Payer: Self-pay | Admitting: Internal Medicine

## 2024-08-21 DIAGNOSIS — G4733 Obstructive sleep apnea (adult) (pediatric): Secondary | ICD-10-CM

## 2024-08-21 DIAGNOSIS — R0602 Shortness of breath: Secondary | ICD-10-CM

## 2024-08-21 DIAGNOSIS — I700 Atherosclerosis of aorta: Secondary | ICD-10-CM

## 2024-08-26 ENCOUNTER — Ambulatory Visit
Admission: RE | Admit: 2024-08-26 | Discharge: 2024-08-26 | Disposition: A | Discharge status: Home / Self Care | Payer: Self-pay | Source: Ambulatory Visit | Attending: Internal Medicine | Admitting: Internal Medicine

## 2024-08-26 DIAGNOSIS — R0602 Shortness of breath: Secondary | ICD-10-CM | POA: Insufficient documentation

## 2024-08-26 DIAGNOSIS — G4733 Obstructive sleep apnea (adult) (pediatric): Secondary | ICD-10-CM | POA: Insufficient documentation

## 2024-08-26 DIAGNOSIS — I700 Atherosclerosis of aorta: Secondary | ICD-10-CM | POA: Insufficient documentation

## 2024-08-29 ENCOUNTER — Other Ambulatory Visit: Payer: Self-pay | Admitting: Internal Medicine

## 2024-08-29 DIAGNOSIS — I700 Atherosclerosis of aorta: Secondary | ICD-10-CM

## 2024-08-29 DIAGNOSIS — R0602 Shortness of breath: Secondary | ICD-10-CM

## 2024-08-29 DIAGNOSIS — R931 Abnormal findings on diagnostic imaging of heart and coronary circulation: Secondary | ICD-10-CM

## 2024-09-18 ENCOUNTER — Ambulatory Visit: Admitting: Internal Medicine

## 2024-09-19 ENCOUNTER — Telehealth (HOSPITAL_COMMUNITY): Payer: Self-pay | Admitting: Emergency Medicine

## 2024-09-19 ENCOUNTER — Encounter (HOSPITAL_COMMUNITY): Payer: Self-pay | Admitting: Emergency Medicine

## 2024-09-19 NOTE — Telephone Encounter (Signed)
 Attempted to call patient regarding upcoming cardiac CT appointment. Left message on voicemail with name and callback number Rockwell Alexandria RN Navigator Cardiac [facility] [phone] Office [phone] Cell

## 2024-09-22 ENCOUNTER — Ambulatory Visit
Admission: RE | Admit: 2024-09-22 | Discharge: 2024-09-22 | Disposition: A | Source: Ambulatory Visit | Attending: Internal Medicine | Admitting: Internal Medicine

## 2024-09-22 DIAGNOSIS — R0602 Shortness of breath: Secondary | ICD-10-CM

## 2024-09-22 DIAGNOSIS — I700 Atherosclerosis of aorta: Secondary | ICD-10-CM

## 2024-09-22 DIAGNOSIS — R931 Abnormal findings on diagnostic imaging of heart and coronary circulation: Secondary | ICD-10-CM

## 2024-09-22 MED ORDER — IOHEXOL 350 MG/ML SOLN
100.0000 mL | Freq: Once | INTRAVENOUS | Status: AC | PRN
  Administered 2024-09-22: 100 mL via INTRAVENOUS

## 2024-09-22 MED ORDER — NITROGLYCERIN 0.4 MG SL SUBL
0.8000 mg | SUBLINGUAL_TABLET | Freq: Once | SUBLINGUAL | Status: AC
  Administered 2024-09-22: 0.8 mg via SUBLINGUAL
  Filled 2024-09-22: qty 25

## 2024-09-22 NOTE — Progress Notes (Signed)
 Patient tolerated procedure well. W/C to lobby.  Ambulate w/o difficulty. Denies light headedness or being dizzy. Encouraged to drink extra water today and reasoning explained. Verbalized understanding. All questions answered. ABC intact. No further needs. Discharge from procedure area w/o issues.

## 2024-10-13 NOTE — Progress Notes (Unsigned)
 "  Subjective:    Patient ID: Denise Glass, female    DOB: 01/07/1956, 68 y.o.   MRN: [id number]  HPI  Pt presents to the clinic today for 57-month follow up of chronic conditions.   Atrial tachycardia: Paroxysmal. She is not currently taking any medications for this.  She does follow with cardiology.  ECG from 04/2021 reviewed.  HTN: Her BP today is 132/84.  She is taking olmesartan  as prescribed. She does not check her BP at home. ECG from 04/2021 reviewed.  HLD with aortic atherosclerosis: Her last LDL was 53, triglycerides 864, 03/2024.  She denies myalgias on atorvastatin .  She is taking aspirin  as well.  She does not consume a low-fat diet.  GERD: She is not sure what triggers this. She denies breakthrough on omeprazole.  Upper GI from 02/2022 reviewed.  Insomnia: She has difficulty falling asleep.  She is taking zolpidem  as needed.  There is no sleep study on file.  Anxiety and depression (mild, in remission): She is not currently taking any medications for this.  She is not currently seeing a therapist.  She denies SI/HI.  Hypothyroidism: She is not taking any medication for her thyroid  at this time.  She does not follow with endocrinology.  OAB: She reports mainly urinary frequency.  She is taking oxybutynin  as prescribed.  She does not follow with urology.  Prediabetes: Her last A1c was 6%, 03/2024.  She is not taking any oral diabetic medication at this time.  She does not check her sugars.   Review of Systems     Past Medical History:  Diagnosis Date   Complication of anesthesia    pt reports her appetite takes 2-3 weeks to come back after anesthesia.   Depression    DJD (degenerative joint disease)    GERD (gastroesophageal reflux disease)    Headache 2016   cluster head aches, Head aches have subsided.   Hypothyroidism    Insomnia    Mitral valve prolapse 2003   Followed by Dr. Florencio (prn)   PONV (postoperative nausea and vomiting) 2002   With BTL   Urine  frequency     Current Outpatient Medications  Medication Sig Dispense Refill   aspirin  EC 81 MG tablet Take 1 tablet (81 mg total) by mouth daily. Swallow whole.     atorvastatin  (LIPITOR) 20 MG tablet Take 1 tablet (20 mg total) by mouth daily. 90 tablet 3   diphenhydrAMINE  HCl (BENADRYL  ALLERGY PO) Take by mouth.     ibuprofen (ADVIL) 200 MG tablet Take by mouth.     meclizine  (ANTIVERT ) 25 MG tablet Take 1 tablet (25 mg total) by mouth 3 (three) times daily as needed for dizziness. 30 tablet 1   olmesartan  (BENICAR ) 20 MG tablet Take 0.5 tablets (10 mg total) by mouth daily. 45 tablet 1   omeprazole (PRILOSEC) 20 MG capsule Take by mouth.     ondansetron  (ZOFRAN -ODT) 4 MG disintegrating tablet Take 1 tablet (4 mg total) by mouth every 8 (eight) hours as needed for nausea or vomiting. 30 tablet 0   oxybutynin  (DITROPAN  XL) 15 MG 24 hr tablet TAKE 1 TABLET BY MOUTH EVERYDAY AT BEDTIME 90 tablet 1   progesterone (PROMETRIUM) 200 MG capsule Take 200 mg by mouth daily.     traMADol  (ULTRAM ) 50 MG tablet Take 50 mg by mouth every 6 (six) hours as needed.     zolpidem  (AMBIEN ) 10 MG tablet TAKE 1 TABLET (10 MG TOTAL) BY MOUTH AT  BEDTIME AS NEEDED FOR SLEEP 30 tablet 0   No current facility-administered medications for this visit.    Allergies  Allergen Reactions   Other Other (See Comments)   Prednisone Other (See Comments)    Mouth sores and thrush    Sulfa Antibiotics Other (See Comments) and Nausea And Vomiting    Unknown Stevens-Johnson Syndrome (affecting mucous membranes)   Prednazoline Other (See Comments)    Family History  Problem Relation Age of Onset   COPD Mother    Heart disease Mother    Stroke Mother    Heart disease Father    COPD Father    Cancer Father        throat   Healthy Brother    Drug abuse Brother    Healthy Son     Social History   Socioeconomic History   Marital status: Widowed    Spouse name: Not on file   Number of children: Not on file    Years of education: Not on file   Highest education level: GED or equivalent  Occupational History   Occupation: retired  Tobacco Use   Smoking status: Never   Smokeless tobacco: Never  Vaping Use   Vaping status: Never Used  Substance and Sexual Activity   Alcohol use: No   Drug use: No   Sexual activity: Not Currently  Other Topics Concern   Not on file  Social History Narrative   Patient is a 68 y/o widow (2011) who lives independently at home. 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 She has one son, a daughter-in-law and a grandson that live locally.  She struggles with her relationships with mother and brother.   Social Drivers of Health   Tobacco Use: Low Risk  (10/08/2024)   Received from [facility]   Patient History    Smoking Tobacco Use: Never    Smokeless Tobacco Use: Never    Passive Exposure: Not on file  Financial Resource Strain: Low Risk  (08/31/2024)   Received from [facility]   Overall Financial Resource Strain (CARDIA)    Difficulty of Paying Living Expenses: Not hard at all  Food Insecurity: No Food Insecurity (08/31/2024)   Received from [facility]   Epic    Within the past 12 months, you worried that your food would run out before you got the money to buy more.: Never true    Within the past 12 months, the food you bought just didn't last and you didn't have money to get more.: Never true  Transportation Needs: No Transportation Needs (08/31/2024)   Received from [facility] - Transportation    In the past 12 months, has lack of transportation kept you from medical appointments or from getting medications?: No    Lack of Transportation (Non-Medical): No  Physical Activity: Inactive (05/02/2024)   Exercise Vital Sign    Days of Exercise per Week: 0 days    Minutes of Exercise per Session: 0 min  Stress: No Stress Concern Present  (05/02/2024)   [employer] - Occupational Stress Questionnaire    Feeling of Stress: Not at all  Recent Concern: Stress - Stress Concern Present (03/16/2024)   [employer] - Occupational Stress Questionnaire    Feeling of Stress : Rather much  Social Connections: Moderately Isolated (05/02/2024)   Social Connection and Isolation Panel    Frequency  of Communication with Friends and Family: More than three times a week    Frequency of Social Gatherings with Friends and Family: Never    Attends Religious Services: More than 4 times per year    Active Member of [employer] or Organizations: No    Attends Banker Meetings: Never    Marital Status: Widowed  Intimate Partner Violence: Not At Risk (05/02/2024)   Epic    Fear of Current or Ex-Partner: No    Emotionally Abused: No    Physically Abused: No    Sexually Abused: No  Depression (PHQ2-9): Low Risk (05/02/2024)   Depression (PHQ2-9)    PHQ-2 Score: 3  Recent Concern: Depression (PHQ2-9) - High Risk (03/04/2024)   Depression (PHQ2-9)    PHQ-2 Score: 23  Alcohol Screen: Low Risk (05/02/2024)   Alcohol Screen    Last Alcohol Screening Score (AUDIT): 0  Housing: Low Risk  (09/19/2024)   Received from [facility]   Epic    In the last 12 months, was there a time when you were not able to pay the mortgage or rent on time?: No    In the past 12 months, how many times have you moved where you were living?: 0    At any time in the past 12 months, were you homeless or living in a shelter (including now)?: No  Utilities: Not At Risk (08/31/2024)   Received from [facility]   Epic    In the past 12 months has the electric, gas, oil, or water company threatened to shut off services in your home?: No  Health Literacy: Adequate Health Literacy (05/02/2024)   B1300 Health Literacy    Frequency of need for help with medical instructions: Never      Constitutional: Denies fever, malaise, fatigue headache or abrupt weight changes.  HEENT: Denies eye pain, eye redness, ear pain, ringing in the ears, wax buildup, runny nose, nasal congestion, bloody nose, or sore throat. Respiratory: Patient reports shortness of breath.  Denies difficulty breathing, cough or sputum production.   Cardiovascular: Denies chest pain, chest tightness, palpitations or swelling in the hands or feet.  Gastrointestinal: Denies abdominal pain, bloating, constipation, diarrhea or blood in the stool.  GU: Patient reports urinary frequency.  Denies urgency, pain with urination, burning sensation, blood in urine, odor or discharge. Musculoskeletal: Denies decrease in range of motion, difficulty with gait, muscle pain or joint pain and swelling.  Skin: Denies redness, rashes, lesions or ulcercations.  Neurological: Patient reports insomnia.  Denies dizziness, difficulty with memory, difficulty with speech or problems with balance and coordination.  Psych: Patient has a history of anxiety and depression.  Denies SI/HI.  No other specific complaints in a complete review of systems (except as listed in HPI above).  Objective:   Physical Exam BP 132/84 (BP Location: Left Wrist, Patient Position: Sitting, Cuff Size: Normal)   Ht 5' 2 (1.575 m)   Wt 261 lb 3.2 oz (118.5 kg)   BMI 47.77 kg/m     Wt Readings from Last 3 Encounters:  08/05/24 266 lb (120.7 kg)  05/15/24 264 lb (119.7 kg)  05/12/24 265 lb 3.2 oz (120.3 kg)    General: Appears her stated age, obese, in NAD. Skin: Warm, dry and intact. No ulcerations noted. HEENT: Head: normal shape and size; Eyes: sclera white PERRLA and EOMs intact;  Neck:  Neck supple, trachea midline. No masses, lumps or thyromegaly present.  Cardiovascular: Normal rate and rhythm.  S1,S2 noted.  No murmur, rubs or gallops noted. No JVD or BLE edema. No carotid bruits noted. Pulmonary/Chest: Normal effort and positive vesicular  breath sounds. No respiratory distress. No wheezes, rales or ronchi noted.  Abdomen: Normal bowel sounds.  Musculoskeletal: Gait slow and steady without device.  Neurological: Alert and oriented.  Psychiatric: Mood and affect normal. Behavior is normal. Judgment and thought content normal.     BMET    Component Value Date/Time   NA 137 05/15/2024 1734   NA 138 11/14/2019 1451   NA 140 01/17/2015 1431   K 3.9 05/15/2024 1734   K 4.5 01/17/2015 1431   CL 105 05/15/2024 1734   CL 104 01/17/2015 1431   CO2 20 (L) 05/15/2024 1734   CO2 29 01/17/2015 1431   GLUCOSE 145 (H) 05/15/2024 1734   GLUCOSE 107 (H) 01/17/2015 1431   BUN 13 05/15/2024 1734   BUN 13 11/14/2019 1451   BUN 13 01/17/2015 1431   CREATININE 1.09 (H) 05/15/2024 1734   CREATININE 0.93 03/17/2024 1505   CALCIUM  9.5 05/15/2024 1734   CALCIUM  10.2 01/17/2015 1431   GFRNONAA 55 (L) 05/15/2024 1734   GFRNONAA >60 01/17/2015 1431   GFRAA >60 03/19/2020 0022   GFRAA >60 01/17/2015 1431    Lipid Panel     Component Value Date/Time   CHOL 134 03/17/2024 1505   TRIG 135 03/17/2024 1505   HDL 59 03/17/2024 1505   CHOLHDL 2.3 03/17/2024 1505   VLDL 31 (H) 05/31/2017 0837   LDLCALC 53 03/17/2024 1505    CBC    Component Value Date/Time   WBC 13.0 (H) 05/15/2024 1734   RBC 5.05 05/15/2024 1734   HGB 15.2 (H) 05/15/2024 1734   HGB 15.3 11/14/2019 1451   HCT 46.5 (H) 05/15/2024 1734   HCT 44.6 11/14/2019 1451   PLT 319 05/15/2024 1734   PLT 343 11/14/2019 1451   MCV 92.1 05/15/2024 1734   MCV 88 11/14/2019 1451   MCV 89 01/17/2015 1431   MCH 30.1 05/15/2024 1734   MCHC 32.7 05/15/2024 1734   RDW 13.7 05/15/2024 1734   RDW 13.3 11/14/2019 1451   RDW 13.5 01/17/2015 1431   LYMPHSABS 1.9 11/14/2019 1451   MONOABS 528 05/31/2017 0837   EOSABS 0.2 11/14/2019 1451   BASOSABS 0.0 11/14/2019 1451    Hgb A1C Lab Results  Component Value Date   HGBA1C 6.0 (H) 03/17/2024           Assessment & Plan:     RTC in 6 months for your annual exam Angeline Laura, NP   "

## 2024-10-14 ENCOUNTER — Encounter: Payer: Self-pay | Admitting: Internal Medicine

## 2024-10-14 ENCOUNTER — Ambulatory Visit (INDEPENDENT_AMBULATORY_CARE_PROVIDER_SITE_OTHER): Admitting: Internal Medicine

## 2024-10-14 VITALS — BP 132/84 | Ht 62.0 in | Wt 261.2 lb

## 2024-10-14 DIAGNOSIS — I700 Atherosclerosis of aorta: Secondary | ICD-10-CM | POA: Diagnosis not present

## 2024-10-14 DIAGNOSIS — I4719 Other supraventricular tachycardia: Secondary | ICD-10-CM

## 2024-10-14 DIAGNOSIS — N3281 Overactive bladder: Secondary | ICD-10-CM | POA: Diagnosis not present

## 2024-10-14 DIAGNOSIS — I10 Essential (primary) hypertension: Secondary | ICD-10-CM

## 2024-10-14 DIAGNOSIS — F419 Anxiety disorder, unspecified: Secondary | ICD-10-CM | POA: Diagnosis not present

## 2024-10-14 DIAGNOSIS — F3341 Major depressive disorder, recurrent, in partial remission: Secondary | ICD-10-CM

## 2024-10-14 DIAGNOSIS — E782 Mixed hyperlipidemia: Secondary | ICD-10-CM

## 2024-10-14 DIAGNOSIS — F5104 Psychophysiologic insomnia: Secondary | ICD-10-CM

## 2024-10-14 DIAGNOSIS — K219 Gastro-esophageal reflux disease without esophagitis: Secondary | ICD-10-CM | POA: Diagnosis not present

## 2024-10-14 DIAGNOSIS — I479 Paroxysmal tachycardia, unspecified: Secondary | ICD-10-CM | POA: Insufficient documentation

## 2024-10-14 DIAGNOSIS — E039 Hypothyroidism, unspecified: Secondary | ICD-10-CM

## 2024-10-14 DIAGNOSIS — R7303 Prediabetes: Secondary | ICD-10-CM

## 2024-10-14 NOTE — Assessment & Plan Note (Signed)
 Complicated by morbid obesity Try to avoid foods that trigger reflux Encourage weight loss as this can help reduce reflux symptoms Continue omeprazole 20 mg daily

## 2024-10-14 NOTE — Assessment & Plan Note (Signed)
 Complicated by morbid obesity C-Met and lipid profile today Continue atorvastatin  20 mg and aspirin  81 mg daily

## 2024-10-14 NOTE — Assessment & Plan Note (Signed)
 Not currently on beta-blocker She will continue to follow with cardiology

## 2024-10-14 NOTE — Assessment & Plan Note (Signed)
 Continue oxybutynin  15 mg XL daily, failed previous wean attempts Encourage kegel exercises and timed voiding

## 2024-10-14 NOTE — Assessment & Plan Note (Signed)
 Continue zolpidem  10 mg daily as needed We will monitor use

## 2024-10-14 NOTE — Assessment & Plan Note (Signed)
 Not medicated We will monitor

## 2024-10-14 NOTE — Assessment & Plan Note (Signed)
 Complicated by morbid obesity A1c today Encourage low-carb diet and exercise for weight loss

## 2024-10-14 NOTE — Patient Instructions (Signed)

## 2024-10-14 NOTE — Assessment & Plan Note (Signed)
 TSH and free T4 today Not currently medicated

## 2024-10-14 NOTE — Assessment & Plan Note (Signed)
 Complicated by morbid obesity Continue olmesartan  10 mg daily Reinforced DASH diet and exercise for weight loss C-Met today

## 2024-10-14 NOTE — Assessment & Plan Note (Signed)
Not medicated Support offered 

## 2024-10-14 NOTE — Assessment & Plan Note (Signed)
 Encouraged diet and exercise for weight loss ?

## 2024-10-14 NOTE — Assessment & Plan Note (Signed)
 Complicated by morbid obesity C-Met and lipid profile today Continue atorvastatin  20 mg daily Encouraged her to consume a low-fat diet

## 2024-10-15 ENCOUNTER — Ambulatory Visit: Payer: Self-pay | Admitting: Internal Medicine

## 2024-10-15 LAB — COMPREHENSIVE METABOLIC PANEL WITH GFR
AG Ratio: 1.5 (calc) (ref 1.0–2.5)
ALT: 11 U/L (ref 6–29)
AST: 22 U/L (ref 10–35)
Albumin: 4.5 g/dL (ref 3.6–5.1)
Alkaline phosphatase (APISO): 127 U/L (ref 37–153)
BUN: 10 mg/dL (ref 7–25)
CO2: 25 mmol/L (ref 20–32)
Calcium: 9.7 mg/dL (ref 8.6–10.4)
Chloride: 103 mmol/L (ref 98–110)
Creat: 0.81 mg/dL (ref 0.50–1.05)
Globulin: 3 g/dL (ref 1.9–3.7)
Glucose, Bld: 124 mg/dL — ABNORMAL HIGH (ref 65–99)
Potassium: 3.9 mmol/L (ref 3.5–5.3)
Sodium: 140 mmol/L (ref 135–146)
Total Bilirubin: 0.7 mg/dL (ref 0.2–1.2)
Total Protein: 7.5 g/dL (ref 6.1–8.1)
eGFR: 79 mL/min/1.73m2

## 2024-10-15 LAB — T4, FREE: Free T4: 1.1 ng/dL (ref 0.8–1.8)

## 2024-10-15 LAB — CBC
HCT: 47 % — ABNORMAL HIGH (ref 35.9–46.0)
Hemoglobin: 15.4 g/dL (ref 11.7–15.5)
MCH: 30.1 pg (ref 27.0–33.0)
MCHC: 32.8 g/dL (ref 31.6–35.4)
MCV: 92 fL (ref 81.4–101.7)
MPV: 10.6 fL (ref 7.5–12.5)
Platelets: 347 Thousand/uL (ref 140–400)
RBC: 5.11 Million/uL — ABNORMAL HIGH (ref 3.80–5.10)
RDW: 12.2 % (ref 11.0–15.0)
WBC: 8.4 Thousand/uL (ref 3.8–10.8)

## 2024-10-15 LAB — HEMOGLOBIN A1C
Hgb A1c MFr Bld: 6 % — ABNORMAL HIGH
Mean Plasma Glucose: 126 mg/dL
eAG (mmol/L): 7 mmol/L

## 2024-10-15 LAB — LIPID PANEL
Cholesterol: 142 mg/dL
HDL: 59 mg/dL
LDL Cholesterol (Calc): 61 mg/dL
Non-HDL Cholesterol (Calc): 83 mg/dL
Total CHOL/HDL Ratio: 2.4 (calc)
Triglycerides: 136 mg/dL

## 2024-10-15 LAB — TSH: TSH: 2.48 m[IU]/L (ref 0.40–4.50)

## 2024-10-22 ENCOUNTER — Ambulatory Visit: Admitting: Student

## 2024-10-22 ENCOUNTER — Encounter: Payer: Self-pay | Admitting: Internal Medicine

## 2024-10-22 ENCOUNTER — Ambulatory Visit: Payer: Self-pay

## 2024-10-22 NOTE — Telephone Encounter (Signed)
 FYI Only or Action Required?: FYI only for provider: appointment scheduled on 10/23/24.  Patient was last seen in primary care on 10/14/2024 by Antonette Angeline ORN, NP.  Called Nurse Triage reporting Urinary Tract Infection.   Triage Disposition: See Physician Within 24 Hours  Patient/caregiver understands and will follow disposition?: Yes  Patient missed appointment scheduled for 10/22/24. She called into triage to reschedule. Her new appt. Is for 1/8 with PCP.                 Copied from CRM [id number]. Topic: Clinical - Pink Word Triage >> Oct 22, 2024  9:30 AM Donna BRAVO wrote: [place] Word triggered transfer to Nurse Triage. See Triage Message for details. 3 UTI since April  -burning when urinating >> Oct 22, 2024  1:58 PM Robinson H wrote: Patient missed appointment scheduled by triage for today at 1:40, wants to reschedule >> Oct 22, 2024  9:31 AM Donna BRAVO wrote: Reason for Triage:  3 UTI since April  -burning when urinating

## 2024-10-22 NOTE — Telephone Encounter (Signed)
 FYI Only or Action Required?: FYI only for provider: appointment scheduled on 10/22/24.  Patient was last seen in primary care on 10/14/2024 by Antonette Angeline ORN, NP.  Called Nurse Triage reporting Urinary Tract Infection.  Symptoms began a week ago.  Interventions attempted: Nothing.  Symptoms are: unchanged.  Triage Disposition: See Physician Within 24 Hours  Patient/caregiver understands and will follow disposition?: Yes   Message from [facility] E sent at 10/22/2024  9:31 AM EST  Summary: 3 UTI since April  -burning when urinating   Reason for Triage: 3 UTI since April -burning when urinating         Reason for Disposition  Age > 50 years  Answer Assessment - Initial Assessment Questions No available appts with pcp today. Scheduled 10/22/24. Advised call back or ED/911 if symptoms worsen. Patient verbalized understanding.    1. SEVERITY: How bad is the pain?  (e.g., Scale 1-10; mild, moderate, or severe)     5/10; lower abd pain 2. FREQUENCY: How many times have you had painful urination today?      no 3. PATTERN: Is pain present every time you urinate or just sometimes?      With urination 4. ONSET: When did the painful urination start?      6 days ago 5. FEVER: Do you have a fever? If Yes, ask: What is your temperature, how was it measured, and when did it start?     Denies fever chills, n/v 6. PAST UTI: Have you had a urine infection before? If Yes, ask: When was the last time? and What happened that time?      yes 7. CAUSE: What do you think is causing the painful urination?  (e.g., UTI, scratch, Herpes sore)     uti 8. OTHER SYMPTOMS: Do you have any other symptoms? (e.g., blood in urine, flank pain, genital sores, urgency, vaginal discharge)     Denies odor, blood, back pain  Protocols used: Urination Pain - Female-A-AH

## 2024-10-23 ENCOUNTER — Encounter: Payer: Self-pay | Admitting: Internal Medicine

## 2024-10-23 ENCOUNTER — Ambulatory Visit: Admitting: Internal Medicine

## 2024-10-23 VITALS — BP 132/84 | Ht 62.0 in | Wt 260.0 lb

## 2024-10-23 DIAGNOSIS — R300 Dysuria: Secondary | ICD-10-CM | POA: Diagnosis not present

## 2024-10-23 DIAGNOSIS — N3000 Acute cystitis without hematuria: Secondary | ICD-10-CM | POA: Diagnosis not present

## 2024-10-23 DIAGNOSIS — R0982 Postnasal drip: Secondary | ICD-10-CM | POA: Diagnosis not present

## 2024-10-23 LAB — POCT URINE DIPSTICK
Bilirubin, UA: NEGATIVE
Glucose, UA: NEGATIVE mg/dL
Ketones, POC UA: NEGATIVE mg/dL
Nitrite, UA: POSITIVE — AB
Spec Grav, UA: 1.01
Urobilinogen, UA: 0.2 U/dL
pH, UA: 6

## 2024-10-23 MED ORDER — NITROFURANTOIN MONOHYD MACRO 100 MG PO CAPS: 100.0000 mg | ORAL_CAPSULE | Freq: Two times a day (BID) | ORAL | 0 refills | Status: AC

## 2024-10-23 NOTE — Progress Notes (Signed)
 "  Subjective:    Patient ID: Denise Glass, female    DOB: Feb 28, 1956, 69 y.o.   MRN: [id number]  HPI  Discussed the use of AI scribe software for clinical note transcription with the patient, who gave verbal consent to proceed.  Denise Glass is a 69 year old female who presents with symptoms of a urinary tract infection and dry mouth.  She experiences a burning sensation during urination, particularly at the start and stop, without increased frequency, urgency, bladder pressure, or low back pain. She has had three urinary tract infections since April, with the last urine culture in June showing E. coli sensitive to treatment. She recalls a previous medication being too strong and causing adverse effects.  She has had a dry mouth and scratchy throat for the past week. No headaches, runny nose, nasal congestion, ear pain, cough, shortness of breath, fever, chills, body aches, nausea, vomiting, or diarrhea. She mentions not being a 'water person' but has been drinking more water recently.  She has tried Benadryl  OTC with minimal relief of symptoms.       Review of Systems  Past Medical History:  Diagnosis Date   Complication of anesthesia    pt reports her appetite takes 2-3 weeks to come back after anesthesia.   Depression    DJD (degenerative joint disease)    GERD (gastroesophageal reflux disease)    Headache 2016   cluster head aches, Head aches have subsided.   Hypothyroidism    Insomnia    Mitral valve prolapse 2003   Followed by Dr. Florencio (prn)   PONV (postoperative nausea and vomiting) 2002   With BTL   Urine frequency     Current Outpatient Medications  Medication Sig Dispense Refill   aspirin  EC 81 MG tablet Take 1 tablet (81 mg total) by mouth daily. Swallow whole.     atorvastatin  (LIPITOR) 20 MG tablet Take 1 tablet (20 mg total) by mouth daily. 90 tablet 3   diphenhydrAMINE  HCl (BENADRYL  ALLERGY PO) Take by mouth.     ibuprofen (ADVIL) 200 MG tablet Take by  mouth.     olmesartan  (BENICAR ) 20 MG tablet Take 0.5 tablets (10 mg total) by mouth daily. 45 tablet 1   omeprazole (PRILOSEC) 20 MG capsule Take by mouth.     ondansetron  (ZOFRAN -ODT) 4 MG disintegrating tablet Take 1 tablet (4 mg total) by mouth every 8 (eight) hours as needed for nausea or vomiting. 30 tablet 0   oxybutynin  (DITROPAN  XL) 15 MG 24 hr tablet TAKE 1 TABLET BY MOUTH EVERYDAY AT BEDTIME 90 tablet 1   progesterone (PROMETRIUM) 200 MG capsule Take 200 mg by mouth daily.     traMADol  (ULTRAM ) 50 MG tablet Take 50 mg by mouth every 6 (six) hours as needed.     zolpidem  (AMBIEN ) 10 MG tablet TAKE 1 TABLET (10 MG TOTAL) BY MOUTH AT BEDTIME AS NEEDED FOR SLEEP 30 tablet 0   No current facility-administered medications for this visit.    Allergies  Allergen Reactions   Other Other (See Comments)   Prednisone Other (See Comments)    Mouth sores and thrush    Sulfa Antibiotics Other (See Comments) and Nausea And Vomiting    Unknown Stevens-Johnson Syndrome (affecting mucous membranes)   Prednazoline Other (See Comments)    Family History  Problem Relation Age of Onset   COPD Mother    Heart disease Mother    Stroke Mother    Heart disease Father  COPD Father    Cancer Father        throat   Healthy Brother    Drug abuse Brother    Healthy Son     Social History   Socioeconomic History   Marital status: Widowed    Spouse name: Not on file   Number of children: Not on file   Years of education: Not on file   Highest education level: GED or equivalent  Occupational History   Occupation: retired  Tobacco Use   Smoking status: Never   Smokeless tobacco: Never  Vaping Use   Vaping status: Never Used  Substance and Sexual Activity   Alcohol use: No   Drug use: No   Sexual activity: Not Currently  Other Topics Concern   Not on file  Social History Narrative   Patient is a 69 y/o widow (2011) who lives independently at home. 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 She has one son, a daughter-in-law and a grandson that live locally.  She struggles with her relationships with mother and brother.   Social Drivers of Health   Tobacco Use: Low Risk (10/14/2024)   Patient History    Smoking Tobacco Use: Never    Smokeless Tobacco Use: Never    Passive Exposure: Not on file  Financial Resource Strain: Low Risk  (08/31/2024)   Received from [facility]   Overall Financial Resource Strain (CARDIA)    Difficulty of Paying Living Expenses: Not hard at all  Food Insecurity: No Food Insecurity (08/31/2024)   Received from [facility]   Epic    Within the past 12 months, you worried that your food would run out before you got the money to buy more.: Never true    Within the past 12 months, the food you bought just didn't last and you didn't have money to get more.: Never true  Transportation Needs: No Transportation Needs (08/31/2024)   Received from [facility] - Transportation    In the past 12 months, has lack of transportation kept you from medical appointments or from getting medications?: No    Lack of Transportation (Non-Medical): No  Physical Activity: Inactive (05/02/2024)   Exercise Vital Sign    Days of Exercise per Week: 0 days    Minutes of Exercise per Session: 0 min  Stress: No Stress Concern Present (05/02/2024)   [employer] - Occupational Stress Questionnaire    Feeling of Stress: Not at all  Recent Concern: Stress - Stress Concern Present (03/16/2024)   [employer] - Occupational Stress Questionnaire    Feeling of Stress : Rather much  Social Connections: Moderately Isolated (05/02/2024)   Social Connection and Isolation Panel    Frequency of Communication with Friends and Family: More than three times a week    Frequency of Social Gatherings with Friends and Family: Never     Attends Religious Services: More than 4 times per year    Active Member of [employer] or Organizations: No    Attends Banker Meetings: Never    Marital Status: Widowed  Intimate Partner Violence: Not At Risk (05/02/2024)   Epic    Fear of Current or Ex-Partner: No    Emotionally Abused: No    Physically Abused: No    Sexually Abused: No  Depression (PHQ2-9): Low Risk (10/14/2024)   Depression (PHQ2-9)    PHQ-2 Score:  0  Alcohol Screen: Low Risk (05/02/2024)   Alcohol Screen    Last Alcohol Screening Score (AUDIT): 0  Housing: Low Risk  (09/19/2024)   Received from [facility]   Epic    In the last 12 months, was there a time when you were not able to pay the mortgage or rent on time?: No    In the past 12 months, how many times have you moved where you were living?: 0    At any time in the past 12 months, were you homeless or living in a shelter (including now)?: No  Utilities: Not At Risk (08/31/2024)   Received from [facility]   Epic    In the past 12 months has the electric, gas, oil, or water company threatened to shut off services in your home?: No  Health Literacy: Adequate Health Literacy (05/02/2024)   B1300 Health Literacy    Frequency of need for help with medical instructions: Never     Constitutional: Denies fever, malaise, fatigue, headache or abrupt weight changes.  HEENT: Pt reports scratchy throat, dry mouth. Denies eye pain, eye redness, runny nose, nasal congestion or ear pain. Respiratory: Denies difficulty breathing, shortness of breath, cough or sputum production.   Cardiovascular: Denies chest pain, chest tightness, palpitations or swelling in the hands or feet.  Gastrointestinal: Patient reports LLQ pressure, nausea.  Denies bloating, constipation, diarrhea or blood in the stool.  GU: Pt reports burning with urination. Denies urgency, frequency, pain with urination, burning sensation, blood in urine, odor or  discharge. Musculoskeletal: Patient reports left hip pain.  Denies decrease in range of motion, difficulty with gait, muscle pain or joint swelling.   No other specific complaints in a complete review of systems (except as listed in HPI above).     Objective:   Physical Exam  BP 132/84 (BP Location: Left Wrist, Patient Position: Sitting, Cuff Size: Normal)   Ht 5' 2 (1.575 m)   Wt 260 lb (117.9 kg)   BMI 47.55 kg/m       Wt Readings from Last 3 Encounters:  10/14/24 261 lb 3.2 oz (118.5 kg)  08/05/24 266 lb (120.7 kg)  05/15/24 264 lb (119.7 kg)    General: Appears her stated age, obese, in NAD. HENNT: Throat: Mucosa pink and moist, + PND, tonsillar enlargement or exudate noted Neck: No adenopathy noted. Cardiovascular: Normal rate and rhythm.  Pulmonary/Chest: Normal effort and positive vesicular breath sounds. No respiratory distress. No wheezes, rales or ronchi noted.  Abdomen: Soft, nontender over the bladder.  Normal bowel sounds.  No CVA tenderness noted. MSK: Gait slow and steady without device. Neurological: Alert and oriented.   BMET    Component Value Date/Time   NA 140 10/14/2024 1426   NA 138 11/14/2019 1451   NA 140 01/17/2015 1431   K 3.9 10/14/2024 1426   K 4.5 01/17/2015 1431   CL 103 10/14/2024 1426   CL 104 01/17/2015 1431   CO2 25 10/14/2024 1426   CO2 29 01/17/2015 1431   GLUCOSE 124 (H) 10/14/2024 1426   GLUCOSE 107 (H) 01/17/2015 1431   BUN 10 10/14/2024 1426   BUN 13 11/14/2019 1451   BUN 13 01/17/2015 1431   CREATININE 0.81 10/14/2024 1426   CALCIUM  9.7 10/14/2024 1426   CALCIUM  10.2 01/17/2015 1431   GFRNONAA 55 (L) 05/15/2024 1734   GFRNONAA >60 01/17/2015 1431   GFRAA >60 03/19/2020 0022   GFRAA >60 01/17/2015 1431  Lipid Panel     Component Value Date/Time   CHOL 142 10/14/2024 1426   TRIG 136 10/14/2024 1426   HDL 59 10/14/2024 1426   CHOLHDL 2.4 10/14/2024 1426   VLDL 31 (H) 05/31/2017 0837   LDLCALC 61 10/14/2024  1426    CBC    Component Value Date/Time   WBC 8.4 10/14/2024 1426   RBC 5.11 (H) 10/14/2024 1426   HGB 15.4 10/14/2024 1426   HGB 15.3 11/14/2019 1451   HCT 47.0 (H) 10/14/2024 1426   HCT 44.6 11/14/2019 1451   PLT 347 10/14/2024 1426   PLT 343 11/14/2019 1451   MCV 92.0 10/14/2024 1426   MCV 88 11/14/2019 1451   MCV 89 01/17/2015 1431   MCH 30.1 10/14/2024 1426   MCHC 32.8 10/14/2024 1426   RDW 12.2 10/14/2024 1426   RDW 13.3 11/14/2019 1451   RDW 13.5 01/17/2015 1431   LYMPHSABS 1.9 11/14/2019 1451   MONOABS 528 05/31/2017 0837   EOSABS 0.2 11/14/2019 1451   BASOSABS 0.0 11/14/2019 1451    Hgb A1C Lab Results  Component Value Date   HGBA1C 6.0 (H) 10/14/2024           Assessment & Plan:    Assessment and Plan    Acute cystitis Recurrent UTIs with dysuria, urinalysis indicates infection.  Moderate leuks, positive nitrites and trace blood.  Previous culture showed E. coli sensitive to antibiotics. - Prescribed Macrobid  100 mg twice daily for 5 days. - Advised adequate hydration. - Consider urology referral if UTIs persist.  Postnasal drip Scratchy throat likely due to postnasal drip, Benadryl  may cause dry mouth. - Discontinued Benadryl . - Recommended Zyrtec, Claritin, or Allegra for relief.         RTC in 5 months for your annual exam Angeline Laura, NP  "

## 2024-10-23 NOTE — Telephone Encounter (Signed)
 Will discuss at upcoming appointment.

## 2024-10-23 NOTE — Patient Instructions (Signed)

## 2024-10-25 LAB — URINE CULTURE
MICRO NUMBER:: 17443761
SPECIMEN QUALITY:: ADEQUATE

## 2024-10-27 ENCOUNTER — Ambulatory Visit: Payer: Self-pay | Admitting: Internal Medicine

## 2024-11-19 ENCOUNTER — Encounter: Payer: Self-pay | Admitting: Pharmacist

## 2024-11-19 NOTE — Progress Notes (Signed)
" ° ° ° °  11/19/2024 Name: Denise Glass MRN: [id number] DOB: Apr 29, 1956  This patient is appearing on a report for the adherence measure for cholesterol (statin) medications this calendar year.   Medication: atorvastatin  20 mg  Last fill date: 08/25/2024 for 90 day supply  Reach patient by telephone today.  Discuss importance of medication adherence. Patient attributes gaps in refills of atorvastatin  in 2025 to going through a challenging time in her life.  Reports now back to using weekly pillbox and denies missed doses.  Patient denies further medication questions or concerns today Patient encouraged to contact clinic pharmacist to contact if needed in future for medication questions/concerns   Sharyle Sia, PharmD, [facility] [phone]  "

## 2025-03-20 ENCOUNTER — Encounter: Admitting: Internal Medicine

## 2025-05-08 ENCOUNTER — Ambulatory Visit

## 2025-05-13 ENCOUNTER — Ambulatory Visit
